# Patient Record
Sex: Female | Born: 1970
Health system: Southern US, Community
[De-identification: ages and names within clinical notes are randomized; demographics above are authoritative.]

## PROBLEM LIST (undated history)

## (undated) DIAGNOSIS — F32A Depression, unspecified: Secondary | ICD-10-CM

## (undated) DIAGNOSIS — I8390 Asymptomatic varicose veins of unspecified lower extremity: Secondary | ICD-10-CM

## (undated) DIAGNOSIS — Z8659 Personal history of other mental and behavioral disorders: Secondary | ICD-10-CM

## (undated) DIAGNOSIS — Z87820 Personal history of traumatic brain injury: Secondary | ICD-10-CM

## (undated) DIAGNOSIS — Z87898 Personal history of other specified conditions: Secondary | ICD-10-CM

## (undated) DIAGNOSIS — F329 Major depressive disorder, single episode, unspecified: Secondary | ICD-10-CM

## (undated) DIAGNOSIS — F419 Anxiety disorder, unspecified: Secondary | ICD-10-CM

## (undated) DIAGNOSIS — A048 Other specified bacterial intestinal infections: Secondary | ICD-10-CM

## (undated) DIAGNOSIS — N946 Dysmenorrhea, unspecified: Secondary | ICD-10-CM

## (undated) DIAGNOSIS — Z8719 Personal history of other diseases of the digestive system: Secondary | ICD-10-CM

## (undated) HISTORY — PX: TRANSTHORACIC ECHOCARDIOGRAM: SHX275

---

## 2000-06-04 HISTORY — PX: LAPAROSCOPIC OVARIAN CYSTECTOMY: SUR786

## 2002-04-16 ENCOUNTER — Inpatient Hospital Stay (HOSPITAL_COMMUNITY): Admission: AD | Admit: 2002-04-16 | Discharge: 2002-04-16 | Payer: Self-pay | Admitting: *Deleted

## 2002-12-14 ENCOUNTER — Encounter: Admission: RE | Admit: 2002-12-14 | Discharge: 2002-12-14 | Payer: Self-pay | Admitting: Family Medicine

## 2002-12-14 ENCOUNTER — Encounter: Payer: Self-pay | Admitting: Family Medicine

## 2004-12-26 ENCOUNTER — Other Ambulatory Visit: Admission: RE | Admit: 2004-12-26 | Discharge: 2004-12-26 | Payer: Self-pay | Admitting: Obstetrics and Gynecology

## 2007-01-02 ENCOUNTER — Encounter: Admission: RE | Admit: 2007-01-02 | Discharge: 2007-01-02 | Payer: Self-pay | Admitting: Gastroenterology

## 2007-06-05 HISTORY — PX: BUNIONECTOMY: SHX129

## 2008-07-22 ENCOUNTER — Encounter: Admission: RE | Admit: 2008-07-22 | Discharge: 2008-07-22 | Payer: Self-pay | Admitting: Podiatry

## 2009-02-15 ENCOUNTER — Encounter: Admission: RE | Admit: 2009-02-15 | Discharge: 2009-02-15 | Payer: Self-pay | Admitting: Podiatry

## 2010-03-20 ENCOUNTER — Encounter: Admission: RE | Admit: 2010-03-20 | Discharge: 2010-03-20 | Payer: Self-pay | Admitting: Family Medicine

## 2010-03-29 ENCOUNTER — Ambulatory Visit (HOSPITAL_COMMUNITY): Admission: RE | Admit: 2010-03-29 | Discharge: 2010-03-29 | Payer: Self-pay | Admitting: Family Medicine

## 2010-05-11 ENCOUNTER — Emergency Department (HOSPITAL_COMMUNITY): Admission: EM | Admit: 2010-05-11 | Discharge: 2010-03-24 | Payer: Self-pay | Admitting: Emergency Medicine

## 2010-08-16 LAB — DIFFERENTIAL
Basophils Absolute: 0 10*3/uL (ref 0.0–0.1)
Basophils Relative: 0 % (ref 0–1)
Eosinophils Absolute: 0.2 10*3/uL (ref 0.0–0.7)
Eosinophils Relative: 1 % (ref 0–5)
Lymphocytes Relative: 5 % — ABNORMAL LOW (ref 12–46)
Lymphs Abs: 1.2 10*3/uL (ref 0.7–4.0)
Monocytes Absolute: 1 10*3/uL (ref 0.1–1.0)
Monocytes Relative: 5 % (ref 3–12)
Neutro Abs: 19.5 10*3/uL — ABNORMAL HIGH (ref 1.7–7.7)
Neutrophils Relative %: 89 % — ABNORMAL HIGH (ref 43–77)

## 2010-08-16 LAB — COMPREHENSIVE METABOLIC PANEL
ALT: 23 U/L (ref 0–35)
AST: 19 U/L (ref 0–37)
Albumin: 4.5 g/dL (ref 3.5–5.2)
Alkaline Phosphatase: 48 U/L (ref 39–117)
BUN: 11 mg/dL (ref 6–23)
CO2: 26 mEq/L (ref 19–32)
Calcium: 9.5 mg/dL (ref 8.4–10.5)
Chloride: 106 mEq/L (ref 96–112)
Creatinine, Ser: 0.99 mg/dL (ref 0.4–1.2)
GFR calc Af Amer: >60 mL/min (ref >60)
GFR calc non Af Amer: >60 mL/min (ref >60)
Glucose, Bld: 138 mg/dL — ABNORMAL HIGH (ref 70–99)
Potassium: 4.1 mEq/L (ref 3.5–5.1)
Sodium: 139 mEq/L (ref 135–145)
Total Bilirubin: 0.6 mg/dL (ref 0.3–1.2)
Total Protein: 7.4 g/dL (ref 6.0–8.3)

## 2010-08-16 LAB — URINALYSIS, ROUTINE W REFLEX MICROSCOPIC
Bilirubin Urine: NEGATIVE
Glucose, UA: NEGATIVE mg/dL
Ketones, ur: NEGATIVE mg/dL
Leukocytes, UA: NEGATIVE
Nitrite: NEGATIVE
Protein, ur: NEGATIVE mg/dL
Specific Gravity, Urine: 1.009 (ref 1.005–1.030)
Urobilinogen, UA: 0.2 mg/dL (ref 0.0–1.0)
pH: 7 (ref 5.0–8.0)

## 2010-08-16 LAB — CBC
HCT: 43.2 % (ref 36.0–46.0)
Hemoglobin: 15.3 g/dL — ABNORMAL HIGH (ref 12.0–15.0)
MCH: 32 pg (ref 26.0–34.0)
MCHC: 35.5 g/dL (ref 30.0–36.0)
MCV: 90.1 fL (ref 78.0–100.0)
Platelets: 270 10*3/uL (ref 150–400)
RBC: 4.8 MIL/uL (ref 3.87–5.11)
RDW: 12.2 % (ref 11.5–15.5)
WBC: 21.8 10*3/uL — ABNORMAL HIGH (ref 4.0–10.5)

## 2010-08-16 LAB — URINE MICROSCOPIC-ADD ON

## 2010-08-16 LAB — POCT PREGNANCY, URINE: Preg Test, Ur: NEGATIVE

## 2010-08-16 LAB — LIPASE, BLOOD: Lipase: 31 U/L (ref 11–59)

## 2011-01-16 ENCOUNTER — Encounter: Payer: Self-pay | Admitting: Vascular Surgery

## 2011-01-24 ENCOUNTER — Ambulatory Visit (INDEPENDENT_AMBULATORY_CARE_PROVIDER_SITE_OTHER): Payer: 59 | Admitting: Vascular Surgery

## 2011-01-24 ENCOUNTER — Encounter: Payer: Self-pay | Admitting: Vascular Surgery

## 2011-01-24 ENCOUNTER — Ambulatory Visit (INDEPENDENT_AMBULATORY_CARE_PROVIDER_SITE_OTHER): Payer: 59

## 2011-01-24 VITALS — BP 128/71 | HR 71 | Resp 18 | Ht 66.0 in | Wt 169.0 lb

## 2011-01-24 DIAGNOSIS — I8310 Varicose veins of unspecified lower extremity with inflammation: Secondary | ICD-10-CM

## 2011-01-24 DIAGNOSIS — I83893 Varicose veins of bilateral lower extremities with other complications: Secondary | ICD-10-CM

## 2011-01-24 NOTE — Progress Notes (Signed)
Subjective:     Patient ID: Taylor Knight, female   DOB: 09-Feb-1971, 40 y.o.   MRN: [id number]  HPI The she reports that the pain is worse with prolonged standing and she has an akinesis sensation specifically over the veins patient presents with complaint of pain in her right leg related to varicose veins. These varicosities had been present for many years and over the past several months have become more painful. This is particularly painful in her right medial thigh and right medial calf. She has no history of DVT or bleeding. She has no history of superficial thrombophlebitis. She reports this is worse with prolonged standing and is specifically sensitive over the veins in her medial thigh. She has worn knee-high support hose with no relief. She does elevate her legs when possible.  Review of Systems Positive for headache, nosebleed otherwise review of systems is negative    Objective:   Physical Exam Well-developed well-nourished white female no acute distress HEENT is normal pulse status 2+ radial and dorsalis pedis pulses. Musculoskeletal no major deformities or cyanosis. Neurologic, no focal weakness paresthesias. Skin: No ulcers or rashes. Marked varicosities in the right medial thigh and medial calf.  Largely venous duplex exam: Reflux and enlarged a great saphenous vein from the saphenofemoral junction distally below the knee on the right and to above the knee on the left. No significant deep venous reflux.    Assessment:     Symptomatic right leg varicose veins related to saphenous vein reflux. Patient is fitted today with thigh high 20-30 mm mercury graduated compression garments.    Plan:     The patient understands treatment plan with elevation ibuprofen and compression. She will be seen again to determine if this is effective treatment in 3 months. She would be a candidate for laser ablation of her right great saphenous vein and stab phlebectomy of tributary varicosities should she  failed conservative treatment.

## 2011-01-30 NOTE — Procedures (Unsigned)
LOWER EXTREMITY VENOUS REFLUX EXAM  INDICATION:  Bilateral varicose veins with pain.  EXAM:  Using color-flow imaging and pulse Doppler spectral analysis, the right and left common femoral, superficial femoral, popliteal, posterior tibial, greater and lesser saphenous veins are evaluated.  There is evidence suggesting deep venous insufficiency in the right and left lower extremities at the common femoral vein level.  The right and left saphenofemoral junctions are not competent with Reflux of >521milliseconds. The right and left GSV's are not competent with Reflux of >557milliseconds with the caliber as described below.  The right and left proximal short saphenous veins demonstrate competency.  GSV Diameter (used if found to be incompetent only)                                           Right    Left Proximal Greater Saphenous Vein           0.79 cm  0.44 cm Proximal-to-mid-thigh                     0.50 cm  0.29 cm Mid thigh                                 0.30 cm  0.27 cm Mid-distal thigh                          cm       cm Distal thigh                              0.33 cm  0.16 cm Knee                                      0.16 cm  0.10 cm   IMPRESSION: 1. The right and left great saphenous vein is not competent with     reflux >593milliseconds. 2. The right and left great saphenous veins are not tortuous. 3. The deep venous system is not competent with Reflux of     >528milliseconds. 4. The right and left lesser saphenous vein is competent.        ___________________________________________ Larina Earthly, M.D.  EM/MEDQ  D:  01/24/2011  T:  01/24/2011  Job:  [id number]

## 2011-04-30 ENCOUNTER — Encounter: Payer: Self-pay | Admitting: Vascular Surgery

## 2011-05-01 ENCOUNTER — Encounter: Payer: Self-pay | Admitting: Vascular Surgery

## 2011-05-01 ENCOUNTER — Ambulatory Visit (INDEPENDENT_AMBULATORY_CARE_PROVIDER_SITE_OTHER): Payer: 59 | Admitting: Vascular Surgery

## 2011-05-01 VITALS — BP 137/88 | HR 85 | Resp 18 | Ht 66.0 in | Wt 165.4 lb

## 2011-05-01 DIAGNOSIS — I83893 Varicose veins of bilateral lower extremities with other complications: Secondary | ICD-10-CM

## 2011-05-01 NOTE — Progress Notes (Signed)
Problems with Activities of Daily Living Secondary to Leg Pain  1. 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  2. Mrs. Leiterman states cooking, cleaning, and shopping are difficult for her due to leg pain.    Rankin, Neena Rhymes   Failure of  Conservative Therapy:  1. Worn 20-30 mm Hg thigh high compression hose >3 months with no relief of symptoms.  2. Frequently elevates legs-no relief of symptoms  3. Taken Ibuprofen 600 Mg TID with no relief of symptoms.  The patient presents today for followup of her bilateral venous varicosities. She has had no improvement with conservative therapy consisting of compression and elevation and ibuprofen. She does have large tributary varicosities bilaterally which are causing her significant pain. I have recommended staged bilateral laser ablation of her great saphenous vein and stab phlebectomy of tributary varicosities were pain relief. She wishes to proceed as soon as possible.

## 2011-05-07 ENCOUNTER — Other Ambulatory Visit: Payer: Self-pay | Admitting: *Deleted

## 2011-05-07 DIAGNOSIS — I83893 Varicose veins of bilateral lower extremities with other complications: Secondary | ICD-10-CM

## 2011-06-06 ENCOUNTER — Encounter: Payer: Self-pay | Admitting: Vascular Surgery

## 2011-06-07 ENCOUNTER — Ambulatory Visit (INDEPENDENT_AMBULATORY_CARE_PROVIDER_SITE_OTHER): Payer: 59 | Admitting: Vascular Surgery

## 2011-06-07 ENCOUNTER — Encounter: Payer: Self-pay | Admitting: Vascular Surgery

## 2011-06-07 VITALS — BP 134/80 | HR 91 | Resp 18 | Ht 66.0 in | Wt 160.0 lb

## 2011-06-07 DIAGNOSIS — I83893 Varicose veins of bilateral lower extremities with other complications: Secondary | ICD-10-CM | POA: Insufficient documentation

## 2011-06-07 HISTORY — PX: VARICOSE VEIN SURGERY: SHX832

## 2011-06-07 NOTE — Progress Notes (Signed)
Laser Ablation Procedure      Date: 06/07/2011    Taylor Knight DOB:1970/08/14  Consent signed: Yes  Surgeon:T.F. Franziska Podgurski  Procedure: Laser Ablation: right Greater Saphenous Vein  BP 134/80  Pulse 91  Resp 18  Ht 5\' 6"  (1.676 m)  Wt 160 lb (72.576 kg)  BMI 25.82 kg/m2  Start time: 11:15 AM   End time: 12:30 PM  Tumescent Anesthesia: 480 cc 0.9% NaCl with 50 cc Lidocaine HCL with 1% Epi and 15 cc 8.4% NaHCO3  Local Anesthesia: 3 cc Lidocaine HCL and NaHCO3 (ratio 2:1)  Continuous Mode: 15 Watts Total Energy 1715 Joules Total Time1: 54     Stab Phlebectomy: 10-20 Sites: Thigh and Calf  Patient tolerated procedure well: Yes  Taylor Knight, Taylor Knight  Description of Procedure:  After marking the course of the saphenous vein and the secondary varicosities in the standing position, the patient was placed on the operating table in the supine position, and the right leg was prepped and draped in sterile fashion. Local anesthetic was administered, and under ultrasound guidance the saphenous vein was accessed with a micro needle and guide wire; then the micro puncture sheath was placed. A guide wire was inserted to the saphenofemoral junction, followed by a 5 french sheath.  The position of the sheath and then the laser fiber below the junction was confirmed using the ultrasound and visualization of the aiming beam.  Tumescent anesthesia was administered along the course of the saphenous vein using ultrasound guidance. Protective laser glasses were placed on the patient, and the laser was fired at at 15 watt continuous mode.  For a total of 1715 joules.  A steri strip was applied to the puncture site.  The patient was then put into Trendelenburg position.  Local anesthetic was utilized overlying the marked varicosities.  Greater than 10-20 stab wounds were made using the tip of an 11 blade; and using the vein hook,  The phlebectomies were performed using a hemostat to avulse these varicosities.   Adequate hemostasis was achieved, and steri strips were applied to the stab wound.      ABD pads and thigh high compression stockings were applied.  Ace wrap bandages were applied over the phlebectomy sites and at the top of the saphenofemoral junction.  Blood loss was less than 15 cc.  The patient ambulated out of the operating room having tolerated the procedure well.

## 2011-06-08 ENCOUNTER — Encounter: Payer: Self-pay | Admitting: Vascular Surgery

## 2011-06-12 ENCOUNTER — Telehealth: Payer: Self-pay | Admitting: *Deleted

## 2011-06-12 NOTE — Telephone Encounter (Deleted)
Laser Ablation Procedure      Date: 06/12/2011    Taylor Knight DOB:Mar 27, 1971  Consent signed: {yes/no:20286}  Surgeon:{VVS DOCTORS:21022260}  Procedure: Laser Ablation: {Right/Left/Bilat:20232} {VVS GREATER/SMALL:21022274} Saphenous Vein  @VS @  Start time: ***   End time: ***  Tumescent Anesthesia: *** cc 0.9% NaCl with 50 cc Lidocaine HCL with 1% Epi and 15 cc 8.4% NaHCO3  Local Anesthesia: *** cc Lidocaine HCL and NaHCO3 (ratio 2:1)  {VVS WATTS 4:69629528}   Sclerotherapy: *** %Sotradecol. Patient received a total of *** cc  Stab Phlebectomy: *** Sites: {VVS SITES:21022275}  Patient tolerated procedure well: {yes/no:20286}  Notes: ***  Description of Procedure:  After marking the course of the saphenous vein and the secondary varicosities in the standing position, the patient was placed on the operating table in the {DESC; PRONE / SUPINE / LATERAL:19389} position, and {Anatomy; leg/right/left/both:19201} was prepped and draped in sterile fashion. Local anesthetic was administered, and under ultrasound guidance the saphenous vein was accessed with a micro needle and guide wire; then the micro puncture sheath was placed. A guide wire was inserted to the {VEIN:21022325} junction, followed by a 5 french sheath.  The position of the sheath and then the laser fiber below the junction was confirmed using the ultrasound and visualization of the aiming beam.  Tumescent anesthesia was administered along the course of the saphenous vein using ultrasound guidance. Protective laser glasses were placed on the patient, and the laser was fired at {VVS WATTS:21022326}.  For a total of *** joules.  A steri strip was applied to the puncture site.  The patient was then put into Trendelenburg position.  Local anesthetic was utilized overlying the marked varicosities.  Greater than *** stab wounds were made using the tip of an 11 blade; and using the vein hook,  The phlebectomies were performed using a  hemostat to avulse these varicosities.  Adequate hemostasis was achieved, and steri strips were applied to the stab wound.    Sclerotherapy was performed to *** perforator vessels using ***  cc .3% Sotradecol foam via a 27g butterfly needle.  ABD pads and thigh high compression stockings were applied.  Ace wrap bandages were applied over the phlebectomy sites and at the top of the {VEIN:21022325} junction.  Blood loss was less than 15 cc.  The patient ambulated out of the operating room having tolerated the procedure well.

## 2011-06-12 NOTE — Telephone Encounter (Signed)
06/12/2011  Time: 11:11 AM   Patient Name: Taylor Knight  Patient of: T.F. Early  Procedure:Laser Ablation right greater saphenous vein and stab phlebectomy 10-20 right leg  Reached patient at home and checked  Her status  Yes    Comments/Actions Taken: [name] complained of pain in right calf (near phlebectomy site) yesterday relieved by Aleve. No complaints of swelling, bleeding/oozing, or pain today. [name] does not want to take oral medication prn pain so recommended cold compress to affected areas if needed for pain relief. Rankin, Neena Rhymes      @SIGNATURE @

## 2011-06-12 NOTE — Telephone Encounter (Signed)
This screen was opened in error.  Taylor Knight, Taylor Knight

## 2011-06-13 ENCOUNTER — Encounter: Payer: Self-pay | Admitting: Vascular Surgery

## 2011-06-14 ENCOUNTER — Encounter: Payer: Self-pay | Admitting: Vascular Surgery

## 2011-06-14 ENCOUNTER — Other Ambulatory Visit (INDEPENDENT_AMBULATORY_CARE_PROVIDER_SITE_OTHER): Payer: 59 | Admitting: *Deleted

## 2011-06-14 ENCOUNTER — Ambulatory Visit (INDEPENDENT_AMBULATORY_CARE_PROVIDER_SITE_OTHER): Payer: 59 | Admitting: Vascular Surgery

## 2011-06-14 VITALS — BP 116/72 | HR 81 | Resp 18 | Ht 66.0 in | Wt 160.0 lb

## 2011-06-14 DIAGNOSIS — I83893 Varicose veins of bilateral lower extremities with other complications: Secondary | ICD-10-CM

## 2011-06-14 DIAGNOSIS — Z48812 Encounter for surgical aftercare following surgery on the circulatory system: Secondary | ICD-10-CM

## 2011-06-14 NOTE — Progress Notes (Signed)
The patient presents today one week followup after laser ablation and stab phlebectomy of her right great saphenous vein and tributary varicosities he has the usual of soreness at the ablation site. Had minimal bruising. She has been compliant with her compression.  Venous duplex today reveals successful ablation of her great saphenous vein on the right was no evidence of deep venous injury. Quite please with her initial results. She will wear compression for one additional week. She will follow up with [place] on an as-needed basis. She does have known reflux on the left saphenous vein was minimal difficulty at this time. She will notify should she develop more significant problems in the future

## 2011-07-03 ENCOUNTER — Ambulatory Visit: Payer: 59 | Admitting: Physician Assistant

## 2011-07-03 ENCOUNTER — Ambulatory Visit (INDEPENDENT_AMBULATORY_CARE_PROVIDER_SITE_OTHER): Payer: 59 | Admitting: Internal Medicine

## 2011-07-03 DIAGNOSIS — J40 Bronchitis, not specified as acute or chronic: Secondary | ICD-10-CM

## 2011-07-03 DIAGNOSIS — R0989 Other specified symptoms and signs involving the circulatory and respiratory systems: Secondary | ICD-10-CM

## 2011-07-03 DIAGNOSIS — R0689 Other abnormalities of breathing: Secondary | ICD-10-CM

## 2011-07-03 LAB — POCT CBC
Granulocyte percent: 71.8
HCT, POC: 46.9
Hemoglobin: 15.5
Lymph, poc: 23.3 %L (ref 10–50)
MCH, POC: 30.8
MCHC: 33
MCV: 93.2
MID (cbc): 0.8 %M (ref 0–0.9)
MPV: 8.6
Platelet Count, POC: 353
RBC: 5.03
RDW, POC: 12.5 % (ref 11.6–14.8)
WBC: 16.3

## 2011-07-03 MED ORDER — AZITHROMYCIN 500 MG PO TABS
500.0000 mg | ORAL_TABLET | Freq: Every day | ORAL | Status: AC
Start: 2011-07-03 — End: 2011-07-08

## 2011-07-03 MED ORDER — HYDROCODONE-ACETAMINOPHEN 7.5-500 MG/15ML PO SOLN: 5.0000 mL | Freq: Four times a day (QID) | ORAL | Status: AC | PRN

## 2011-07-03 NOTE — Progress Notes (Signed)
Subjective:     Patient ID: Taylor Knight, female   DOB: [birth year], 41 y.o.   MRN: [id number]  Cough This is a new problem. The current episode started in the past 7 days. The problem has been gradually worsening. The problem occurs every few minutes. The cough is productive of sputum. Associated symptoms include chest pain, a fever, nasal congestion and rhinorrhea. Risk factors for lung disease include smoking/tobacco exposure. She has tried OTC cough suppressant, prescription cough suppressant and a beta-agonist inhaler for the symptoms. The treatment provided no relief.    This is a 41 year old female smoker with a persistent cough after one week who is complaining of progressive chest pain fever and fatigue.  Will order chest x-ray and CBC.  Review of Systems  Constitutional: Positive for fever.  HENT: Positive for rhinorrhea.   Respiratory: Positive for cough.   Cardiovascular: Positive for chest pain.       Objective:   Physical Exam  Constitutional: She appears well-developed and well-nourished.  HENT:  Right Ear: External ear normal.  Left Ear: External ear normal.  Nose: Nose normal.  Mouth/Throat: Oropharynx is clear and moist.       Nose is red swollen with exudate.  Sinuses on the right maxillary and frontal sinuses are tender to percussion  Neck: Neck supple.  Cardiovascular: Normal rate and regular rhythm.   Pulmonary/Chest: Effort normal. No respiratory distress. She has wheezes. She has no rales. She exhibits no tenderness.       Patient has rhonchorous cough and chest pain.  Chest pain comes with coughing.   [facility] reading (PRIMARY) by  Dr. Verlon Au clear.    Assessment:     Brochitis with a wbc of 16000+, no pneumonia on cxr    Plan:     zithromax 500 5day Lortab Elixi8r Return if not better

## 2011-07-05 NOTE — Procedures (Unsigned)
DUPLEX DEEP VENOUS EXAM - LOWER EXTREMITY  INDICATION:  One week followup, right great saphenous vein ablation.  HISTORY:  Edema:  Yes. Trauma/Surgery:  EVLT. Pain:  Yes. PE:  No. Previous DVT:  No. Anticoagulants:  No. Other:  DUPLEX EXAM:               CFV   SFV   PopV  PTV    GSV               R  L  R  L  R  L  R   L  R  L Thrombosis    o     o     o     o      + Spontaneous   +     +     +     +      o Phasic        +     +     +     +      o Augmentation  +     +     +     +      o Compressible  +     +     +     +      o Competent  Legend:  + - yes  o - no  p - partial  D - decreased  IMPRESSION:  Successful ablation of right great saphenous vein of approximately 1.36 cm from the junction through the distal insertion point without evidence of deep venous involvement.   _____________________________ Larina Earthly, M.D.  LT/MEDQ  D:  06/15/2011  T:  06/16/2011  Job:  [id number]

## 2011-07-09 ENCOUNTER — Telehealth: Payer: Self-pay

## 2011-07-09 NOTE — Telephone Encounter (Signed)
.  [facility]    Meds not helping  Patient,please advise    Best phone # [phone]  Pharmacy [facility]

## 2011-07-09 NOTE — Telephone Encounter (Signed)
Spoke with patient, has finished abx... Still having dry cough and alittle SOB.  Using cough syrup q hs, and inhaler/Mucinex during the day without much help.  Can we rx daytime cough medicine to help with cough/sore throat?

## 2011-07-11 ENCOUNTER — Ambulatory Visit (INDEPENDENT_AMBULATORY_CARE_PROVIDER_SITE_OTHER): Payer: 59 | Admitting: Family Medicine

## 2011-07-11 ENCOUNTER — Encounter: Payer: Self-pay | Admitting: Family Medicine

## 2011-07-11 VITALS — BP 124/76 | HR 68 | Temp 98.1°F | Resp 16 | Ht 66.25 in | Wt 158.4 lb

## 2011-07-11 DIAGNOSIS — J40 Bronchitis, not specified as acute or chronic: Secondary | ICD-10-CM

## 2011-07-11 DIAGNOSIS — R05 Cough: Secondary | ICD-10-CM

## 2011-07-11 DIAGNOSIS — R059 Cough, unspecified: Secondary | ICD-10-CM

## 2011-07-11 DIAGNOSIS — D72829 Elevated white blood cell count, unspecified: Secondary | ICD-10-CM

## 2011-07-11 LAB — POCT CBC
Granulocyte percent: 57.8 %G (ref 37–80)
HCT, POC: 45.6 % (ref 37.7–47.9)
Hemoglobin: 15 g/dL (ref 12.2–16.2)
Lymph, poc: 4 — AB (ref 0.6–3.4)
MCH, POC: 30.7 pg (ref 27–31.2)
MCHC: 32.9 g/dL (ref 31.8–35.4)
MCV: 93.3 fL (ref 80–97)
MID (cbc): 1.1 — AB (ref 0–0.9)
MPV: 9.4 fL (ref 0–99.8)
POC Granulocyte: 7.1 — AB (ref 2–6.9)
POC LYMPH PERCENT: 33.1 %L (ref 10–50)
POC MID %: 9.1 %M (ref 0–12)
Platelet Count, POC: 311 10*3/uL (ref 142–424)
RBC: 4.89 M/uL (ref 4.04–5.48)
RDW, POC: 12.6 %
WBC: 12.2 10*3/uL — AB (ref 4.6–10.2)

## 2011-07-11 MED ORDER — HYDROCODONE-HOMATROPINE 5-1.5 MG/5ML PO SYRP: 5.0000 mL | ORAL_SOLUTION | Freq: Four times a day (QID) | ORAL | Status: DC | PRN

## 2011-07-11 MED ORDER — PREDNISONE 20 MG PO TABS: ORAL_TABLET | ORAL | Status: AC

## 2011-07-11 NOTE — Progress Notes (Signed)
  Subjective:    Patient ID: Taylor Knight, female    DOB: 08-Jul-1970, 41 y.o.   MRN: [id number]  HPI 41 yo female here to f/u URI.  Seen 1/29 here and dx with bronchitis.  Given Azithro 500 x 5d and lortab elixir.  WBC of 16,000 then.  Since: improved for about a day and then worse again today.  Hoarse, coughing, hurts in chest to cough.  Cough is dry.  No fever.  A little pain behind left ear.  No sore throat.  Smoker, but not during illness.  No history of asthma.  Had xray at last visit - normal.     Review of Systems Negative except as per HPI     Objective:   Physical Exam  Constitutional: She appears well-developed. No distress.  HENT:  Right Ear: Tympanic membrane, external ear and ear canal normal. Tympanic membrane is not injected, not scarred, not perforated, not erythematous, not retracted and not bulging.  Left Ear: Tympanic membrane, external ear and ear canal normal. Tympanic membrane is not injected, not scarred, not perforated, not erythematous, not retracted and not bulging.  Nose: No mucosal edema or rhinorrhea. Right sinus exhibits no maxillary sinus tenderness and no frontal sinus tenderness. Left sinus exhibits no maxillary sinus tenderness and no frontal sinus tenderness.  Mouth/Throat: Uvula is midline, oropharynx is clear and moist and mucous membranes are normal. No oropharyngeal exudate or tonsillar abscesses.  Cardiovascular: Normal rate, regular rhythm, normal heart sounds and intact distal pulses.   No murmur heard. Pulmonary/Chest: Effort normal and breath sounds normal. No respiratory distress. She has no wheezes. She has no rales.  Lymphadenopathy:       Head (right side): No submandibular and no preauricular adenopathy present.       Head (left side): No submandibular and no preauricular adenopathy present.       Right cervical: No superficial cervical and no posterior cervical adenopathy present.      Left cervical: No superficial cervical and no posterior  cervical adenopathy present.       Right: No supraclavicular adenopathy present.       Left: No supraclavicular adenopathy present.  Skin: Skin is warm and dry.   Results for orders placed in visit on 07/11/11  POCT CBC      Component Value Range   WBC 12.2 (*) 4.6 - 10.2 (K/uL)   Lymph, poc 4.0 (*) 0.6 - 3.4    POC LYMPH PERCENT 33.1  10 - 50 (%L)   MID (cbc) 1.1 (*) 0 - 0.9    POC MID % 9.1  0 - 12 (%M)   POC Granulocyte 7.1 (*) 2 - 6.9    Granulocyte percent 57.8  37 - 80 (%G)   RBC 4.89  4.04 - 5.48 (M/uL)   Hemoglobin 15.0  12.2 - 16.2 (g/dL)   HCT, POC 40.9  81.1 - 47.9 (%)   MCV 93.3  80 - 97 (fL)   MCH, POC 30.7  27 - 31.2 (pg)   MCHC 32.9  31.8 - 35.4 (g/dL)   RDW, POC 91.4     Platelet Count, POC 311  142 - 424 (K/uL)   MPV 9.4  0 - 99.8 (fL)          Assessment & Plan:  Bronchitis, leukocytosis Wbc trending down Has completed zmax.  Has refill of lortab elixir.  Pred taper.  See rx

## 2011-07-11 NOTE — Telephone Encounter (Signed)
Spoke with pt and let her know Rx would be called in and she should RTC if still SOB. Pt agreed and will come by after work tonight. Sheketia informed me that she has already called in Rx.

## 2011-07-11 NOTE — Telephone Encounter (Signed)
Please call in refill of Lortab elixir.  However, if patient still has SOB, needs re-eval today. csj

## 2011-07-11 NOTE — Telephone Encounter (Signed)
Pt notified and rx called into pharmacy.

## 2011-07-21 ENCOUNTER — Ambulatory Visit (INDEPENDENT_AMBULATORY_CARE_PROVIDER_SITE_OTHER): Payer: 59 | Admitting: Internal Medicine

## 2011-07-21 ENCOUNTER — Encounter: Payer: Self-pay | Admitting: Internal Medicine

## 2011-07-21 VITALS — BP 110/68 | HR 72 | Temp 98.1°F | Resp 16 | Ht 66.25 in | Wt 157.0 lb

## 2011-07-21 DIAGNOSIS — R059 Cough, unspecified: Secondary | ICD-10-CM

## 2011-07-21 DIAGNOSIS — R05 Cough: Secondary | ICD-10-CM

## 2011-07-21 DIAGNOSIS — J019 Acute sinusitis, unspecified: Secondary | ICD-10-CM

## 2011-07-21 MED ORDER — BENZONATATE 100 MG PO CAPS: 100.0000 mg | ORAL_CAPSULE | Freq: Three times a day (TID) | ORAL | Status: AC | PRN

## 2011-07-21 MED ORDER — MOXIFLOXACIN HCL 400 MG PO TABS: 400.0000 mg | ORAL_TABLET | Freq: Every day | ORAL | Status: AC

## 2011-07-21 MED ORDER — HYDROCODONE-HOMATROPINE 5-1.5 MG/5ML PO SYRP: 5.0000 mL | ORAL_SOLUTION | Freq: Four times a day (QID) | ORAL | Status: DC | PRN

## 2011-07-21 MED ORDER — BENZONATATE 100 MG PO CAPS: 100.0000 mg | ORAL_CAPSULE | Freq: Two times a day (BID) | ORAL | Status: DC | PRN

## 2011-07-21 NOTE — Progress Notes (Signed)
  Subjective:    Patient ID: Taylor Knight, female    DOB: 1971/04/06, 41 y.o.   MRN: [id number]  HPIhas had 2 recent illness visits for a similar illness. Has completed a round of Zithromax and prednisone was added on 2/6 for continuing cough. For the last 3 or 4 days she has relapsed and now has cough both day and night. This is affecting her work and sleep. There is no sputum with the cough. She does have a substernal discomfort with the cough. Yesterday she had bad headache and pressure. There is no sore throat. Appetite is fair. She is fatigued. Steroids made her feel crazy. There is no history of asthma. She is not currently having reflux. She does have spring allergies.    Review of Systems     Objective:   Physical Exam HEENT-ears are clear turbinates are boggy with some purulent discharge. Throat is clear there are no nodes. Chest is clear to auscultation and there is no wheezing on forced expiration. Eyes are clear the maxillary sinuses are tender to percussion       Assessment & Plan:  Problem #1 cough Problem #2 sinusitis-prolonged Problem #3 allergic rhinitis  She will continue Hycodan at bedtime. Add Tessalon Perles for daytime. Avelox 400 once a day for 10 days. If not well in. He did 2 weeks needs recheck and potentially repeat x-ray. She will start Zyrtec to cover spring allergies.

## 2011-11-10 ENCOUNTER — Ambulatory Visit (INDEPENDENT_AMBULATORY_CARE_PROVIDER_SITE_OTHER): Payer: 59 | Admitting: Family Medicine

## 2011-11-10 VITALS — BP 106/72 | HR 71 | Temp 98.1°F | Resp 18 | Ht 66.0 in | Wt 144.8 lb

## 2011-11-10 DIAGNOSIS — R059 Cough, unspecified: Secondary | ICD-10-CM

## 2011-11-10 DIAGNOSIS — J309 Allergic rhinitis, unspecified: Secondary | ICD-10-CM

## 2011-11-10 DIAGNOSIS — R05 Cough: Secondary | ICD-10-CM

## 2011-11-10 MED ORDER — AZITHROMYCIN 250 MG PO TABS: ORAL_TABLET | ORAL | Status: AC

## 2011-11-10 MED ORDER — ALBUTEROL SULFATE HFA 108 (90 BASE) MCG/ACT IN AERS: 2.0000 | INHALATION_SPRAY | Freq: Four times a day (QID) | RESPIRATORY_TRACT | Status: DC | PRN

## 2011-11-10 MED ORDER — BENZONATATE 100 MG PO CAPS: 100.0000 mg | ORAL_CAPSULE | Freq: Three times a day (TID) | ORAL | Status: AC | PRN

## 2011-11-10 MED ORDER — MOMETASONE FUROATE 50 MCG/ACT NA SUSP: 2.0000 | Freq: Every day | NASAL | Status: DC

## 2011-11-10 NOTE — Progress Notes (Signed)
  Subjective:    Patient ID: Taylor Knight, female    DOB: March 20, 1971, 41 y.o.   MRN: [id number]  HPI Taylor Knight is a 41 y.o. female Bronchitis in February.  Improved, until 3 weeks ago - cough, runny nose - [facility]zrx tessalon, antibiotic - possibly doxy, and hycodan syrup at night.   Still with runny nose and dry cough.  pnd - causing cough. Few days ago - wheeze with cough?.    Used allegra in past - not recently or with these sx's.   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 Less tobacco - 2 cigs per day.   Review of Systems  Constitutional: Negative for fever and chills.  HENT: Positive for congestion, rhinorrhea and postnasal drip.   Respiratory: Positive for cough and wheezing. Negative for shortness of breath.        Only sob with cough spell.        Objective:   Physical Exam  Constitutional: She is oriented to person, place, and time. She appears well-developed and well-nourished. No distress.  HENT:  Head: Normocephalic and atraumatic.  Right Ear: Hearing, tympanic membrane, external ear and ear canal normal.  Left Ear: Hearing, tympanic membrane, external ear and ear canal normal.  Nose: Mucosal edema and rhinorrhea present. Right sinus exhibits no maxillary sinus tenderness and no frontal sinus tenderness. Left sinus exhibits no maxillary sinus tenderness and no frontal sinus tenderness.  Mouth/Throat: Oropharynx is clear and moist. No oropharyngeal exudate.  Eyes: Conjunctivae and EOM are normal. Pupils are equal, round, and reactive to light.  Cardiovascular: Normal rate, regular rhythm, normal heart sounds and intact distal pulses.   No murmur heard. Pulmonary/Chest: Effort normal and breath sounds normal. No respiratory distress. She has no wheezes. She has no rhonchi.  Neurological: She is alert and oriented to person, place, and time.  Skin: Skin is warm and dry. No rash noted.  Psychiatric: She has a normal mood and affect. Her behavior is normal.           Assessment & Plan:  Taylor Knight is a 41 y.o. female  Cough - suspected ar as primary cause vs early bronchitis.  Restart allegra every day.  Add nasonex ns 2 sprays each day. - correct technique reviewed.  Saline ns during day prn. If not improving in 4-5 days, start Zpak.  If cough not improved in 7 to 10 days - recheck for possible follow up CXR. Tessalon TID  prn cough.   If any wheeze - can use proair, but none heard today.

## 2011-11-10 NOTE — Patient Instructions (Signed)
Restart allegra every day.  Add nasonex  -  2 sprays each day. Saline nasal spray atleast 4 times per day, drink plenty of fluids.  If not improving in 4-5 days, start Zpak(antibiotic)  If cough not improved in 7 to 10 days - recheck for possible follow up chest x ray    If any wheeze - can use inhaler, but if requiring this more than 1 - 2 times per day - recheck in office. Return to the clinic or go to the nearest emergency room if any of your symptoms worsen or new symptoms occur.

## 2011-12-21 ENCOUNTER — Encounter: Payer: Self-pay | Admitting: Family Medicine

## 2011-12-21 ENCOUNTER — Ambulatory Visit (INDEPENDENT_AMBULATORY_CARE_PROVIDER_SITE_OTHER): Payer: 59 | Admitting: Family Medicine

## 2011-12-21 VITALS — BP 112/76 | HR 83 | Temp 97.5°F | Resp 16 | Ht 66.0 in | Wt 144.0 lb

## 2011-12-21 DIAGNOSIS — F341 Dysthymic disorder: Secondary | ICD-10-CM

## 2011-12-21 DIAGNOSIS — F418 Other specified anxiety disorders: Secondary | ICD-10-CM

## 2011-12-21 LAB — TSH: TSH: 1.112 u[IU]/mL (ref 0.350–4.500)

## 2011-12-21 MED ORDER — CLONAZEPAM 0.5 MG PO TABS: 0.5000 mg | ORAL_TABLET | Freq: Two times a day (BID) | ORAL | Status: DC | PRN

## 2011-12-21 MED ORDER — FLUOXETINE HCL 20 MG PO TABS: 20.0000 mg | ORAL_TABLET | Freq: Every day | ORAL | Status: DC

## 2011-12-21 NOTE — Progress Notes (Signed)
  Subjective:    Patient ID: Taylor Knight, female    DOB: 1970/10/01, 41 y.o.   MRN: [id number]  HPI Taylor Knight is a 41 y.o. female Seen by therapist through employer ([employer] [facility]). S/p 2 visits - weekly.   Last seen 2 days ago. Anxiety, depression and episodic panic attacks.  Going on awhile - about a year, but worse recently.  Family issues and marital issues.  Plans on continuing therapy each week. Feels overwhelmed at work at time - stressed - lead to panic attack yesterday at work.  No SI, No Etoh, No IDU.  Occasional smoker.    Admits to hx of unsafe feelings at home at times.   Has information from [facility] and shelter available.    Review of Systems  Psychiatric/Behavioral: Positive for disturbed wake/sleep cycle and decreased concentration. Negative for suicidal ideas. The patient is nervous/anxious.        Objective:   Physical Exam  Constitutional: She is oriented to person, place, and time. She appears well-developed and well-nourished.  HENT:  Head: Normocephalic and atraumatic.  Neck: No thyromegaly present.  Cardiovascular: Normal rate, regular rhythm, normal heart sounds and intact distal pulses.   No murmur heard. Pulmonary/Chest: Effort normal and breath sounds normal.  Neurological: She is alert and oriented to person, place, and time.  Skin: Skin is warm and dry.  Psychiatric: Her speech is normal and behavior is normal. Judgment and thought content normal. Cognition and memory are normal. She exhibits a depressed mood. She expresses no suicidal ideation.       Tearful during OV. Good eye contact.  No PMA/PMR          Assessment & Plan:  [name] is a 41 y.o. female 1. Depression with anxiety  FLUoxetine (PROZAC) 20 MG tablet, TSH     Depression/anxiety - episodic panic attacks.  Longstanding with recent worsening. Start prozac 20mg  QD, # 30, 3 rf, klonopin 0.5mg  BID prn.  #30 counseled on relaxation  techniques, and encouraged exercise most days of week - currently 3d/week.  Recheck in 2 weeks. - sooner if any worsening.  Discussed home safety concerns - reinforced safety plan and [facility] shelter availabiility. Understanding expressed.

## 2012-01-04 ENCOUNTER — Encounter: Payer: Self-pay | Admitting: Family Medicine

## 2012-01-04 ENCOUNTER — Ambulatory Visit (INDEPENDENT_AMBULATORY_CARE_PROVIDER_SITE_OTHER): Payer: 59 | Admitting: Family Medicine

## 2012-01-04 VITALS — BP 110/60 | HR 72 | Temp 98.6°F | Resp 16 | Ht 66.0 in | Wt 142.0 lb

## 2012-01-04 DIAGNOSIS — F418 Other specified anxiety disorders: Secondary | ICD-10-CM

## 2012-01-04 DIAGNOSIS — F411 Generalized anxiety disorder: Secondary | ICD-10-CM

## 2012-01-04 MED ORDER — CLONAZEPAM 0.5 MG PO TABS: 0.5000 mg | ORAL_TABLET | Freq: Two times a day (BID) | ORAL | Status: DC | PRN
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
## 2012-02-01 ENCOUNTER — Ambulatory Visit (INDEPENDENT_AMBULATORY_CARE_PROVIDER_SITE_OTHER): Payer: 59 | Admitting: Family Medicine

## 2012-02-01 ENCOUNTER — Encounter: Payer: Self-pay | Admitting: Family Medicine

## 2012-02-01 VITALS — BP 106/68 | HR 79 | Temp 98.2°F | Resp 16 | Ht 66.0 in | Wt 139.0 lb

## 2012-02-01 DIAGNOSIS — F341 Dysthymic disorder: Secondary | ICD-10-CM

## 2012-02-01 DIAGNOSIS — F418 Other specified anxiety disorders: Secondary | ICD-10-CM

## 2012-02-01 MED ORDER — CLONAZEPAM 0.5 MG PO TABS: 0.5000 mg | ORAL_TABLET | Freq: Two times a day (BID) | ORAL | Status: DC | PRN

## 2012-02-01 NOTE — Patient Instructions (Signed)
Call me with an update in the next 2 weeks.  You should have atleast one refill left of the prozac left.  Can take klonopin up to 2 times per day. Return to the clinic or go to the nearest emergency room if any of your symptoms worsen or new symptoms occur.

## 2012-02-01 NOTE — Progress Notes (Signed)
  Subjective:    Patient ID: Taylor Knight, female    DOB: [birth year], 41 y.o.   MRN: [id number]  HPI Taylor Knight is a 41 y.o. female  Follow up for Depression/anxiety - see prior visits.    Started prozac 20mg  QD, # 30, 3 rf, klonopin 0.5mg  BID prn.  #30 on 12/21/11.  Last ov 01/04/12 - improved.  See ov re: safety concerns and plan.   Once per week visits with therapist.   As of last visit - improving on prozac,  making relaxed, things not bothering as much. dizzy with full pill of klonopin, but no other side effects.   Today - states lost job 2 weeks ago.  Business was slow.  8 people were let go on 8/12.  Bad timing.  Setback with depression.  Had been doing okay prior, but feeling more depressed and anxious since losing job.  Feels overwhelmed and difficulty with control.  Still meeting with therapist - each week.  Last visit 4 days ago.  Received information for job resource center.  No suicide thoughts, no alcohol.  Taking klonopin - full pill at night, no day time use. Taking prozac every day. Has been doing better with avoiding arguments with husband  - no safety concerns.    Consulting civil engineer, but Press photographer for unemployment.  Exercising every morning.     Review of Systems  Constitutional: Negative for fever and chills.  Psychiatric/Behavioral: Negative for suicidal ideas and dysphoric mood. The patient is nervous/anxious.        Objective:   Physical Exam  Constitutional: She appears well-developed and well-nourished.  Cardiovascular: Normal rate, regular rhythm, normal heart sounds and intact distal pulses.   Pulmonary/Chest: Effort normal.  Neurological: She is alert.  Psychiatric: She has a normal mood and affect. Her speech is normal and behavior is normal. Judgment and thought content normal. Cognition and memory are normal. She expresses no suicidal ideation.       Tearful during ov, but good eye contact.     Spent >75% of visit with counseling.     Assessment & Plan:   Taylor Knight is a 41 y.o. female 1. Depression with anxiety  clonazePAM (KLONOPIN) 0.5 MG tablet    Depression with anxiety, with recent increase in stressors. No SI.  Discussed stress mgt techniques and relaxation techniques, continue same dose prozac for now, but plan on phone call in next 2 weeks, and may need to increase dose to 40mg  at that time.  Continue Klonopin - but can take daytime dose as well - 1/2 to 1 BID prn.  #30 rx. Continue counseling - next therapist appt next week.  Patient Instructions  Call me with an update in the next 2 weeks.  You should have atleast one refill left of the prozac left.  Can take klonopin up to 2 times per day. Return to the clinic or go to the nearest emergency room if any of your symptoms worsen or new symptoms occur.

## 2012-02-07 NOTE — Progress Notes (Signed)
This encounter was created in error - please disregard.  This encounter was created in error - please disregard.

## 2012-02-20 ENCOUNTER — Telehealth: Payer: Self-pay

## 2012-02-20 NOTE — Telephone Encounter (Signed)
DR Neva Seat  PT STATES DEPRESSION AND ANXIETY MEDICATIONS HAVE NOT MADE HER FEEL ANY DIFFERENT   PLEASE ADVISE PT PHONE [phone]

## 2012-02-21 NOTE — Telephone Encounter (Signed)
LMOM to CB at both Cell and home #s.

## 2012-02-21 NOTE — Telephone Encounter (Signed)
How often is she taking the anxiolytic?  Still in counseling? She can increase the prozac to 40mg  each day (2 of her 20mg ). Plan follow up in next 4 weeks, sooner if any worsening of sx's.

## 2012-02-28 NOTE — Telephone Encounter (Signed)
LMOM on both #s again for pt to CB. 

## 2012-03-03 NOTE — Telephone Encounter (Signed)
Tried to call pt again and left message that we have tried calling several times, but have not been able to speak with her, and that I will send her a letter w/instr's from Dr Neva Seat. Printed and sent unable to reach letter w/instr's from Dr Neva Seat.

## 2012-03-06 NOTE — Telephone Encounter (Signed)
Please try to call patient again and check status.

## 2012-03-06 NOTE — Telephone Encounter (Signed)
LMOM on cell # to CB. Memory full on H# and could not LM. We do not have permission on HIPPA to call at work.

## 2012-03-07 NOTE — Telephone Encounter (Signed)
LMOM on cell # asking pt to CB to let us know if she received our letter w/Dr Greene's instr's and to let us know how she is doing.

## 2012-03-07 NOTE — Telephone Encounter (Signed)
Discussed w/Dr Neva Seat continuing inability to get in touch w/pt and he instructed to try to call an emergency contact/spouse. Pt's husband is listed as her emergency contact w/same H# that will not accept VM, but was able to find a W# in paper chart. Called the [employer] he normally works at on [employer] and Production designer, theatre/television/film stated he is not working today but he may be at the [employer] location. The Manager at [employer] stated that husband is off today, but he believes he will be working on [employer].

## 2012-03-08 ENCOUNTER — Telehealth: Payer: Self-pay

## 2012-03-08 NOTE — Telephone Encounter (Signed)
Called pt. Note left on TL desk to call to check on pt to see if she received instructions on her medications from Dr Neva Seat. Home number rang but unable to leave message. LMOM on cell phone to CB. Called husbands job

## 2012-03-10 NOTE — Telephone Encounter (Signed)
No.  Cont same meds, follow up in next few weeks.

## 2012-03-10 NOTE — Telephone Encounter (Signed)
Pt CB and she thanked [place] for calling and trying to reach her. She stated she tried to call us back several times, but she just has so much going on. Pt reports that she did receive our letter w/instr's to inc her Prozac to 40 mg, which she had already done about 2 weeks ago because that was discussed at her OV w/Dr Neva Seat. She still has not seen any improvement. Pt has only been taking the Klonopin at night to see if she can sleep, because it makes her tired during the day. Pt reports that the last week 1/2 it has not even been helping her at night, she wakes about Q hr. Pt states she has noticed her hands shaking from anxiety and sometimes notices she is breathing heavier. Pt does say that she got another job and the ins starts this Wed. Transferred her to 104 to set up appt w/Dr Neva Seat.  Dr Neva Seat, do you want to make any changes in pt's meds before her appt?

## 2012-03-10 NOTE — Telephone Encounter (Signed)
Called left message for her to advise.  

## 2012-03-19 ENCOUNTER — Other Ambulatory Visit: Payer: Self-pay | Admitting: Family Medicine

## 2012-03-19 ENCOUNTER — Telehealth: Payer: Self-pay

## 2012-03-19 DIAGNOSIS — F418 Other specified anxiety disorders: Secondary | ICD-10-CM

## 2012-03-19 MED ORDER — FLUOXETINE HCL 20 MG PO TABS: 20.0000 mg | ORAL_TABLET | Freq: Every day | ORAL | Status: DC

## 2012-03-19 MED ORDER — CLONAZEPAM 0.5 MG PO TABS: 0.5000 mg | ORAL_TABLET | Freq: Two times a day (BID) | ORAL | Status: DC | PRN

## 2012-03-19 NOTE — Telephone Encounter (Signed)
Faxed in Rx for Klonopin. Advised pt RX's sent to pharmacy. Pt understood

## 2012-03-19 NOTE — Telephone Encounter (Signed)
Signed and sent, prescription at desk

## 2012-03-19 NOTE — Telephone Encounter (Signed)
Have pended these, have gotten fax also, the pharmacy must not know how to send surescripts.

## 2012-03-19 NOTE — Telephone Encounter (Signed)
Needs more meds  FLUoxetine (PROZAC) 20 MG tablet clonazePAM (KLONOPIN) 0.5 MG tablet    [phone]  Work number (this is correct)

## 2012-03-24 ENCOUNTER — Ambulatory Visit (INDEPENDENT_AMBULATORY_CARE_PROVIDER_SITE_OTHER): Payer: 59 | Admitting: Family Medicine

## 2012-03-24 VITALS — BP 100/74 | HR 64 | Temp 98.2°F | Resp 16 | Ht 66.0 in | Wt 144.4 lb

## 2012-03-24 DIAGNOSIS — F411 Generalized anxiety disorder: Secondary | ICD-10-CM

## 2012-03-24 DIAGNOSIS — F418 Other specified anxiety disorders: Secondary | ICD-10-CM | POA: Insufficient documentation

## 2012-03-24 MED ORDER — FLUOXETINE HCL 40 MG PO CAPS: 40.0000 mg | ORAL_CAPSULE | Freq: Every day | ORAL | Status: DC

## 2012-03-24 NOTE — Patient Instructions (Addendum)
Take prozac in the morning, klonopin up to 2 twice per day.  Recheck in 2 weeks.
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
## 2012-04-04 DIAGNOSIS — Z0271 Encounter for disability determination: Secondary | ICD-10-CM

## 2012-04-07 ENCOUNTER — Ambulatory Visit: Payer: 59 | Admitting: Family Medicine

## 2012-04-07 ENCOUNTER — Telehealth: Payer: Self-pay

## 2012-04-07 NOTE — Telephone Encounter (Signed)
Telephone call to patient per Dr. Neva Seat to check on patient's status due to missed appointment today.  No answer.  Left message on voice mail for patient to call office to reschedule appointment.

## 2012-04-14 ENCOUNTER — Ambulatory Visit (INDEPENDENT_AMBULATORY_CARE_PROVIDER_SITE_OTHER): Payer: 59 | Admitting: Family Medicine

## 2012-04-14 ENCOUNTER — Encounter: Payer: Self-pay | Admitting: Family Medicine

## 2012-04-14 VITALS — BP 132/90 | HR 78 | Temp 99.4°F | Resp 16 | Ht 66.0 in | Wt 149.0 lb

## 2012-04-14 DIAGNOSIS — R079 Chest pain, unspecified: Secondary | ICD-10-CM

## 2012-04-14 DIAGNOSIS — F32A Depression, unspecified: Secondary | ICD-10-CM

## 2012-04-14 DIAGNOSIS — F329 Major depressive disorder, single episode, unspecified: Secondary | ICD-10-CM

## 2012-04-14 DIAGNOSIS — F419 Anxiety disorder, unspecified: Secondary | ICD-10-CM

## 2012-04-14 DIAGNOSIS — F411 Generalized anxiety disorder: Secondary | ICD-10-CM

## 2012-04-14 MED ORDER — ALPRAZOLAM 0.25 MG PO TABS: 0.5000 mg | ORAL_TABLET | Freq: Three times a day (TID) | ORAL | Status: DC | PRN
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
## 2012-04-14 NOTE — Patient Instructions (Signed)
Bring any necessary paperwork for me to complete for your employer.  You can change to xanax 1-2 pills up to 3 times per day as needed.  Recheck next week.  Return to the clinic or go to the nearest emergency room if any of your symptoms worsen or new symptoms occur.

## 2012-04-15 ENCOUNTER — Encounter: Payer: Self-pay | Admitting: Family Medicine

## 2012-04-16 NOTE — Progress Notes (Signed)
Appt made with Dr. Neva Seat for 04/21/12. Taylor Knight

## 2012-04-21 ENCOUNTER — Ambulatory Visit (INDEPENDENT_AMBULATORY_CARE_PROVIDER_SITE_OTHER): Payer: 59 | Admitting: Family Medicine

## 2012-04-21 ENCOUNTER — Encounter: Payer: Self-pay | Admitting: Family Medicine

## 2012-04-21 VITALS — BP 120/78 | HR 70 | Temp 97.7°F | Resp 16 | Ht 66.0 in | Wt 149.0 lb

## 2012-04-21 DIAGNOSIS — F411 Generalized anxiety disorder: Secondary | ICD-10-CM

## 2012-04-21 DIAGNOSIS — F419 Anxiety disorder, unspecified: Secondary | ICD-10-CM

## 2012-04-21 DIAGNOSIS — F32A Depression, unspecified: Secondary | ICD-10-CM

## 2012-04-21 DIAGNOSIS — Z23 Encounter for immunization: Secondary | ICD-10-CM

## 2012-04-21 DIAGNOSIS — F329 Major depressive disorder, single episode, unspecified: Secondary | ICD-10-CM

## 2012-04-21 MED ORDER — ALPRAZOLAM 1 MG PO TABS: ORAL_TABLET | ORAL | Status: DC
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
## 2012-04-21 NOTE — Patient Instructions (Signed)
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  New dose of xanax - 1/2 to 1 up to 3 times per day.  Recheck next week. Return to the clinic or go to the nearest emergency room if any of your symptoms worsen or new symptoms occur.

## 2012-04-24 ENCOUNTER — Emergency Department (HOSPITAL_COMMUNITY): Payer: 59

## 2012-04-24 ENCOUNTER — Emergency Department (HOSPITAL_COMMUNITY)
Admission: EM | Admit: 2012-04-24 | Discharge: 2012-04-24 | Disposition: A | Discharge status: Home / Self Care | Payer: 59 | Attending: Emergency Medicine | Admitting: Emergency Medicine

## 2012-04-24 ENCOUNTER — Encounter (HOSPITAL_COMMUNITY): Payer: Self-pay | Admitting: *Deleted

## 2012-04-24 DIAGNOSIS — W19XXXA Unspecified fall, initial encounter: Secondary | ICD-10-CM

## 2012-04-24 DIAGNOSIS — S0003XA Contusion of scalp, initial encounter: Secondary | ICD-10-CM | POA: Insufficient documentation

## 2012-04-24 DIAGNOSIS — Z79899 Other long term (current) drug therapy: Secondary | ICD-10-CM | POA: Insufficient documentation

## 2012-04-24 DIAGNOSIS — S0101XA Laceration without foreign body of scalp, initial encounter: Secondary | ICD-10-CM

## 2012-04-24 DIAGNOSIS — S0100XA Unspecified open wound of scalp, initial encounter: Secondary | ICD-10-CM | POA: Insufficient documentation

## 2012-04-24 DIAGNOSIS — F411 Generalized anxiety disorder: Secondary | ICD-10-CM | POA: Insufficient documentation

## 2012-04-24 DIAGNOSIS — IMO0001 Reserved for inherently not codable concepts without codable children: Secondary | ICD-10-CM | POA: Insufficient documentation

## 2012-04-24 DIAGNOSIS — Y9389 Activity, other specified: Secondary | ICD-10-CM | POA: Insufficient documentation

## 2012-04-24 DIAGNOSIS — Z3202 Encounter for pregnancy test, result negative: Secondary | ICD-10-CM | POA: Insufficient documentation

## 2012-04-24 DIAGNOSIS — R296 Repeated falls: Secondary | ICD-10-CM | POA: Insufficient documentation

## 2012-04-24 DIAGNOSIS — Z23 Encounter for immunization: Secondary | ICD-10-CM | POA: Insufficient documentation

## 2012-04-24 DIAGNOSIS — K219 Gastro-esophageal reflux disease without esophagitis: Secondary | ICD-10-CM | POA: Insufficient documentation

## 2012-04-24 DIAGNOSIS — Y92009 Unspecified place in unspecified non-institutional (private) residence as the place of occurrence of the external cause: Secondary | ICD-10-CM | POA: Insufficient documentation

## 2012-04-24 DIAGNOSIS — F17200 Nicotine dependence, unspecified, uncomplicated: Secondary | ICD-10-CM | POA: Insufficient documentation

## 2012-04-24 DIAGNOSIS — S0083XA Contusion of other part of head, initial encounter: Secondary | ICD-10-CM | POA: Insufficient documentation

## 2012-04-24 DIAGNOSIS — R51 Headache: Secondary | ICD-10-CM | POA: Insufficient documentation

## 2012-04-24 DIAGNOSIS — K226 Gastro-esophageal laceration-hemorrhage syndrome: Secondary | ICD-10-CM | POA: Insufficient documentation

## 2012-04-24 HISTORY — DX: Anxiety disorder, unspecified: F41.9

## 2012-04-24 HISTORY — DX: Depression, unspecified: F32.A

## 2012-04-24 HISTORY — DX: Major depressive disorder, single episode, unspecified: F32.9

## 2012-04-24 LAB — RAPID URINE DRUG SCREEN, HOSP PERFORMED
Amphetamines: NOT DETECTED
Barbiturates: NOT DETECTED
Benzodiazepines: POSITIVE — AB
Cocaine: NOT DETECTED
Opiates: NOT DETECTED
Tetrahydrocannabinol: NOT DETECTED

## 2012-04-24 LAB — URINALYSIS, ROUTINE W REFLEX MICROSCOPIC
Bilirubin Urine: NEGATIVE
Glucose, UA: NEGATIVE mg/dL
Ketones, ur: NEGATIVE mg/dL
Nitrite: NEGATIVE
Protein, ur: NEGATIVE mg/dL
Specific Gravity, Urine: 1.019 (ref 1.005–1.030)
Urobilinogen, UA: 0.2 mg/dL (ref 0.0–1.0)
pH: 6 (ref 5.0–8.0)

## 2012-04-24 LAB — CBC WITH DIFFERENTIAL/PLATELET
Basophils Absolute: 0.1 10*3/uL (ref 0.0–0.1)
Basophils Relative: 1 % (ref 0–1)
Eosinophils Absolute: 0.3 10*3/uL (ref 0.0–0.7)
Eosinophils Relative: 3 % (ref 0–5)
HCT: 39.4 % (ref 36.0–46.0)
Hemoglobin: 14.4 g/dL (ref 12.0–15.0)
Lymphocytes Relative: 26 % (ref 12–46)
Lymphs Abs: 2.8 10*3/uL (ref 0.7–4.0)
MCH: 32.1 pg (ref 26.0–34.0)
MCHC: 36.5 g/dL — ABNORMAL HIGH (ref 30.0–36.0)
MCV: 87.9 fL (ref 78.0–100.0)
Monocytes Absolute: 0.8 10*3/uL (ref 0.1–1.0)
Monocytes Relative: 7 % (ref 3–12)
Neutro Abs: 6.8 10*3/uL (ref 1.7–7.7)
Neutrophils Relative %: 63 % (ref 43–77)
Platelets: 240 10*3/uL (ref 150–400)
RBC: 4.48 MIL/uL (ref 3.87–5.11)
RDW: 12.2 % (ref 11.5–15.5)
WBC: 10.8 10*3/uL — ABNORMAL HIGH (ref 4.0–10.5)

## 2012-04-24 LAB — BASIC METABOLIC PANEL
BUN: 10 mg/dL (ref 6–23)
CO2: 23 mEq/L (ref 19–32)
Calcium: 9 mg/dL (ref 8.4–10.5)
Chloride: 102 mEq/L (ref 96–112)
Creatinine, Ser: 0.67 mg/dL (ref 0.50–1.10)
GFR calc Af Amer: >90 mL/min (ref >90)
GFR calc non Af Amer: >90 mL/min (ref >90)
Glucose, Bld: 89 mg/dL (ref 70–99)
Potassium: 3.7 mEq/L (ref 3.5–5.1)
Sodium: 137 mEq/L (ref 135–145)

## 2012-04-24 LAB — POCT PREGNANCY, URINE: Preg Test, Ur: NEGATIVE

## 2012-04-24 LAB — URINE MICROSCOPIC-ADD ON

## 2012-04-24 LAB — ETHANOL: Alcohol, Ethyl (B): <11 mg/dL (ref 0–11)

## 2012-04-24 MED ORDER — TETANUS-DIPHTH-ACELL PERTUSSIS 5-2.5-18.5 LF-MCG/0.5 IM SUSP
0.5000 mL | Freq: Once | INTRAMUSCULAR | Status: AC
  Administered 2012-04-24: 0.5 mL via INTRAMUSCULAR
  Filled 2012-04-24: qty 0.5

## 2012-04-24 NOTE — ED Notes (Signed)
Wound cleaned and prep for closing

## 2012-04-24 NOTE — ED Notes (Signed)
Patient is alert and oriented x3.  She was given DC instructions and follow up visit instructions.  Patient gave verbal understanding. She was DC ambulatory under his own power to home.  V/S stable.  He was not showing any signs of distress on DC 

## 2012-04-24 NOTE — ED Notes (Signed)
Per pt and family - pt has taken prozac and xanax today as prescribed, fell in bathroom and striking the right side of her head on the towel holder - per pt's family pt was found unconscious. Pt w/ laceration to rt scalp. Pt appears drowsy - easily aroused by verbal stimuli. Pt denies taking extra medication or ETOH.

## 2012-04-24 NOTE — ED Provider Notes (Signed)
History     CSN: [id number]  Arrival date & time 04/24/12  1858   First MD Initiated Contact with Patient 04/24/12 2008      Chief Complaint  Patient presents with  . Head Injury   HPI  History provided by the patient and husband. Patient is a 41 year old female with history of anxiety and depression who presents with a head injury after fall. Patient states that she was at home and took her normal medications of Prozac and Xanax. She took a shower and was lying down to rest and bed. Patient got up to use the bathroom reports feeling weak and lightheaded causing her to fall. There is a questionable LOC. Patient was found by son and husband on the floor. Patient had a laceration to her right scalp with bleeding. Patient continued to be drowsy but was awake. Was no urinary or fecal incontinence. No reports of compulsions. Patient has no history of seizures. She denies taking any extra medications. Denies any other drugs or alcohol use. She denies having any chest pain, shortness of breath or heart palpitations.    Past Medical History  Diagnosis Date  . GERD (gastroesophageal reflux disease)   . Mallory-Weiss syndrome   . Depression   . Anxiety     Past Surgical History  Procedure Date  . Bunionectomy 2009    History reviewed. No pertinent family history.  History  Substance Use Topics  . Smoking status: Current Some Day Smoker -- 0.2 packs/day for 10 years    Types: Cigarettes  . Smokeless tobacco: Never Used     Comment: she stopped smoking 2 weeks ago with this illness and she agrees to continue with no further smoking at this point.  . Alcohol Use: No    OB History    Grav Para Term Preterm Abortions TAB SAB Ect Mult Living                  Review of Systems  Constitutional: Positive for fatigue. Negative for fever.  HENT: Negative for neck pain.   Respiratory: Negative for cough and shortness of breath.   Cardiovascular: Negative for chest pain.    Gastrointestinal: Negative for vomiting and abdominal pain.  Musculoskeletal: Negative for back pain.  Neurological: Positive for headaches. Negative for dizziness, weakness and numbness.  Psychiatric/Behavioral: Negative for suicidal ideas and dysphoric mood.  All other systems reviewed and are negative.    Allergies  Penicillins  Home Medications   Current Outpatient Rx  Name  Route  Sig  Dispense  Refill  . ALBUTEROL SULFATE HFA 108 (90 BASE) MCG/ACT IN AERS   Inhalation   Inhale 2 puffs into the lungs every 6 (six) hours as needed for wheezing.   1 Inhaler   0   . ALPRAZOLAM 1 MG PO TABS      1/2 to 1 tablet by mouth up to three times per day as needed for anxiety.   30 tablet   0   . FLUOXETINE HCL 40 MG PO CAPS   Oral   Take 1 capsule (40 mg total) by mouth daily.   90 capsule   0   . HYDROCODONE-HOMATROPINE 5-1.5 MG/5ML PO SYRP   Oral   Take 5 mLs by mouth every 6 (six) hours as needed.   120 mL   0   . MOMETASONE FUROATE 50 MCG/ACT NA SUSP   Nasal   Place 2 sprays into the nose daily.   17 g   2  BP 103/66  Pulse 68  Temp 97.6 F (36.4 C) (Oral)  Resp 18  Ht 5\' 6"  (1.676 m)  Wt 145 lb (65.772 kg)  BMI 23.40 kg/m2  SpO2 100%  LMP 04/20/2012  Physical Exam  Nursing note and vitals reviewed. Constitutional: She is oriented to person, place, and time. She appears well-developed and well-nourished. No distress.  HENT:  Head: Normocephalic.       Laceration to right posterior scalp. No active bleeding. Small underlying hematoma. No step-offs. No battle signs or raccoon eyes.  Eyes: Conjunctivae normal and EOM are normal. Pupils are equal, round, and reactive to light.  Neck:       Immobilized in c-collar  Cardiovascular: Normal rate and regular rhythm.   No murmur heard. Pulmonary/Chest: Effort normal and breath sounds normal.  Abdominal: Soft. There is no tenderness. There is no rebound and no guarding.  Musculoskeletal: Normal range of  motion. She exhibits no edema and no tenderness.  Neurological: She is alert and oriented to person, place, and time. She has normal strength. No cranial nerve deficit or sensory deficit.  Skin: Skin is warm and dry. No rash noted.  Psychiatric: She has a normal mood and affect. Her behavior is normal.    ED Course  Procedures  LACERATION REPAIR Performed by: Angus Seller Authorized by: Angus Seller Consent: Verbal consent obtained. Risks and benefits: risks, benefits and alternatives were discussed Consent given by: patient Patient identity confirmed: provided demographic data Prepped and Draped in normal sterile fashion Wound explored  Laceration Location: Right posterior scalp  Laceration Length: 4 cm  No Foreign Bodies seen or palpated  Anesthesia: None   Irrigation method: syringe Amount of cleaning: standard  Skin closure: Staple   Number of staples: 3   Patient tolerance: Patient tolerated the procedure well with no immediate complications.     Ct Head Wo Contrast  04/24/2012  *RADIOLOGY REPORT*  Clinical Data:  Injury with head and neck pain  CT HEAD WITHOUT CONTRAST CT CERVICAL SPINE WITHOUT CONTRAST  Technique:  Multidetector CT imaging of the head and cervical spine was performed following the standard protocol without intravenous contrast.  Multiplanar CT image reconstructions of the cervical spine were also generated.  Comparison:   None  CT HEAD  Findings: Injury over the right parietal bone.  No underlying fracture.  The ventricles are normal in size and configuration.  There are no parenchymal masses mass effect, no evidence of a recent infarct, no extra-axial masses or abnormal fluid collections and no intracranial hemorrhage.  The visualized sinuses and mastoid air cells are essentially clear.  IMPRESSION: Right parietal scalp injury. No fracture or intracranial abnormality.  CT CERVICAL SPINE  Findings: There is no fracture or spondylolisthesis.  No  degenerative changes are evident.  The soft tissues are unremarkable.  The lung apices are clear.  IMPRESSION: Normal cervical spine CT.   Original Report Authenticated By: Amie Portland, M.D.    Ct Cervical Spine Wo Contrast  04/24/2012  *RADIOLOGY REPORT*  Clinical Data:  Injury with head and neck pain  CT HEAD WITHOUT CONTRAST CT CERVICAL SPINE WITHOUT CONTRAST  Technique:  Multidetector CT imaging of the head and cervical spine was performed following the standard protocol without intravenous contrast.  Multiplanar CT image reconstructions of the cervical spine were also generated.  Comparison:   None  CT HEAD  Findings: Injury over the right parietal bone.  No underlying fracture.  The ventricles are normal in size and configuration.  There  are no parenchymal masses mass effect, no evidence of a recent infarct, no extra-axial masses or abnormal fluid collections and no intracranial hemorrhage.  The visualized sinuses and mastoid air cells are essentially clear.  IMPRESSION: Right parietal scalp injury. No fracture or intracranial abnormality.  CT CERVICAL SPINE  Findings: There is no fracture or spondylolisthesis.  No degenerative changes are evident.  The soft tissues are unremarkable.  The lung apices are clear.  IMPRESSION: Normal cervical spine CT.   Original Report Authenticated By: Amie Portland, M.D.      1. Fall   2. Scalp laceration   3. Scalp hematoma       MDM  8:10 PM patient seen and evaluated. Patient lying in bed with c-collar in place. She is drowsy but speaks in full sentences and is awake and alert x3.  Patient denies any SI or HI. Patient feels safe at home. She denies any dictation abuse or intentional overdoses. Husband brought prescription medication bottles which have adequate pills in the bottles.   Lab tests and CT scans unremarkable. Scalp laceration irrigated and closed with staples. She'll be discharged at this time.     Angus Seller, [place] 04/24/12 2224

## 2012-04-25 NOTE — Telephone Encounter (Signed)
Dr Neva Seat, patient returned your call.

## 2012-04-25 NOTE — ED Provider Notes (Signed)
Medical screening examination/treatment/procedure(s) were performed by non-physician practitioner and as supervising physician I was immediately available for consultation/collaboration.  Flint Melter, MD 04/25/12 [phone]

## 2012-04-25 NOTE — Telephone Encounter (Signed)
Pt called and wanted Dr. Neva Seat to know that she fell yesterday and was seen at the ER by Dr. Jerral Ralph and Dr.Lance, where they placed stitches on her head.  She was dizzy and fell again today, but is feeling about the same.  Instructed that if she is feeling worse, she should return to ER or be can be seen here if needed.  Best number [phone].

## 2012-04-26 ENCOUNTER — Ambulatory Visit (INDEPENDENT_AMBULATORY_CARE_PROVIDER_SITE_OTHER): Payer: 59 | Admitting: Family Medicine

## 2012-04-26 VITALS — BP 100/75 | HR 83 | Temp 97.5°F | Resp 18

## 2012-04-26 DIAGNOSIS — R51 Headache: Secondary | ICD-10-CM

## 2012-04-26 DIAGNOSIS — S0100XA Unspecified open wound of scalp, initial encounter: Secondary | ICD-10-CM

## 2012-04-26 DIAGNOSIS — IMO0002 Reserved for concepts with insufficient information to code with codable children: Secondary | ICD-10-CM

## 2012-04-26 DIAGNOSIS — S0990XA Unspecified injury of head, initial encounter: Secondary | ICD-10-CM

## 2012-04-26 DIAGNOSIS — S46919A Strain of unspecified muscle, fascia and tendon at shoulder and upper arm level, unspecified arm, initial encounter: Secondary | ICD-10-CM

## 2012-04-26 DIAGNOSIS — S0101XA Laceration without foreign body of scalp, initial encounter: Secondary | ICD-10-CM

## 2012-04-26 NOTE — Patient Instructions (Signed)
Followup with Dr. Chilton Si on Monday as already scheduled.  Be very cautious when you get up to make sure that you are steady and not lightheaded. Hold on  as necessary for walking.  It is okay to shower

## 2012-04-26 NOTE — Telephone Encounter (Signed)
Called patient, to check status left message for call back. She states she has been dizzy and legs feel weak. She states she has headache. She is advised to return to clinic.

## 2012-04-26 NOTE — Telephone Encounter (Signed)
Call patient - check status today.  Make sure she is drinking plenty of water to minimize that cause of dizziness.  The higher dose of xanax can also cause some sedation and dizziness. Can be seen by [place] this weekend if needed if dizziness is persisting.

## 2012-04-26 NOTE — Progress Notes (Signed)
Subjective: 2 days ago the patient was at home and walking toward her Bactrim. Somehow she lost her balance and fell into the thyroid, hitting her head on the tile floor in the vestibule. She apparently struck pretty hard. She was moaning when her husband got there. I believe her son saw her go down. It is unclear whether she had any loss of consciousness or not. She went to [facility] long emergency room where a CT scans were good. She had a laceration on the top of her scalp which received 3 staples. She also had a bruise on her left shoulder posteriorly, with pain in the shoulder. She has felt unsteady and somewhat drowsy since the injury.  Objective: Alert and oriented though obviously having some discomfort still. Wound looks good. She has bruising posteriorly on her left shoulder and tenderness of the upper aspect of the pectoralis. She is tender around the entire shoulder girdle. She can raise her hands over her head. However this does cause pain.  Assessment: Status post closed head injury, uncertain if she had a concussion Laceration scalp, satisfactorily repaired Left shoulder strain and contusion  Plan: No new treatments today. See Dr. Chilton Si on Monday is worried planned.

## 2012-04-28 ENCOUNTER — Ambulatory Visit (INDEPENDENT_AMBULATORY_CARE_PROVIDER_SITE_OTHER): Payer: 59 | Admitting: Family Medicine

## 2012-04-28 ENCOUNTER — Ambulatory Visit: Payer: 59

## 2012-04-28 ENCOUNTER — Encounter (HOSPITAL_COMMUNITY): Payer: Self-pay | Admitting: *Deleted

## 2012-04-28 ENCOUNTER — Emergency Department (HOSPITAL_COMMUNITY)
Admission: EM | Admit: 2012-04-28 | Discharge: 2012-04-28 | Disposition: A | Discharge status: Home / Self Care | Payer: 59 | Attending: Emergency Medicine | Admitting: Emergency Medicine

## 2012-04-28 ENCOUNTER — Encounter: Payer: Self-pay | Admitting: Family Medicine

## 2012-04-28 VITALS — BP 124/80 | HR 72 | Temp 98.0°F | Resp 16 | Ht 67.0 in | Wt 150.0 lb

## 2012-04-28 DIAGNOSIS — M25512 Pain in left shoulder: Secondary | ICD-10-CM

## 2012-04-28 DIAGNOSIS — K226 Gastro-esophageal laceration-hemorrhage syndrome: Secondary | ICD-10-CM | POA: Insufficient documentation

## 2012-04-28 DIAGNOSIS — S40012A Contusion of left shoulder, initial encounter: Secondary | ICD-10-CM

## 2012-04-28 DIAGNOSIS — R42 Dizziness and giddiness: Secondary | ICD-10-CM | POA: Insufficient documentation

## 2012-04-28 DIAGNOSIS — R55 Syncope and collapse: Secondary | ICD-10-CM

## 2012-04-28 DIAGNOSIS — F3289 Other specified depressive episodes: Secondary | ICD-10-CM | POA: Insufficient documentation

## 2012-04-28 DIAGNOSIS — F411 Generalized anxiety disorder: Secondary | ICD-10-CM | POA: Insufficient documentation

## 2012-04-28 DIAGNOSIS — R110 Nausea: Secondary | ICD-10-CM

## 2012-04-28 DIAGNOSIS — W19XXXA Unspecified fall, initial encounter: Secondary | ICD-10-CM | POA: Insufficient documentation

## 2012-04-28 DIAGNOSIS — Y939 Activity, unspecified: Secondary | ICD-10-CM | POA: Insufficient documentation

## 2012-04-28 DIAGNOSIS — M25519 Pain in unspecified shoulder: Secondary | ICD-10-CM

## 2012-04-28 DIAGNOSIS — Y929 Unspecified place or not applicable: Secondary | ICD-10-CM | POA: Insufficient documentation

## 2012-04-28 DIAGNOSIS — Z79899 Other long term (current) drug therapy: Secondary | ICD-10-CM | POA: Insufficient documentation

## 2012-04-28 DIAGNOSIS — F17200 Nicotine dependence, unspecified, uncomplicated: Secondary | ICD-10-CM | POA: Insufficient documentation

## 2012-04-28 DIAGNOSIS — R51 Headache: Secondary | ICD-10-CM

## 2012-04-28 DIAGNOSIS — S060X9A Concussion with loss of consciousness of unspecified duration, initial encounter: Secondary | ICD-10-CM | POA: Insufficient documentation

## 2012-04-28 DIAGNOSIS — Z8719 Personal history of other diseases of the digestive system: Secondary | ICD-10-CM | POA: Insufficient documentation

## 2012-04-28 DIAGNOSIS — F329 Major depressive disorder, single episode, unspecified: Secondary | ICD-10-CM | POA: Insufficient documentation

## 2012-04-28 DIAGNOSIS — S0990XA Unspecified injury of head, initial encounter: Secondary | ICD-10-CM

## 2012-04-28 LAB — POCT CBC
Granulocyte percent: 70.8 %G (ref 37–80)
HCT, POC: 46.5 % (ref 37.7–47.9)
Hemoglobin: 14.4 g/dL (ref 12.2–16.2)
Lymph, poc: 3.1 (ref 0.6–3.4)
MCH, POC: 30 pg (ref 27–31.2)
MCHC: 31 g/dL — AB (ref 31.8–35.4)
MCV: 96.8 fL (ref 80–97)
MID (cbc): 0.5 (ref 0–0.9)
MPV: 8.1 fL (ref 0–99.8)
POC Granulocyte: 8.8 — AB (ref 2–6.9)
POC LYMPH PERCENT: 25.2 %L (ref 10–50)
POC MID %: 4 %M (ref 0–12)
Platelet Count, POC: 336 10*3/uL (ref 142–424)
RBC: 4.8 M/uL (ref 4.04–5.48)
RDW, POC: 13 %
WBC: 12.5 10*3/uL — AB (ref 4.6–10.2)

## 2012-04-28 LAB — GLUCOSE, POCT (MANUAL RESULT ENTRY): POC Glucose: 84 mg/dl (ref 70–99)

## 2012-04-28 MED ORDER — METOCLOPRAMIDE HCL 10 MG PO TABS: 10.0000 mg | ORAL_TABLET | Freq: Four times a day (QID) | ORAL | Status: DC | PRN

## 2012-04-28 MED ORDER — METOCLOPRAMIDE HCL 5 MG/ML IJ SOLN
10.0000 mg | Freq: Once | INTRAMUSCULAR | Status: AC
  Administered 2012-04-28: 10 mg via INTRAMUSCULAR
  Filled 2012-04-28: qty 2

## 2012-04-28 NOTE — ED Provider Notes (Signed)
History     CSN: [id number]  Arrival date & time 04/28/12  1919   First MD Initiated Contact with Patient 04/28/12 2014      Chief Complaint  Patient presents with  . Headache  . Head Injury    (Consider location/radiation/quality/duration/timing/severity/associated sxs/prior treatment) The history is provided by the patient.  Taylor Knight is a 41 y.o. female hx of anxiety, depression here with headaches. She took xanax on 11/21 and had a fall. She had nl CT head/cervical spine at the time. Afterwards, she had some dizziness when she stands up. Today, she developed some mild headaches with the dizziness. She was seen at PMD office and sent for eval. Denies vomiting. But she continues to feel anxious. No vision changes. Not on coumadin, aspirin, or plavix.    Past Medical History  Diagnosis Date  . GERD (gastroesophageal reflux disease)   . Mallory-Weiss syndrome   . Depression   . Anxiety     Past Surgical History  Procedure Date  . Bunionectomy 2009    History reviewed. No pertinent family history.  History  Substance Use Topics  . Smoking status: Current Some Day Smoker -- 0.2 packs/day for 10 years    Types: Cigarettes  . Smokeless tobacco: Never Used     Comment: she stopped smoking 2 weeks ago with this illness and she agrees to continue with no further smoking at this point.  . Alcohol Use: No    OB History    Grav Para Term Preterm Abortions TAB SAB Ect Mult Living                  Review of Systems  Neurological: Positive for dizziness and headaches.  All other systems reviewed and are negative.    Allergies  Penicillins  Home Medications   Current Outpatient Rx  Name  Route  Sig  Dispense  Refill  . ALPRAZOLAM 1 MG PO TABS      0.5-1 mg 3 (three) times daily as needed. anxiety         . ASPIRIN-ACETAMINOPHEN-CAFFEINE 250-250-65 MG PO TABS   Oral   Take 1 tablet by mouth every 6 (six) hours as needed. headache         . FLUOXETINE HCL  40 MG PO CAPS   Oral   Take 1 capsule (40 mg total) by mouth daily.   90 capsule   0   . MOMETASONE FUROATE 50 MCG/ACT NA SUSP   Nasal   Place 2 sprays into the nose daily as needed. allergies           BP 112/75  Pulse 72  Temp 98 F (36.7 C) (Oral)  Resp 18  SpO2 98%  LMP 04/20/2012  Physical Exam  Nursing note and vitals reviewed. Constitutional: She is oriented to person, place, and time. She appears well-developed and well-nourished.       Anxious   HENT:  Head: Normocephalic.  Mouth/Throat: Oropharynx is clear and moist.       No scalp hematoma   Eyes: Conjunctivae normal are normal. Pupils are equal, round, and reactive to light.  Neck: Normal range of motion. Neck supple.  Cardiovascular: Normal rate, regular rhythm and normal heart sounds.   Pulmonary/Chest: Effort normal and breath sounds normal. No respiratory distress. She has no wheezes.  Abdominal: Soft. Bowel sounds are normal. She exhibits no distension. There is no tenderness. There is no rebound.  Musculoskeletal: Normal range of motion. She exhibits no edema and  no tenderness.  Neurological: She is alert and oriented to person, place, and time. No cranial nerve deficit. Coordination normal.       Nl gait, no pronator drift. Nl tandem gait   Skin: Skin is warm and dry.       Staples on scalp healing well.   Psychiatric: She has a normal mood and affect. Her behavior is normal. Judgment and thought content normal.    ED Course  Procedures (including critical care time)  Labs Reviewed - No data to display Dg Shoulder Left  04/28/2012  *RADIOLOGY REPORT*  Clinical Data: Left shoulder pain and contusion from a recent fall.  LEFT SHOULDER - 2+ VIEW  Comparison: None.  Findings: Two frontal views of the left shoulder demonstrate normal appearing bones and soft tissues.  No fractures or visible dislocation.  IMPRESSION: Limited examination due to the lack of a lateral scapular view or axillary view to  exclude posterior dislocation.  Otherwise, normal examination.   Original Report Authenticated By: Beckie Salts, M.D.      No diagnosis found.    MDM  Taylor Knight is a 41 y.o. female here with concussion. She has nl neuro exam. She doesn't require repeat CT head at this point. Recommend outpatient f/u. Will give reglan prn headache.           Richardean Canal, MD 04/28/12 2038

## 2012-04-28 NOTE — Patient Instructions (Signed)
Go to [facility] ER after leaving the office.  I will advise the triage nurse you are on the way.  Follow up with me in 1 week, or sooner if needed as determined by your evaluation in the emergency room.   You can wear the sling as needed for your left shoulder - make sure you take it off at least twice per day to move elbow and wrist.  Return to the clinic or go to the nearest emergency room if any of your symptoms worsen or new symptoms occur.
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
## 2012-04-28 NOTE — ED Notes (Addendum)
Pt in c/o continued headache since head injury on 11/21. Pt was seen at that time and again for same on 11/23 but is c/o continued headaches and dizziness, dizziness is worse upon standing. Pt is alert and oriented.

## 2012-04-28 NOTE — ED Notes (Signed)
Pt fell in bathroom on 11/21 then several days later has developed head pain, dizziness, left shoulder pain.  Denies vision problems or vomiting.  Has had some mild nausea today.

## 2012-04-29 NOTE — Progress Notes (Signed)
Note to Dr. Ouray Sink appts available with him until 06/02/12.

## 2012-04-30 ENCOUNTER — Emergency Department (HOSPITAL_COMMUNITY): Payer: 59

## 2012-04-30 ENCOUNTER — Encounter (HOSPITAL_COMMUNITY): Payer: Self-pay | Admitting: *Deleted

## 2012-04-30 ENCOUNTER — Inpatient Hospital Stay (HOSPITAL_COMMUNITY)
Admission: EM | Admit: 2012-04-30 | Discharge: 2012-05-01 | DRG: 101 | Disposition: A | Discharge status: Home / Self Care | Payer: 59 | Attending: Internal Medicine | Admitting: Internal Medicine

## 2012-04-30 DIAGNOSIS — Z7982 Long term (current) use of aspirin: Secondary | ICD-10-CM

## 2012-04-30 DIAGNOSIS — R5381 Other malaise: Secondary | ICD-10-CM | POA: Diagnosis present

## 2012-04-30 DIAGNOSIS — R112 Nausea with vomiting, unspecified: Secondary | ICD-10-CM | POA: Diagnosis present

## 2012-04-30 DIAGNOSIS — F341 Dysthymic disorder: Secondary | ICD-10-CM | POA: Diagnosis present

## 2012-04-30 DIAGNOSIS — I83893 Varicose veins of bilateral lower extremities with other complications: Secondary | ICD-10-CM

## 2012-04-30 DIAGNOSIS — F418 Other specified anxiety disorders: Secondary | ICD-10-CM | POA: Diagnosis present

## 2012-04-30 DIAGNOSIS — Z880 Allergy status to penicillin: Secondary | ICD-10-CM

## 2012-04-30 DIAGNOSIS — Z9181 History of falling: Secondary | ICD-10-CM

## 2012-04-30 DIAGNOSIS — F17200 Nicotine dependence, unspecified, uncomplicated: Secondary | ICD-10-CM | POA: Diagnosis present

## 2012-04-30 DIAGNOSIS — K219 Gastro-esophageal reflux disease without esophagitis: Secondary | ICD-10-CM | POA: Diagnosis present

## 2012-04-30 DIAGNOSIS — R569 Unspecified convulsions: Principal | ICD-10-CM | POA: Diagnosis present

## 2012-04-30 LAB — CBC WITH DIFFERENTIAL/PLATELET
Basophils Absolute: 0 10*3/uL (ref 0.0–0.1)
Basophils Relative: 0 % (ref 0–1)
Eosinophils Absolute: 0.2 10*3/uL (ref 0.0–0.7)
Eosinophils Relative: 1 % (ref 0–5)
HCT: 37.6 % (ref 36.0–46.0)
Hemoglobin: 13.3 g/dL (ref 12.0–15.0)
Lymphocytes Relative: 15 % (ref 12–46)
Lymphs Abs: 2.8 10*3/uL (ref 0.7–4.0)
MCH: 31.8 pg (ref 26.0–34.0)
MCHC: 35.4 g/dL (ref 30.0–36.0)
MCV: 90 fL (ref 78.0–100.0)
Monocytes Absolute: 0.9 10*3/uL (ref 0.1–1.0)
Monocytes Relative: 5 % (ref 3–12)
Neutro Abs: 14.3 10*3/uL — ABNORMAL HIGH (ref 1.7–7.7)
Neutrophils Relative %: 79 % — ABNORMAL HIGH (ref 43–77)
Platelets: 315 10*3/uL (ref 150–400)
RBC: 4.18 MIL/uL (ref 3.87–5.11)
RDW: 12.4 % (ref 11.5–15.5)
WBC: 18.2 10*3/uL — ABNORMAL HIGH (ref 4.0–10.5)

## 2012-04-30 LAB — COMPREHENSIVE METABOLIC PANEL
ALT: 25 U/L (ref 0–35)
AST: 20 U/L (ref 0–37)
Albumin: 3.5 g/dL (ref 3.5–5.2)
Alkaline Phosphatase: 37 U/L — ABNORMAL LOW (ref 39–117)
BUN: 11 mg/dL (ref 6–23)
CO2: 19 mEq/L (ref 19–32)
Calcium: 8.7 mg/dL (ref 8.4–10.5)
Chloride: 106 mEq/L (ref 96–112)
Creatinine, Ser: 0.72 mg/dL (ref 0.50–1.10)
GFR calc Af Amer: >90 mL/min (ref >90)
GFR calc non Af Amer: >90 mL/min (ref >90)
Glucose, Bld: 130 mg/dL — ABNORMAL HIGH (ref 70–99)
Potassium: 3.7 mEq/L (ref 3.5–5.1)
Sodium: 143 mEq/L (ref 135–145)
Total Bilirubin: 0.1 mg/dL — ABNORMAL LOW (ref 0.3–1.2)
Total Protein: 6.8 g/dL (ref 6.0–8.3)

## 2012-04-30 LAB — RAPID URINE DRUG SCREEN, HOSP PERFORMED
Amphetamines: NOT DETECTED
Barbiturates: NOT DETECTED
Benzodiazepines: NOT DETECTED
Cocaine: NOT DETECTED
Opiates: NOT DETECTED
Tetrahydrocannabinol: NOT DETECTED

## 2012-04-30 LAB — PREGNANCY, URINE: Preg Test, Ur: NEGATIVE

## 2012-04-30 LAB — ETHANOL: Alcohol, Ethyl (B): <11 mg/dL (ref 0–11)

## 2012-04-30 MED ORDER — ONDANSETRON HCL 4 MG/2ML IJ SOLN
4.0000 mg | Freq: Once | INTRAMUSCULAR | Status: AC
  Administered 2012-04-30: 4 mg via INTRAVENOUS
  Filled 2012-04-30: qty 2

## 2012-04-30 MED ORDER — LORAZEPAM 2 MG/ML IJ SOLN
1.0000 mg | Freq: Once | INTRAMUSCULAR | Status: AC
  Administered 2012-04-30: 1 mg via INTRAVENOUS
  Filled 2012-04-30: qty 1

## 2012-04-30 MED ORDER — SODIUM CHLORIDE 0.9 % IV SOLN
Freq: Once | INTRAVENOUS | Status: AC
  Administered 2012-04-30: 21:00:00 via INTRAVENOUS

## 2012-04-30 NOTE — ED Notes (Signed)
Pt to ED via EMS after N/V and abd pain that began this pm; pt was seen on 11/21 after a fall and closed head injury and laceration to head w/ 3 sutures; pt reports vomiting x 4-5 times; denies diarrhea; after EMS put pt on stretcher and left room pt began to have a seizure; shaking began to lower extremities then pt began to have a grand mal seizure; seizure lasted approx 45-60 secs; pt with no reported hx of seizures.

## 2012-04-30 NOTE — H&P (Signed)
[facility] History and Physical  Taylor Knight QMV:[id number] DOB: 1970/08/01    PCP:   Mickie Hillier, MD   Chief Complaint: nausea and vomiting.  She was witnessed to have 2 seizures today.  HPI: Taylor Knight is an 41 y.o. female with hx of anxiety and depression, had been on low dose Klonopin in the past tid, recently found to have more anxiety, so she was switched to Xanax at 1mg  tid, but she stopped taking her meds about 4-5 days ago.  She also has nausea and vomiting without any abdominal pain, and has been taking compazine as well.  Her family reported that she had a seizure at home.  When she was in the ER, staff witnessed GTC seizure lasting about 30 seconds with post ictal confusion.  Work up in the ER included a leukocytosis with WBC of 18K, normal renal fx tests, normal LFTs, and normal blood glucose.  She was given IV Ativan.  When I saw her, she didn't recall any of these events, but is alert and able to carry on meaningful conversation.   She denied any drug use, specifically no cocaine use.  She has a negative head CT.  Hospitalist was asked to admit her for new onset of seizure.   Rewiew of Systems:  Constitutional: Negative for malaise, fever and chills. No significant weight loss or weight gain Eyes: Negative for eye pain, redness and discharge, diplopia, visual changes, or flashes of light. ENMT: Negative for ear pain, hoarseness, nasal congestion, sinus pressure and sore throat. No headaches; tinnitus, drooling, or problem swallowing. Cardiovascular: Negative for chest pain, palpitations, diaphoresis, dyspnea and peripheral edema. ; No orthopnea, PND Respiratory: Negative for cough, hemoptysis, wheezing and stridor. No pleuritic chestpain. Gastrointestinal: Negative for diarrhea, constipation, abdominal pain, melena, blood in stool, hematemesis, jaundice and rectal bleeding.    Genitourinary: Negative for frequency, dysuria, incontinence,flank pain and  hematuria; Musculoskeletal: Negative for back pain and neck pain. Negative for swelling and trauma.;  Skin: . Negative for pruritus, rash, abrasions, bruising and skin lesion.; ulcerations Neuro: Negative for headache, lightheadedness and neck stiffness. Negative for weakness, altered level of consciousness , extremity weakness, burning feet, involuntary movement, seizure and syncope.  Psych: negative for tearfulness, panic attacks, hallucinations, paranoia, suicidal or homicidal ideation   Past Medical History  Diagnosis Date  . GERD (gastroesophageal reflux disease)   . Mallory-Weiss syndrome   . Depression   . Anxiety   . Concussion     Past Surgical History  Procedure Date  . Bunionectomy 2009    Medications:  HOME MEDS: Prior to Admission medications   Medication Sig Start Date End Date Taking? Authorizing Provider  ALPRAZolam (XANAX) 1 MG tablet 0.5-1 mg 3 (three) times daily as needed. anxiety 04/21/12  Yes Shade Flood, MD  aspirin 325 MG tablet Take 325 mg by mouth every 6 (six) hours as needed. Headache   Yes Historical Provider, MD  aspirin-acetaminophen-caffeine (EXCEDRIN MIGRAINE) [phone] MG per tablet Take 1 tablet by mouth every 6 (six) hours as needed. headache   Yes Historical Provider, MD  FLUoxetine (PROZAC) 40 MG capsule Take 1 capsule (40 mg total) by mouth daily. 03/24/12  Yes Shade Flood, MD  mometasone (NASONEX) 50 MCG/ACT nasal spray Place 2 sprays into the nose daily as needed. allergies 11/10/11 11/09/12 Yes Shade Flood, MD  metoCLOPramide (REGLAN) 10 MG tablet Take 1 tablet (10 mg total) by mouth every 6 (six) hours as needed (nausea/headache). 04/28/12   Richardean Canal,  MD     Allergies:  Allergies  Allergen Reactions  . Penicillins Hives    ITCHING    Social History:   reports that she has been smoking Cigarettes.  She has a 2.5 pack-year smoking history. She has never used smokeless tobacco. She reports that she does not drink  alcohol or use illicit drugs.  Family History: No family history on file.   Physical Exam: Filed Vitals:   04/30/12 2011 04/30/12 2100 04/30/12 2200 04/30/12 2318  BP: 133/60 104/60 111/65 108/57  Pulse: 112 92 100 89  Temp: 97.4 F (36.3 C)     TempSrc: Oral     Resp: 28 26 25 24   SpO2: 96% 96% 96% 98%   Blood pressure 108/57, pulse 89, temperature 97.4 F (36.3 C), temperature source Oral, resp. rate 24, last menstrual period 04/20/2012, SpO2 98.00%.  GEN:  Pleasant  patient lying in the stretcher in no acute distress; cooperative with exam. PSYCH:  alert and oriented x4; does not appear anxious or depressed; affect is appropriate. HEENT: Mucous membranes pink and anicteric; PERRLA; EOM intact; no cervical lymphadenopathy nor thyromegaly or carotid bruit; no JVD; There were no stridor. Neck is very supple. Breasts:: Not examined CHEST WALL: No tenderness CHEST: Normal respiration, clear to auscultation bilaterally.  HEART: Regular rate and rhythm.  There are no murmur, rub, or gallops.   BACK: No kyphosis or scoliosis; no CVA tenderness ABDOMEN: soft and non-tender; no masses, no organomegaly, normal abdominal bowel sounds; no pannus; no intertriginous candida. There is no rebound and no distention. Rectal Exam: Not done EXTREMITIES: No bone or joint deformity; age-appropriate arthropathy of the hands and knees; no edema; no ulcerations.  There is no calf tenderness. Genitalia: not examined PULSES: 2+ and symmetric SKIN: Normal hydration no rash or ulceration CNS: Cranial nerves 2-12 grossly intact no focal lateralizing neurologic deficit.  Speech is fluent; uvula elevated with phonation, facial symmetry and tongue midline. DTR are normal bilaterally, cerebella exam is intact, barbinski is negative and strengths are equaled bilaterally.  No sensory loss.   Labs on Admission:  Basic Metabolic Panel:  Lab 04/30/12 1478 04/24/12 2048  NA 143 137  K 3.7 3.7  CL 106 102  CO2  19 23  GLUCOSE 130* 89  BUN 11 10  CREATININE 0.72 0.67  CALCIUM 8.7 9.0  MG -- --  PHOS -- --   Liver Function Tests:  Lab 04/30/12 2028  AST 20  ALT 25  ALKPHOS 37*  BILITOT 0.1*  PROT 6.8  ALBUMIN 3.5   No results found for this basename: LIPASE:5,AMYLASE:5 in the last 168 hours No results found for this basename: AMMONIA:5 in the last 168 hours CBC:  Lab 04/30/12 2028 04/28/12 1829 04/24/12 2048  WBC 18.2* 12.5* 10.8*  NEUTROABS 14.3* -- 6.8  HGB 13.3 14.4 14.4  HCT 37.6 46.5 39.4  MCV 90.0 96.8 87.9  PLT 315 -- 240   Cardiac Enzymes: No results found for this basename: CKTOTAL:5,CKMB:5,CKMBINDEX:5,TROPONINI:5 in the last 168 hours  CBG: No results found for this basename: GLUCAP:5 in the last 168 hours   Radiological Exams on Admission: Ct Head Wo Contrast  04/30/2012  *RADIOLOGY REPORT*  Clinical Data: Seizure.  Recent history of head injury.  CT HEAD WITHOUT CONTRAST  Technique:  Contiguous axial images were obtained from the base of the skull through the vertex without contrast.  Comparison: Head CT 04/24/2012.  Findings: Skin staples are noted in the right parietal scalp. No acute displaced skull  fractures are identified.  No acute intracranial abnormality.  Specifically, no evidence of acute post- traumatic intracranial hemorrhage, no definite regions of acute/subacute cerebral ischemia, no focal mass, mass effect, hydrocephalus or abnormal intra or extra-axial fluid collections. The visualized paranasal sinuses and mastoids are well pneumatized.  IMPRESSION: 1.  No acute displaced skull fractures or acute intracranial abnormalities. 2.  The appearance of the brain is normal.   Original Report Authenticated By: Trudie Reed, M.D.      Assessment/Plan Present on Admission:  . Seizure . Depression with anxiety  PLAN:  I do suspect she has withdrawal seizures, aggravated with using compazine, which lowers the seizure's threshold.  Will admit her to SDU for  closer monitoring.  I will restart her back on her benzodiazepine.  Will need to obtain an MRI with and without contrast along with an EEG.  Please note her UDS is totally negative.  Please consult neurology tomorrow for further recommendation, especially on her ability to drive.  She is stable, full code, and will be admitted to [facility] service.  Other plans as per orders.  Code Status: FULL Unk Lightning, MD. [facility] Pager [phone] 7pm to 7am.  04/30/2012, 11:51 PM

## 2012-04-30 NOTE — ED Notes (Signed)
ZOX:[id number]<AV> Expected date:04/30/12<BR> Expected time: 7:53 PM<BR> Means of arrival:Ambulance<BR> Comments:<BR> N/V

## 2012-04-30 NOTE — ED Notes (Signed)
Pt brought in by EMS with c/o nausea and vomiting that started about an hour ago  Pt denies any other complaints  EMS started an IV of NS and gave Zofran 4mg  prior to arrival   Pt states she feels better upon arrival  No acute distress noted

## 2012-04-30 NOTE — ED Provider Notes (Addendum)
History    Level V caveat CSN: [id number]  Arrival date & time 04/30/12  9147   First MD Initiated Contact with Patient 04/30/12 2011      Chief Complaint  Patient presents with  . Emesis  . Abdominal Pain  . Seizures    (Consider location/radiation/quality/duration/timing/severity/associated sxs/prior treatment) Patient is a 41 y.o. female presenting with vomiting, abdominal pain, and seizures.  Emesis  Associated symptoms include abdominal pain.  Abdominal Pain The primary symptoms of the illness include abdominal pain and vomiting.  Seizures  Associated symptoms include vomiting.   patient presented via EMS with reports that she had nausea and vomiting. Prior to my evaluation the patient was noted to have a seizure by staff that was grand mal in nature and lasted approximately 60 seconds. She was post ictal afterwards. She is unable to tell me why she is here exactly or what happened prior to this. It is reported and noted in the chart review that she was seen here several days ago for a fall and struck her head and required suture placement at that time. The patient's husband and son subsequently arrived and gave additional history. The son did hear her fall several days ago and did not witness any seizure activity. The patient had a seizure at home and that's why EMS was called today. The patient is now more awake and states that she has been taking medicine for anxiety and depression. She states that she has been taking Xanax for several days then states that she has been taking Xanax for several weeks. The patient's son and husband think that she has likely been taking these medications longer and are concerned she has been taking the medications more than she should be. She has had increased sleeping and has not had episodes with a heparin item unable to locate her. The patient currently has continued to have some nausea and vomiting but does not complaining of any abdominal  pain.  Past Medical History  Diagnosis Date  . GERD (gastroesophageal reflux disease)   . Mallory-Weiss syndrome   . Depression   . Anxiety   . Concussion     Past Surgical History  Procedure Date  . Bunionectomy 2009    No family history on file.  History  Substance Use Topics  . Smoking status: Current Some Day Smoker -- 0.2 packs/day for 10 years    Types: Cigarettes  . Smokeless tobacco: Never Used     Comment: she stopped smoking 2 weeks ago with this illness and she agrees to continue with no further smoking at this point.  . Alcohol Use: No    OB History    Grav Para Term Preterm Abortions TAB SAB Ect Mult Living                  Review of Systems  Unable to perform ROS Gastrointestinal: Positive for vomiting and abdominal pain.  Neurological: Positive for seizures.    Allergies  Penicillins  Home Medications   Current Outpatient Rx  Name  Route  Sig  Dispense  Refill  . ALPRAZOLAM 1 MG PO TABS      0.5-1 mg 3 (three) times daily as needed. anxiety         . ASPIRIN 325 MG PO TABS   Oral   Take 325 mg by mouth every 6 (six) hours as needed. Headache         . ASPIRIN-ACETAMINOPHEN-CAFFEINE 250-250-65 MG PO TABS   Oral  Take 1 tablet by mouth every 6 (six) hours as needed. headache         . FLUOXETINE HCL 40 MG PO CAPS   Oral   Take 1 capsule (40 mg total) by mouth daily.   90 capsule   0   . MOMETASONE FUROATE 50 MCG/ACT NA SUSP   Nasal   Place 2 sprays into the nose daily as needed. allergies         . METOCLOPRAMIDE HCL 10 MG PO TABS   Oral   Take 1 tablet (10 mg total) by mouth every 6 (six) hours as needed (nausea/headache).   15 tablet   0     BP 133/60  Pulse 112  Temp 97.4 F (36.3 C) (Oral)  Resp 28  SpO2 96%  LMP 04/20/2012  Physical Exam  Nursing note and vitals reviewed. Constitutional: She appears well-developed and well-nourished.  HENT:  Head: Normocephalic and atraumatic.       Healing laceration  to scalp  Eyes: Conjunctivae normal and EOM are normal. Pupils are equal, round, and reactive to light.  Neck: Normal range of motion. Neck supple.  Cardiovascular: Normal rate, regular rhythm, normal heart sounds and intact distal pulses.   Pulmonary/Chest: Effort normal and breath sounds normal.  Abdominal: Soft. Bowel sounds are normal.  Musculoskeletal: Normal range of motion.  Neurological: She is alert.  Skin: Skin is warm and dry.  Psychiatric: She has a normal mood and affect. Thought content normal.    ED Course  Procedures (including critical care time)  Labs Reviewed  CBC WITH DIFFERENTIAL - Abnormal; Notable for the following:    WBC 18.2 (*)     Neutrophils Relative 79 (*)     Neutro Abs 14.3 (*)     All other components within normal limits  COMPREHENSIVE METABOLIC PANEL - Abnormal; Notable for the following:    Glucose, Bld 130 (*)     Alkaline Phosphatase 37 (*)     Total Bilirubin 0.1 (*)     All other components within normal limits  ETHANOL  URINE RAPID DRUG SCREEN (HOSP PERFORMED)   Ct Head Wo Contrast  04/30/2012  *RADIOLOGY REPORT*  Clinical Data: Seizure.  Recent history of head injury.  CT HEAD WITHOUT CONTRAST  Technique:  Contiguous axial images were obtained from the base of the skull through the vertex without contrast.  Comparison: Head CT 04/24/2012.  Findings: Skin staples are noted in the right parietal scalp. No acute displaced skull fractures are identified.  No acute intracranial abnormality.  Specifically, no evidence of acute post- traumatic intracranial hemorrhage, no definite regions of acute/subacute cerebral ischemia, no focal mass, mass effect, hydrocephalus or abnormal intra or extra-axial fluid collections. The visualized paranasal sinuses and mastoids are well pneumatized.  IMPRESSION: 1.  No acute displaced skull fractures or acute intracranial abnormalities. 2.  The appearance of the brain is normal.   Original Report Authenticated By:  Trudie Reed, M.D.      No diagnosis found.     MDM  Review of the [place] drug database reveals multiple prescriptions for Xanax and Clonopin with the most recent Xanax 1 mg tablets #30 dispensed on 1118 and prior to that alprazolam 0.25 mg tablets numbering 30 dispensed on 04/14/2012. The patient was able to tell me later that she had not taken Xanax since the day of the "fall". She denies any memory of this episode. I am concerned that this was a first seizure. I'm concerned she was  having withdrawal from Xanax. She has had 2 new onset seizures today. She is given a milligram of Ativan here. She'll be admitted.  Patient discussed with Dr. Conley Rolls and he will see and admit.    CRITICAL CARE Performed by: Hilario Quarry   Total critical care time: 45  Critical care time was exclusive of separately billable procedures and treating other patients.  Critical care was necessary to treat or prevent imminent or life-threatening deterioration.  Critical care was time spent personally by me on the following activities: development of treatment plan with patient and/or surrogate as well as nursing, discussions with consultants, evaluation of patient's response to treatment, examination of patient, obtaining history from patient or surrogate, ordering and performing treatments and interventions, ordering and review of laboratory studies, ordering and review of radiographic studies, pulse oximetry and re-evaluation of patient's condition.    Hilario Quarry, MD 04/30/12 4540  Hilario Quarry, MD 04/30/12 2306

## 2012-04-30 NOTE — ED Notes (Signed)
Pt attempted to void on bedpan however was unable at this time.

## 2012-05-01 ENCOUNTER — Inpatient Hospital Stay (HOSPITAL_COMMUNITY): Payer: 59

## 2012-05-01 ENCOUNTER — Inpatient Hospital Stay (HOSPITAL_COMMUNITY)
Admit: 2012-05-01 | Discharge: 2012-05-01 | Disposition: A | Discharge status: Home / Self Care | Payer: 59 | Attending: Internal Medicine | Admitting: Internal Medicine

## 2012-05-01 ENCOUNTER — Encounter (HOSPITAL_COMMUNITY): Payer: Self-pay | Admitting: *Deleted

## 2012-05-01 DIAGNOSIS — R112 Nausea with vomiting, unspecified: Secondary | ICD-10-CM

## 2012-05-01 DIAGNOSIS — R569 Unspecified convulsions: Secondary | ICD-10-CM

## 2012-05-01 DIAGNOSIS — F341 Dysthymic disorder: Secondary | ICD-10-CM

## 2012-05-01 LAB — MRSA PCR SCREENING: MRSA by PCR: NEGATIVE

## 2012-05-01 MED ORDER — ENOXAPARIN SODIUM 40 MG/0.4ML ~~LOC~~ SOLN
40.0000 mg | Freq: Every day | SUBCUTANEOUS | Status: DC
  Administered 2012-05-01: 40 mg via SUBCUTANEOUS
  Filled 2012-05-01: qty 0.4

## 2012-05-01 MED ORDER — PNEUMOCOCCAL VAC POLYVALENT 25 MCG/0.5ML IJ INJ
0.5000 mL | INJECTION | Freq: Once | INTRAMUSCULAR | Status: AC
  Administered 2012-05-01: 0.5 mL via INTRAMUSCULAR
  Filled 2012-05-01: qty 0.5

## 2012-05-01 MED ORDER — ALPRAZOLAM 1 MG PO TABS: 0.5000 mg | ORAL_TABLET | Freq: Three times a day (TID) | ORAL | Status: DC | PRN

## 2012-05-01 MED ORDER — ASPIRIN 325 MG PO TABS
325.0000 mg | ORAL_TABLET | Freq: Four times a day (QID) | ORAL | Status: DC | PRN
  Filled 2012-05-01: qty 1

## 2012-05-01 MED ORDER — BUTALBITAL-APAP-CAFFEINE 50-325-40 MG PO TABS
1.0000 | ORAL_TABLET | ORAL | Status: DC | PRN
  Administered 2012-05-01: 1 via ORAL
  Filled 2012-05-01: qty 1

## 2012-05-01 MED ORDER — SODIUM CHLORIDE 0.9 % IV SOLN
INTRAVENOUS | Status: DC
  Administered 2012-05-01: 02:00:00 via INTRAVENOUS

## 2012-05-01 MED ORDER — FLUTICASONE PROPIONATE 50 MCG/ACT NA SUSP
1.0000 | Freq: Every day | NASAL | Status: DC
  Administered 2012-05-01: 1 via NASAL
  Filled 2012-05-01: qty 16

## 2012-05-01 MED ORDER — MORPHINE SULFATE 2 MG/ML IJ SOLN: 2.0000 mg | INTRAMUSCULAR | Status: DC | PRN

## 2012-05-01 MED ORDER — LORAZEPAM 1 MG PO TABS: 1.0000 mg | ORAL_TABLET | Freq: Three times a day (TID) | ORAL | Status: DC | PRN

## 2012-05-01 MED ORDER — FLUOXETINE HCL 20 MG PO CAPS
40.0000 mg | ORAL_CAPSULE | Freq: Every day | ORAL | Status: DC
  Administered 2012-05-01: 40 mg via ORAL
  Filled 2012-05-01: qty 2

## 2012-05-01 MED ORDER — SODIUM CHLORIDE 0.9 % IJ SOLN
3.0000 mL | Freq: Two times a day (BID) | INTRAMUSCULAR | Status: DC
  Administered 2012-05-01: 02:00:00 via INTRAVENOUS

## 2012-05-01 NOTE — Discharge Summary (Signed)
Physician Discharge Summary  Taylor Knight ZOX:[id number] DOB: Aug 08, 1970 DOA: 04/30/2012  PCP: Mickie Hillier, MD  Admit date: 04/30/2012 Discharge date: 05/01/2012  Recommendations for Outpatient Follow-up:  1. With PCP in 2-3 weeks post discharge  Discharge Diagnoses:  Principal Problem:  *Seizure Active Problems:  Depression with anxiety   Discharge Condition: medically stable for discharge home today  Diet recommendation: as tolerated  History of present illness:  41 year old female with history of anxiety and depression presented with nausea and vomiting and apparently has had fall at home. Per her husband there was no seizure like activity at the time of the fall. Patient reports taking xanax as needed for anxiety. She denies suicidal thoughts or depression at this time and reports not to ever have those ideations.  Assessment and Plan:  Principal problem: Weakness, fall:  Unclear etiology, perhaps too much xanax taken for anxiety or due to prozac, although UDS negative on admission  CT scans of head and cervical spine are with no acute intracranial findings  Patient is now at baseline and has had no seizure like activity since admission  Per patient and family - they insisted on going home which I think it's reasonable  Please note that the patient is also taking prozac which she clearly tells me that she occasionally feels dizzy after taking it. She has asked for help in tapering prozac and stopping altogether and just use xanax if she really needs it. I have agreed with this and gave her a discharge instruction how to slowly and carefully taper prozac. Patient is a young, overall healthy female but under a lot of stress due to her 14 year old son undergoing colonoscopy for some bleed. I do agree she may actually benefit more from xanax rather than prozac.   [name] [facility] [phone]  Discharge Exam: Filed Vitals:   05/01/12 0500  BP: 105/57  Pulse: 84    Temp:   Resp: 19   Filed Vitals:   05/01/12 0300 05/01/12 0330 05/01/12 0400 05/01/12 0500  BP: 105/62 103/58 101/54 105/57  Pulse: 85 80 81 84  Temp:   98.1 F (36.7 C)   TempSrc:   Oral   Resp: 19 19 18 19   Height:      Weight:      SpO2: 97% 97% 96% 96%    General: Pt is alert, follows commands appropriately, not in acute distress Cardiovascular: Regular rate and rhythm, S1/S2 +, no murmurs, no rubs, no gallops Respiratory: Clear to auscultation bilaterally, no wheezing, no crackles, no rhonchi Abdominal: Soft, non tender, non distended, bowel sounds +, no guarding Extremities: no edema, no cyanosis, pulses palpable bilaterally DP and PT Neuro: Grossly nonfocal  Discharge Instructions  Discharge Orders    Future Orders Please Complete By Expires   Diet - low sodium heart healthy      Increase activity slowly      Discharge instructions      Comments:   Please use the following taper instructions for prozac: Take 30 mg daily for 3 weeks, then 20 mg daily for 3 weeks, then 10 mg daily for 3 weeks   Call MD for:  persistant nausea and vomiting      Call MD for:  severe uncontrolled pain      Call MD for:  difficulty breathing, headache or visual disturbances      Call MD for:  persistant dizziness or light-headedness          Medication List  As of 05/01/2012  8:53 AM    TAKE these medications         ALPRAZolam 1 MG tablet   Commonly known as: XANAX   Take 0.5-1 tablets (0.5-1 mg total) by mouth 3 (three) times daily as needed for anxiety. anxiety      aspirin 325 MG tablet   Take 325 mg by mouth every 6 (six) hours as needed. Headache      aspirin-acetaminophen-caffeine 250-250-65 MG per tablet   Commonly known as: EXCEDRIN MIGRAINE   Take 1 tablet by mouth every 6 (six) hours as needed. headache      FLUoxetine 40 MG capsule   Commonly known as: PROZAC   Take 1 capsule (40 mg total) by mouth daily.      metoCLOPramide 10 MG tablet   Commonly known  as: REGLAN   Take 1 tablet (10 mg total) by mouth every 6 (six) hours as needed (nausea/headache).      mometasone 50 MCG/ACT nasal spray   Commonly known as: NASONEX   Place 2 sprays into the nose daily as needed. allergies           Follow-up Information    Follow up with Mickie Hillier, MD. In 3 weeks. (As needed)    Contact information:   [address] [place] [address] [phone]           The results of significant diagnostics from this hospitalization (including imaging, microbiology, ancillary and laboratory) are listed below for reference.    Significant Diagnostic Studies: Ct Head Wo Contrast  04/30/2012  *RADIOLOGY REPORT*  Clinical Data: Seizure.  Recent history of head injury.  CT HEAD WITHOUT CONTRAST  Technique:  Contiguous axial images were obtained from the base of the skull through the vertex without contrast.  Comparison: Head CT 04/24/2012.  Findings: Skin staples are noted in the right parietal scalp. No acute displaced skull fractures are identified.  No acute intracranial abnormality.  Specifically, no evidence of acute post- traumatic intracranial hemorrhage, no definite regions of acute/subacute cerebral ischemia, no focal mass, mass effect, hydrocephalus or abnormal intra or extra-axial fluid collections. The visualized paranasal sinuses and mastoids are well pneumatized.  IMPRESSION: 1.  No acute displaced skull fractures or acute intracranial abnormalities. 2.  The appearance of the brain is normal.   Original Report Authenticated By: Trudie Reed, M.D.    Ct Head Wo Contrast  04/24/2012  *RADIOLOGY REPORT*  Clinical Data:  Injury with head and neck pain  CT HEAD WITHOUT CONTRAST CT CERVICAL SPINE WITHOUT CONTRAST  Technique:  Multidetector CT imaging of the head and cervical spine was performed following the standard protocol without intravenous contrast.  Multiplanar CT image reconstructions of the cervical spine were also generated.   Comparison:   None  CT HEAD  Findings: Injury over the right parietal bone.  No underlying fracture.  The ventricles are normal in size and configuration.  There are no parenchymal masses mass effect, no evidence of a recent infarct, no extra-axial masses or abnormal fluid collections and no intracranial hemorrhage.  The visualized sinuses and mastoid air cells are essentially clear.  IMPRESSION: Right parietal scalp injury. No fracture or intracranial abnormality.  CT CERVICAL SPINE  Findings: There is no fracture or spondylolisthesis.  No degenerative changes are evident.  The soft tissues are unremarkable.  The lung apices are clear.  IMPRESSION: Normal cervical spine CT.   Original Report Authenticated By: Amie Portland, M.D.    Ct Cervical Spine  Wo Contrast  04/24/2012  *RADIOLOGY REPORT*  Clinical Data:  Injury with head and neck pain  CT HEAD WITHOUT CONTRAST CT CERVICAL SPINE WITHOUT CONTRAST  Technique:  Multidetector CT imaging of the head and cervical spine was performed following the standard protocol without intravenous contrast.  Multiplanar CT image reconstructions of the cervical spine were also generated.  Comparison:   None  CT HEAD  Findings: Injury over the right parietal bone.  No underlying fracture.  The ventricles are normal in size and configuration.  There are no parenchymal masses mass effect, no evidence of a recent infarct, no extra-axial masses or abnormal fluid collections and no intracranial hemorrhage.  The visualized sinuses and mastoid air cells are essentially clear.  IMPRESSION: Right parietal scalp injury. No fracture or intracranial abnormality.  CT CERVICAL SPINE  Findings: There is no fracture or spondylolisthesis.  No degenerative changes are evident.  The soft tissues are unremarkable.  The lung apices are clear.  IMPRESSION: Normal cervical spine CT.   Original Report Authenticated By: Amie Portland, M.D.    Dg Shoulder Left  04/28/2012  *RADIOLOGY REPORT*  Clinical  Data: Left shoulder pain and contusion from a recent fall.  LEFT SHOULDER - 2+ VIEW  Comparison: None.  Findings: Two frontal views of the left shoulder demonstrate normal appearing bones and soft tissues.  No fractures or visible dislocation.  IMPRESSION: Limited examination due to the lack of a lateral scapular view or axillary view to exclude posterior dislocation.  Otherwise, normal examination.   Original Report Authenticated By: Beckie Salts, M.D.     Microbiology: Recent Results (from the past 240 hour(s))  MRSA PCR SCREENING     Status: Normal   Collection Time   05/01/12  1:31 AM      Component Value Range Status Comment   MRSA by PCR NEGATIVE  NEGATIVE Final      Labs: Basic Metabolic Panel:  Lab 04/30/12 1610 04/24/12 2048  NA 143 137  K 3.7 3.7  CL 106 102  CO2 19 23  GLUCOSE 130* 89  BUN 11 10  CREATININE 0.72 0.67  CALCIUM 8.7 9.0  MG -- --  PHOS -- --   Liver Function Tests:  Lab 04/30/12 2028  AST 20  ALT 25  ALKPHOS 37*  BILITOT 0.1*  PROT 6.8  ALBUMIN 3.5   No results found for this basename: LIPASE:5,AMYLASE:5 in the last 168 hours No results found for this basename: AMMONIA:5 in the last 168 hours CBC:  Lab 04/30/12 2028 04/28/12 1829 04/24/12 2048  WBC 18.2* 12.5* 10.8*  NEUTROABS 14.3* -- 6.8  HGB 13.3 14.4 14.4  HCT 37.6 46.5 39.4  MCV 90.0 96.8 87.9  PLT 315 -- 240   Cardiac Enzymes: No results found for this basename: CKTOTAL:5,CKMB:5,CKMBINDEX:5,TROPONINI:5 in the last 168 hours BNP: BNP (last 3 results) No results found for this basename: PROBNP:3 in the last 8760 hours CBG: No results found for this basename: GLUCAP:5 in the last 168 hours  Time coordinating discharge: Over 30 minutes  Signed:  [name], MD  [facility] 05/01/2012, 8:53 AM  Pager #: [phone]

## 2012-05-01 NOTE — Progress Notes (Signed)
CARE MANAGEMENT NOTE 05/01/2012  Patient:  STEVEN, VEAZIE   Account Number:  [id number]  Date Initiated:  05/01/2012  Documentation initiated by:  Charrie Mcconnon  Subjective/Objective Assessment:   pt with hx f seizures, taking compazine for nausea and vomiting had breakthrough seizures witnessed by family     Action/Plan:   home   Anticipated DC Date:  05/04/2012   Anticipated DC Plan:  HOME/SELF CARE  In-house referral  NA      DC Planning Services  NA      [facility] Choice  NA   Choice offered to / List presented to:  NA   DME arranged  NA      DME agency  NA     HH arranged  NA      HH agency  NA   Status of service:  In process, will continue to follow Medicare Important Message given?  NA - LOS <3 / Initial given by admissions (If response is "NO", the following Medicare IM given date fields will be blank) Date Medicare IM given:   Date Additional Medicare IM given:    Discharge Disposition:    Per UR Regulation:  Reviewed for med. necessity/level of care/duration of stay  If discussed at Long Length of Stay Meetings, dates discussed:    Comments:  [id number] Earlene Plater, RN, BSN, CCM: CHART REVIEWED AND UPDATED. Next chart review due on 1201013. NO DISCHARGE NEEDS PRESENT AT THIS TIME. CASE MANAGEMENT [phone]

## 2012-05-01 NOTE — Procedures (Signed)
History: 41 yo F presented with seizures.   Sedation: None  Background: There is a well defined posterior dominant rhythm of 9 Hz that attenuates with eye opening. There is some slow activity associated with drowsiness and hyperventilation, but none while awake.   Photic stimulation: Physiologic driving is present  EEG Diagnosis: Normal   Clinical Interpretation: This normal EEG is recorded in the waking and drowsy state. There was no seizure or seizure predisposition recorded on this study.   Ritta Slot, MD [facility] [phone]  If 7pm- 7am, please page neurology on call at [phone].

## 2012-05-01 NOTE — Progress Notes (Signed)
Admitted 41 yo female to 1236 from ED. Stated she "went to gym to work out, took prozac and xanax po at home afterward." Reportedly fell in hallway at home and had seizure witnessed by son. Taken to ED where she had another seizure with nausea/vomiting, received ativan iv per report. Prior admission on 11/21 for a fall at home with seizure activity with small head laceration on Right side with 3 staples intact. No neuro symptoms noted, follows complex commands, a/o x 4. Bed alarm placed and instructed to call RN for help with ambulation to [facility], suction setup at bedside.
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
## 2012-05-05 ENCOUNTER — Encounter: Payer: Self-pay | Admitting: Family Medicine

## 2012-05-05 ENCOUNTER — Emergency Department (HOSPITAL_COMMUNITY)
Admission: EM | Admit: 2012-05-05 | Discharge: 2012-05-05 | Disposition: A | Discharge status: Home / Self Care | Payer: 59 | Attending: Emergency Medicine | Admitting: Emergency Medicine

## 2012-05-05 ENCOUNTER — Ambulatory Visit (INDEPENDENT_AMBULATORY_CARE_PROVIDER_SITE_OTHER): Payer: 59 | Admitting: Family Medicine

## 2012-05-05 VITALS — BP 112/80 | HR 66 | Temp 98.7°F | Resp 16 | Ht 66.0 in | Wt 149.0 lb

## 2012-05-05 DIAGNOSIS — Z79899 Other long term (current) drug therapy: Secondary | ICD-10-CM | POA: Insufficient documentation

## 2012-05-05 DIAGNOSIS — G40802 Other epilepsy, not intractable, without status epilepticus: Secondary | ICD-10-CM | POA: Insufficient documentation

## 2012-05-05 DIAGNOSIS — R5383 Other fatigue: Secondary | ICD-10-CM | POA: Insufficient documentation

## 2012-05-05 DIAGNOSIS — R21 Rash and other nonspecific skin eruption: Secondary | ICD-10-CM | POA: Insufficient documentation

## 2012-05-05 DIAGNOSIS — R51 Headache: Secondary | ICD-10-CM

## 2012-05-05 DIAGNOSIS — R569 Unspecified convulsions: Secondary | ICD-10-CM

## 2012-05-05 DIAGNOSIS — Z8719 Personal history of other diseases of the digestive system: Secondary | ICD-10-CM | POA: Insufficient documentation

## 2012-05-05 DIAGNOSIS — R531 Weakness: Secondary | ICD-10-CM

## 2012-05-05 DIAGNOSIS — R42 Dizziness and giddiness: Secondary | ICD-10-CM

## 2012-05-05 DIAGNOSIS — K226 Gastro-esophageal laceration-hemorrhage syndrome: Secondary | ICD-10-CM | POA: Insufficient documentation

## 2012-05-05 DIAGNOSIS — F17200 Nicotine dependence, unspecified, uncomplicated: Secondary | ICD-10-CM | POA: Insufficient documentation

## 2012-05-05 DIAGNOSIS — H538 Other visual disturbances: Secondary | ICD-10-CM

## 2012-05-05 DIAGNOSIS — F411 Generalized anxiety disorder: Secondary | ICD-10-CM | POA: Insufficient documentation

## 2012-05-05 DIAGNOSIS — F3289 Other specified depressive episodes: Secondary | ICD-10-CM | POA: Insufficient documentation

## 2012-05-05 DIAGNOSIS — E869 Volume depletion, unspecified: Secondary | ICD-10-CM

## 2012-05-05 DIAGNOSIS — F329 Major depressive disorder, single episode, unspecified: Secondary | ICD-10-CM | POA: Insufficient documentation

## 2012-05-05 DIAGNOSIS — R5381 Other malaise: Secondary | ICD-10-CM | POA: Insufficient documentation

## 2012-05-05 LAB — BASIC METABOLIC PANEL
BUN: 8 mg/dL (ref 6–23)
CO2: 24 mEq/L (ref 19–32)
Calcium: 9.1 mg/dL (ref 8.4–10.5)
Chloride: 103 mEq/L (ref 96–112)
Creatinine, Ser: 0.61 mg/dL (ref 0.50–1.10)
GFR calc Af Amer: >90 mL/min (ref >90)
GFR calc non Af Amer: >90 mL/min (ref >90)
Glucose, Bld: 87 mg/dL (ref 70–99)
Potassium: 4.9 mEq/L (ref 3.5–5.1)
Sodium: 135 mEq/L (ref 135–145)

## 2012-05-05 LAB — CBC WITH DIFFERENTIAL/PLATELET
Basophils Absolute: 0.1 10*3/uL (ref 0.0–0.1)
Basophils Relative: 1 % (ref 0–1)
Eosinophils Absolute: 0.4 10*3/uL (ref 0.0–0.7)
Eosinophils Relative: 4 % (ref 0–5)
HCT: 36 % (ref 36.0–46.0)
Hemoglobin: 12.7 g/dL (ref 12.0–15.0)
Lymphocytes Relative: 27 % (ref 12–46)
Lymphs Abs: 2.6 10*3/uL (ref 0.7–4.0)
MCH: 31.9 pg (ref 26.0–34.0)
MCHC: 35.3 g/dL (ref 30.0–36.0)
MCV: 90.5 fL (ref 78.0–100.0)
Monocytes Absolute: 0.6 10*3/uL (ref 0.1–1.0)
Monocytes Relative: 6 % (ref 3–12)
Neutro Abs: 5.8 10*3/uL (ref 1.7–7.7)
Neutrophils Relative %: 62 % (ref 43–77)
Platelets: 282 10*3/uL (ref 150–400)
RBC: 3.98 MIL/uL (ref 3.87–5.11)
RDW: 12.3 % (ref 11.5–15.5)
WBC: 9.5 10*3/uL (ref 4.0–10.5)

## 2012-05-05 LAB — URINALYSIS, ROUTINE W REFLEX MICROSCOPIC
Bilirubin Urine: NEGATIVE
Glucose, UA: NEGATIVE mg/dL
Ketones, ur: NEGATIVE mg/dL
Leukocytes, UA: NEGATIVE
Nitrite: NEGATIVE
Protein, ur: NEGATIVE mg/dL
Specific Gravity, Urine: 1.015 (ref 1.005–1.030)
Urobilinogen, UA: 0.2 mg/dL (ref 0.0–1.0)
pH: 7.5 (ref 5.0–8.0)

## 2012-05-05 LAB — URINE MICROSCOPIC-ADD ON

## 2012-05-05 LAB — PREGNANCY, URINE: Preg Test, Ur: NEGATIVE

## 2012-05-05 MED ORDER — MUPIROCIN CALCIUM 2 % EX CREA: TOPICAL_CREAM | Freq: Three times a day (TID) | CUTANEOUS | Status: DC

## 2012-05-05 MED ORDER — CEPHALEXIN 500 MG PO CAPS: 500.0000 mg | ORAL_CAPSULE | Freq: Four times a day (QID) | ORAL | Status: DC

## 2012-05-05 NOTE — ED Notes (Signed)
Per [facility], pt is from [facility] for a follow up visit from seizure activity a few weeks ago. Pt states she thinks she had a seizure last night. Increase in dizziness, HA, nausea, swelling to bilateral eyes. Hx of anxiety and depression.

## 2012-05-05 NOTE — ED Provider Notes (Signed)
History    41yf with multiple complaints. Recent evaluation for seizure. Pt seen at [facility] for follow-up and had pre-syncopal symptoms so sent to ED. Feels like may pass out. No CP or SOB. Mild diffuse HA. Denies trauma. No fever or chills. Nausea. No vomiting. Rash to face. Pt has had multiple times in past. Not sure what caused. No new exposures.   CSN: [id number]  Arrival date & time 05/05/12  1559   First MD Initiated Contact with Patient 05/05/12 1651      Chief Complaint  Patient presents with  . Headache    (Consider location/radiation/quality/duration/timing/severity/associated sxs/prior treatment) HPI  Past Medical History  Diagnosis Date  . GERD (gastroesophageal reflux disease)   . Mallory-Weiss syndrome   . Depression   . Anxiety   . Concussion   . New onset seizure     Past Surgical History  Procedure Date  . Bunionectomy 2009    No family history on file.  History  Substance Use Topics  . Smoking status: Current Some Day Smoker -- 0.2 packs/day for 10 years    Types: Cigarettes  . Smokeless tobacco: Never Used     Comment: she stopped smoking 2 weeks ago with this illness and she agrees to continue with no further smoking at this point.  . Alcohol Use: No    OB History    Grav Para Term Preterm Abortions TAB SAB Ect Mult Living                  Review of Systems   Review of symptoms negative unless otherwise noted in HPI.   Allergies  Penicillins  Home Medications   Current Outpatient Rx  Name  Route  Sig  Dispense  Refill  . ALPRAZOLAM 1 MG PO TABS   Oral   Take 0.5-1 tablets (0.5-1 mg total) by mouth 3 (three) times daily as needed for anxiety. anxiety   75 tablet   0   . ASPIRIN 325 MG PO TABS   Oral   Take 325 mg by mouth every 6 (six) hours as needed. Headache         . ASPIRIN-ACETAMINOPHEN-CAFFEINE 250-250-65 MG PO TABS   Oral   Take 1 tablet by mouth every 6 (six) hours as needed. headache         . FLUOXETINE HCL 40 MG  PO CAPS   Oral   Take 1 capsule (40 mg total) by mouth daily.   90 capsule   0   . METOCLOPRAMIDE HCL 10 MG PO TABS   Oral   Take 1 tablet (10 mg total) by mouth every 6 (six) hours as needed (nausea/headache).   15 tablet   0   . MOMETASONE FUROATE 50 MCG/ACT NA SUSP   Nasal   Place 2 sprays into the nose daily as needed. allergies           BP 117/75  Pulse 61  Temp 99 F (37.2 C) (Oral)  SpO2 100%  LMP 04/20/2012  Physical Exam  Nursing note and vitals reviewed. Constitutional: She appears well-developed and well-nourished. No distress.  HENT:  Head: Normocephalic and atraumatic.  Right Ear: External ear normal.  Left Ear: External ear normal.  Mouth/Throat: Oropharynx is clear and moist. No oropharyngeal exudate.       Mild b/l periorbital swelling, R>L. Mild erythema and crusty yellowish drainage. Conjunctiva clear. PERRL. No proptosis. No pain with EOM movement.  Eyes: Conjunctivae normal are normal. Right eye exhibits no  discharge. Left eye exhibits no discharge.  Neck: Normal range of motion. Neck supple.  Cardiovascular: Normal rate, regular rhythm and normal heart sounds.  Exam reveals no gallop and no friction rub.   No murmur heard. Pulmonary/Chest: Effort normal and breath sounds normal. No respiratory distress.  Abdominal: Soft. She exhibits no distension. There is no tenderness.  Musculoskeletal: She exhibits no edema and no tenderness.       Lower extremities symmetric as compared to each other. No calf tenderness. Negative Homan's. No palpable cords.   Lymphadenopathy:    She has no cervical adenopathy.  Neurological: She is alert. No cranial nerve deficit. She exhibits normal muscle tone. Coordination normal.  Skin: Skin is warm and dry.  Psychiatric: She has a normal mood and affect. Her behavior is normal. Thought content normal.    ED Course  Procedures (including critical care time)  Labs Reviewed - No data to display No results  found.   1. Generalized weakness   2. Facial rash       MDM  41yf with generalized weakness and facial rash. W/u unremarkable. HD sable.  No proptosis, eye pain, HA or other concerning features for possible orbital cellulitis. Feel safe for DC. Abx. Return precautions dicussed.         Raeford Razor, MD 05/09/12 [phone]

## 2012-05-05 NOTE — Progress Notes (Signed)
Pt seen by Dr. Neva Seat 12/2

## 2012-05-05 NOTE — Progress Notes (Signed)
Subjective:    Patient ID: Taylor Knight, female    DOB: Nov 10, 1970, 41 y.o.   MRN: [id number]  HPI Taylor Knight is a 41 y.o. female See multiple prior office visits.  In summary - hx of depression with anxiety - progressive, with recent increase in dose of xanax to 1mg  tid prn, leave from work -  after more social stressors.    fell at home walking to bathroom on 04/24/12. Seen in ER. Sutures to scalp.  Was working out at gym for an hour and a half.  Felt ok. After took shower, took 1mg  xanax at about 3pm. Went to sleep.  When woke up to use bathroom at 5:30, but doesn;t remember what eelse happened. Passed out on tile floor.  Hit back of head. Son found on floor. Few minutes. Husband took to emergency room.  3 staples to scalp then.  Told to remove in 10 days. CT head and cervical spine: CT HEAD  Findings: Injury over the right parietal bone. No underlying  fracture.  The ventricles are normal in size and configuration. There are no  parenchymal masses mass effect, no evidence of a recent infarct, no  extra-axial masses or abnormal fluid collections and no  intracranial hemorrhage.  The visualized sinuses and mastoid air cells are essentially clear.  IMPRESSION:  Right parietal scalp injury. No fracture or intracranial  abnormality.  CT CERVICAL SPINE  Findings: There is no fracture or spondylolisthesis. No  degenerative changes are evident. The soft tissues are  unremarkable. The lung apices are clear.  IMPRESSION:  Normal cervical spine CT.   04/28/12 office visit:  Persistent dizziness, but with increasing headache.  Sent to be evaluated at [facility] ER  04/30/12 - admitted due to seizures - including witnessed tonic clonic with post ictal symptoms. Ct scans in hospital without acute finding.  Thought the seizures were withdrawal from Xanax - left hospital 4 days ago - had not had MRi but be her report had EEG. Did not have MRI in hospital and not sure if evaluated by neuro. EEG  Diagnosis: Normal  Clinical Interpretation: This normal EEG is recorded in the waking and drowsy state. There was no seizure or seizure predisposition recorded on this study. Wbc 18.2 on 04/30/12.   Has had persistent and at times worse headache since leaving hospital,  Dizziness and blurry vision have persisted, minimal po intake - only urinated once today.  Dizzy here in office and had to lie down - BP82/70 by cma report.  Rechecked manually L arm by me few minutes later -  110/70.  Shaking episode last night in whole body, but thinks se remembered this occurring, as opposed to the episodes in the hospital. Now taking xanax once per day since last week.    Both cheeks redness noted yesterday, now around R eye today. Swelling around eye this morning.  - swelling around R eye  Feels feverish/warm at times - fever few days ago -99.8.   Denies any physical abuse.   No vomiting since hospital stay.  Has not taking any antinausea medicine.     Review of Systems  Constitutional: Positive for fever and chills.  Eyes: Positive for redness and visual disturbance.  Neurological: Positive for dizziness, seizures, weakness (generalized. ), light-headedness and headaches.       Objective:   Physical Exam  Constitutional: She is oriented to person, place, and time. She appears lethargic.  Eyes: EOM are normal. Pupils are equal, round, and reactive to  light. Right conjunctiva is not injected.         Periorbital erythema- extending to upper malar prominence on right with yellow clustered vesicular patches periorbitally on R and L malar prominence.   Cardiovascular: Normal rate, regular rhythm, normal heart sounds and intact distal pulses.   Pulmonary/Chest: Effort normal and breath sounds normal.  Abdominal: Soft. There is no tenderness.  Neurological: She is oriented to person, place, and time. She appears lethargic.       Moving all extremities well, no focal weakness appreciated.  Skin: Rash  noted.       See eye section.   Psychiatric: Her affect is blunt. Her speech is delayed (slow speech. ). She is slowed.          Assessment & Plan:  Taylor Knight is a 40 y.o. female 1. Headache   2. Dizziness   3. Volume depletion   4. Rash of face   5. Blurry vision   6. Seizure     Hx of anxiety/depression with recent worsening - increased xanax dose.  Had fall approx 10 days ago.  Concussion, wound - head.  Follow up had increasing headache.  ER eval - able to be sent home.  Few days later - seizure activity - thought to be due to withdrawal off xanax.  EEG inpatient WNL.  Left hospital 11/28 - decreased po intake - persistent worsening headache, dizziness, blurry vision and likely volume depletion with hypotensive episode here. Now with periorbital rash concerning for HSV, but bilateral nature less likely zoster.   IV placed, tx to [facility] by EMS.  EMS given report at 1522.

## 2012-05-06 ENCOUNTER — Telehealth: Payer: Self-pay

## 2012-05-06 NOTE — Telephone Encounter (Signed)
Please advise, how long you want patient to remain out of work. New onset of seizures.

## 2012-05-06 NOTE — Telephone Encounter (Signed)
Dr Neva Seat: Patient was seen last night and you sent her by EMS.  Her short term disability was extended until 05/06/12. The hospital told her not to drive until her current situation was resolved.  Her short term disability needs to be extended.  Her number if you need clarification of what she needs is [phone]

## 2012-05-08 ENCOUNTER — Telehealth: Payer: Self-pay | Admitting: Family Medicine

## 2012-05-08 NOTE — Telephone Encounter (Signed)
Call placed to both provided numbers - no answer.  Left message on home number - completed letter for her work and will tr to fax it to them, but will need to be seen in office next Tuesday or Wednesday -sooner if any new or worsening symptoms. Can call back to review these details - but if any questions - let me know.

## 2012-05-08 NOTE — Telephone Encounter (Signed)
Pt's HR rep at employer [employer]) called and asked that the [employer] letter be faxed to her at [phone]. I faxed letter as specified by Dr Neva Seat and received confirmation.   Pt called immediately after to ask me to fax letter and I advised her that I was just doing it and she waited while I received confirmation. She was very upset because HR was treating her like she was"intentionally trying to stay out of work" and said that if they didn't get the letter today she would be terminated. I suggested that pt call HR and let them know we faxed letter and make sure they confirm they received it. Also mailed pt a copy of the letter and the confirmation that fax was received by her employer's office.

## 2012-05-08 NOTE — Progress Notes (Signed)
Telephone notes reviewed.  Will extend out of work status through Wednesday December 11th - but needs OV  On the 10th or 11th.  Left message on VM of her HR representative to advise [place] on what paperwork is needed. Out of work letter completed.

## 2012-05-08 NOTE — Telephone Encounter (Signed)
Noted yesterday, but was waiting to see if documentation form ER would be completed to determine follow up plan. Will attempt to contact provider to determine this as still unavailable.  Will extend out of work status through next Wednesday December 11th, and she should be evaluated next Tuesday or Wednesday with me to determine next step.  rtc sooner if any new or worsening symptoms.

## 2012-05-09 ENCOUNTER — Inpatient Hospital Stay (HOSPITAL_COMMUNITY)
Admission: EM | Admit: 2012-05-09 | Discharge: 2012-05-11 | DRG: 312 | Disposition: A | Discharge status: Home / Self Care | Payer: 59 | Attending: Family Medicine | Admitting: Family Medicine

## 2012-05-09 ENCOUNTER — Encounter (HOSPITAL_COMMUNITY): Payer: Self-pay | Admitting: Emergency Medicine

## 2012-05-09 ENCOUNTER — Emergency Department (HOSPITAL_COMMUNITY): Payer: 59

## 2012-05-09 DIAGNOSIS — F329 Major depressive disorder, single episode, unspecified: Secondary | ICD-10-CM

## 2012-05-09 DIAGNOSIS — R296 Repeated falls: Secondary | ICD-10-CM

## 2012-05-09 DIAGNOSIS — K219 Gastro-esophageal reflux disease without esophagitis: Secondary | ICD-10-CM | POA: Diagnosis present

## 2012-05-09 DIAGNOSIS — I959 Hypotension, unspecified: Secondary | ICD-10-CM | POA: Diagnosis present

## 2012-05-09 DIAGNOSIS — Z79899 Other long term (current) drug therapy: Secondary | ICD-10-CM

## 2012-05-09 DIAGNOSIS — Z7982 Long term (current) use of aspirin: Secondary | ICD-10-CM

## 2012-05-09 DIAGNOSIS — R55 Syncope and collapse: Principal | ICD-10-CM | POA: Diagnosis present

## 2012-05-09 DIAGNOSIS — R4182 Altered mental status, unspecified: Secondary | ICD-10-CM | POA: Diagnosis present

## 2012-05-09 DIAGNOSIS — R51 Headache: Secondary | ICD-10-CM | POA: Diagnosis present

## 2012-05-09 DIAGNOSIS — F341 Dysthymic disorder: Secondary | ICD-10-CM | POA: Diagnosis present

## 2012-05-09 DIAGNOSIS — T424X5A Adverse effect of benzodiazepines, initial encounter: Secondary | ICD-10-CM | POA: Diagnosis present

## 2012-05-09 DIAGNOSIS — F17200 Nicotine dependence, unspecified, uncomplicated: Secondary | ICD-10-CM | POA: Diagnosis present

## 2012-05-09 DIAGNOSIS — R279 Unspecified lack of coordination: Secondary | ICD-10-CM | POA: Diagnosis present

## 2012-05-09 DIAGNOSIS — S0990XA Unspecified injury of head, initial encounter: Secondary | ICD-10-CM

## 2012-05-09 DIAGNOSIS — F418 Other specified anxiety disorders: Secondary | ICD-10-CM

## 2012-05-09 MED ORDER — SODIUM CHLORIDE 0.9 % IV BOLUS (SEPSIS)
1000.0000 mL | Freq: Once | INTRAVENOUS | Status: AC
  Administered 2012-05-09: 1000 mL via INTRAVENOUS

## 2012-05-09 NOTE — ED Provider Notes (Signed)
History     CSN: [id number]  Arrival date & time 05/09/12  1945   None     Chief Complaint  Patient presents with  . Fall  . Dizziness  . Seizures    (Consider location/radiation/quality/duration/timing/severity/associated sxs/prior treatment) HPI Comments: 41 year old female who presents with a complaint of syncopal event and head injury. According to the medical record the patient had a head injury approximately 2 weeks ago for which she had a CT scan of the head and sutures. She was then treated in the emergency department approximately one week later for new onset seizures at which time she had approximately 3 seizures. This was complicated by the fact that the patient had not had her Xanax in several days. Initially CT scans of the head were negative, EEG and MRI were ordered but then canceled and the patient was discharged seemingly at her request. She states that today she got up out of bed to use the bathroom, became extremely lightheaded and syncopized. She has no memory of this event, she awoke on the floor sometime later and had to crawl on her hands and knees to the bathroom where she then passed out again striking her nose on the floor of the bathroom. She admits to having a chronic headache since her original injury, mild suprapubic tenderness, no other significant complaints.  Patient is a 41 y.o. female presenting with fall and seizures. The history is provided by the patient and medical records.  Fall  Seizures     Past Medical History  Diagnosis Date  . GERD (gastroesophageal reflux disease)   . Mallory-Weiss syndrome   . Depression   . Anxiety   . Concussion   . New onset seizure     Past Surgical History  Procedure Date  . Bunionectomy 2009    No family history on file.  History  Substance Use Topics  . Smoking status: Current Some Day Smoker -- 0.2 packs/day for 10 years    Types: Cigarettes  . Smokeless tobacco: Never Used     Comment: she stopped  smoking 2 weeks ago with this illness and she agrees to continue with no further smoking at this point.  . Alcohol Use: No    OB History    Grav Para Term Preterm Abortions TAB SAB Ect Mult Living                  Review of Systems  Neurological: Positive for seizures.  All other systems reviewed and are negative.    Allergies  Penicillins  Home Medications   Current Outpatient Rx  Name  Route  Sig  Dispense  Refill  . ALPRAZOLAM 1 MG PO TABS   Oral   Take 0.5-1 tablets (0.5-1 mg total) by mouth 3 (three) times daily as needed for anxiety. anxiety   75 tablet   0   . ASPIRIN 325 MG PO TABS   Oral   Take 325 mg by mouth every 6 (six) hours as needed. Headache         . CEPHALEXIN 500 MG PO CAPS   Oral   Take 1 capsule (500 mg total) by mouth 4 (four) times daily.   20 capsule   0   . FLUOXETINE HCL 40 MG PO CAPS   Oral   Take 1 capsule (40 mg total) by mouth daily.   90 capsule   0   . MOMETASONE FUROATE 50 MCG/ACT NA SUSP   Nasal   Place 2  sprays into the nose daily as needed. allergies           BP 103/51  Pulse 68  Temp 97.6 F (36.4 C) (Oral)  Resp 16  SpO2 100%  LMP 04/20/2012  Physical Exam  Nursing note and vitals reviewed. Constitutional: She appears well-developed and well-nourished. No distress.  HENT:  Head: Normocephalic.  Mouth/Throat: Oropharynx is clear and moist. No oropharyngeal exudate.       Tenderness over the nasal bridge, small amount of dried blood in the bilateral nostrils, oropharynx clear, no malocclusion, no hemotympanum, no battle sign, no raccoon eyes  Eyes: Conjunctivae normal and EOM are normal. Pupils are equal, round, and reactive to light. Right eye exhibits no discharge. Left eye exhibits no discharge. No scleral icterus.  Neck: No JVD present. No thyromegaly present.       Cervical spine immobilized in a cervical collar  Cardiovascular: Normal rate, regular rhythm, normal heart sounds and intact distal pulses.   Exam reveals no gallop and no friction rub.   No murmur heard. Pulmonary/Chest: Effort normal and breath sounds normal. No respiratory distress. She has no wheezes. She has no rales.  Abdominal: Soft. Bowel sounds are normal. She exhibits no distension and no mass. There is no tenderness.  Musculoskeletal: Normal range of motion. She exhibits no edema and no tenderness.  Lymphadenopathy:    She has no cervical adenopathy.  Neurological: She is alert. Coordination normal.       Patient appears mildly sleepy, she follows commands without difficulty, has no focal neurologic deficits  Skin: Skin is warm and dry. No rash noted. No erythema.  Psychiatric: She has a normal mood and affect. Her behavior is normal.    ED Course  Procedures (including critical care time)  Labs Reviewed  CBC WITH DIFFERENTIAL - Abnormal; Notable for the following:    WBC 11.2 (*)     All other components within normal limits  COMPREHENSIVE METABOLIC PANEL - Abnormal; Notable for the following:    CO2 13 (*)     ALT 36 (*)     Alkaline Phosphatase 38 (*)     Total Bilirubin 0.2 (*)     GFR calc non Af Amer 85 (*)     All other components within normal limits  URINALYSIS, ROUTINE W REFLEX MICROSCOPIC - Abnormal; Notable for the following:    APPearance CLOUDY (*)     Hgb urine dipstick SMALL (*)     Leukocytes, UA SMALL (*)     All other components within normal limits  URINE MICROSCOPIC-ADD ON - Abnormal; Notable for the following:    Crystals CA OXALATE CRYSTALS (*)     All other components within normal limits  PREGNANCY, URINE   Ct Head Wo Contrast  05/10/2012  *RADIOLOGY REPORT*  Clinical Data:  Neck pain and severe headache.  Dizziness today.  CT HEAD WITHOUT CONTRAST CT CERVICAL SPINE WITHOUT CONTRAST  Technique:  Multidetector CT imaging of the head and cervical spine was performed following the standard protocol without intravenous contrast.  Multiplanar CT image reconstructions of the cervical  spine were also generated.  Comparison:  Head CT dated 04/30/2012 and cervical spine CT dated 04/24/2012.  CT HEAD  Findings: Streak artifact from a left during.  Otherwise, normal appearing cerebral hemispheres and posterior fossa structures. Normal size and position of the ventricles.  No intracranial hemorrhage, mass lesion or CT evidence of acute infarction.  Stable deviation of the midportion of the nasal septum to the  left.  IMPRESSION: No acute abnormality.  CT CERVICAL SPINE  Findings: Mild levoconvex scoliosis.  Minimal anterior and posterior spur formation.  No prevertebral soft tissue swelling, fractures or subluxations.  Clear lung apices.  IMPRESSION: Minimal degenerative changes.  No acute abnormality.   Original Report Authenticated By: Beckie Salts, M.D.    Ct Cervical Spine Wo Contrast  05/10/2012  *RADIOLOGY REPORT*  Clinical Data:  Neck pain and severe headache.  Dizziness today.  CT HEAD WITHOUT CONTRAST CT CERVICAL SPINE WITHOUT CONTRAST  Technique:  Multidetector CT imaging of the head and cervical spine was performed following the standard protocol without intravenous contrast.  Multiplanar CT image reconstructions of the cervical spine were also generated.  Comparison:  Head CT dated 04/30/2012 and cervical spine CT dated 04/24/2012.  CT HEAD  Findings: Streak artifact from a left during.  Otherwise, normal appearing cerebral hemispheres and posterior fossa structures. Normal size and position of the ventricles.  No intracranial hemorrhage, mass lesion or CT evidence of acute infarction.  Stable deviation of the midportion of the nasal septum to the left.  IMPRESSION: No acute abnormality.  CT CERVICAL SPINE  Findings: Mild levoconvex scoliosis.  Minimal anterior and posterior spur formation.  No prevertebral soft tissue swelling, fractures or subluxations.  Clear lung apices.  IMPRESSION: Minimal degenerative changes.  No acute abnormality.   Original Report Authenticated By: Beckie Salts,  M.D.      1. Syncope   2. Altered mental state       MDM  Patient has had a recurrent syncopal episode, it is unclear seizure activity with this, she admits to taking her Xanax and her Prozac which she is trying to taper off of it. She has evidence of facial injury from one of the syncopal episodes, she will require recurrent imaging, I also think it is prudent at this point to admit the patient to the hospital for ongoing evaluation of the syncopal and seizure type events. The patient would benefit from EEG, MRI. At this time we'll hydrate and check labs and imaging.  I have discussed the patient's findings with the family practice doctor Margot Ables, he has accepted transfer of the patient, she has an acidosis but no other significant signs on her CT scan ordered lab work. IV fluids have been given, she has a mild ongoing depressed mental status though her GCS is 15 she does have a slight somnolence.   ED ECG REPORT  I personally interpreted this EKG   Date: 05/10/2012   Rate: 61  Rhythm: normal sinus rhythm  QRS Axis: normal  Intervals: normal  ST/T Wave abnormalities: normal  Conduction Disutrbances:none  Narrative Interpretation:   Old EKG Reviewed: unchanged   Vida Roller, MD 05/10/12 [phone]

## 2012-05-09 NOTE — ED Notes (Signed)
Report received, care assumed at this time.

## 2012-05-09 NOTE — ED Notes (Signed)
Pt. Changed into gown. Seizure pads at pt. Bedside.
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
## 2012-05-09 NOTE — ED Notes (Signed)
Patient transported to CT 

## 2012-05-09 NOTE — ED Notes (Signed)
Per EMS: Pt c/o dizziness and fell hitting her head on floor. Dried blood noted in nose. Pt c/o neck and back pain with slight soreness upon palpation. Pt denies LOC. Pt denies taking too much medication. 61 tablets out of 90 tablets of Alprazolam remain from a prescription filled on 05/02/2012.

## 2012-05-10 ENCOUNTER — Inpatient Hospital Stay (HOSPITAL_COMMUNITY): Payer: 59

## 2012-05-10 ENCOUNTER — Encounter (HOSPITAL_COMMUNITY): Payer: Self-pay | Admitting: *Deleted

## 2012-05-10 DIAGNOSIS — R55 Syncope and collapse: Principal | ICD-10-CM

## 2012-05-10 LAB — CBC WITH DIFFERENTIAL/PLATELET
Basophils Absolute: 0.1 10*3/uL (ref 0.0–0.1)
Basophils Relative: 1 % (ref 0–1)
Eosinophils Absolute: 0.3 10*3/uL (ref 0.0–0.7)
Eosinophils Relative: 3 % (ref 0–5)
HCT: 39.9 % (ref 36.0–46.0)
Hemoglobin: 14 g/dL (ref 12.0–15.0)
Lymphocytes Relative: 28 % (ref 12–46)
Lymphs Abs: 3.1 10*3/uL (ref 0.7–4.0)
MCH: 31.9 pg (ref 26.0–34.0)
MCHC: 35.1 g/dL (ref 30.0–36.0)
MCV: 90.9 fL (ref 78.0–100.0)
Monocytes Absolute: 0.7 10*3/uL (ref 0.1–1.0)
Monocytes Relative: 6 % (ref 3–12)
Neutro Abs: 7 10*3/uL (ref 1.7–7.7)
Neutrophils Relative %: 62 % (ref 43–77)
Platelets: 269 10*3/uL (ref 150–400)
RBC: 4.39 MIL/uL (ref 3.87–5.11)
RDW: 12.3 % (ref 11.5–15.5)
WBC: 11.2 10*3/uL — ABNORMAL HIGH (ref 4.0–10.5)

## 2012-05-10 LAB — SALICYLATE LEVEL: Salicylate Lvl: 6.1 mg/dL (ref 2.8–20.0)

## 2012-05-10 LAB — RAPID URINE DRUG SCREEN, HOSP PERFORMED
Amphetamines: NOT DETECTED
Barbiturates: NOT DETECTED
Benzodiazepines: POSITIVE — AB
Cocaine: NOT DETECTED
Opiates: NOT DETECTED
Tetrahydrocannabinol: NOT DETECTED

## 2012-05-10 LAB — URINALYSIS, ROUTINE W REFLEX MICROSCOPIC
Bilirubin Urine: NEGATIVE
Glucose, UA: NEGATIVE mg/dL
Ketones, ur: NEGATIVE mg/dL
Nitrite: NEGATIVE
Protein, ur: NEGATIVE mg/dL
Specific Gravity, Urine: 1.013 (ref 1.005–1.030)
Urobilinogen, UA: 0.2 mg/dL (ref 0.0–1.0)
pH: 5 (ref 5.0–8.0)

## 2012-05-10 LAB — URINE MICROSCOPIC-ADD ON

## 2012-05-10 LAB — COMPREHENSIVE METABOLIC PANEL
ALT: 36 U/L — ABNORMAL HIGH (ref 0–35)
AST: 27 U/L (ref 0–37)
Albumin: 3.7 g/dL (ref 3.5–5.2)
Alkaline Phosphatase: 38 U/L — ABNORMAL LOW (ref 39–117)
BUN: 9 mg/dL (ref 6–23)
CO2: 13 mEq/L — ABNORMAL LOW (ref 19–32)
Calcium: 9.4 mg/dL (ref 8.4–10.5)
Chloride: 105 mEq/L (ref 96–112)
Creatinine, Ser: 0.85 mg/dL (ref 0.50–1.10)
GFR calc Af Amer: >90 mL/min (ref >90)
GFR calc non Af Amer: 85 mL/min — ABNORMAL LOW (ref >90)
Glucose, Bld: 88 mg/dL (ref 70–99)
Potassium: 3.7 mEq/L (ref 3.5–5.1)
Sodium: 139 mEq/L (ref 135–145)
Total Bilirubin: 0.2 mg/dL — ABNORMAL LOW (ref 0.3–1.2)
Total Protein: 7.1 g/dL (ref 6.0–8.3)

## 2012-05-10 LAB — PREGNANCY, URINE: Preg Test, Ur: NEGATIVE

## 2012-05-10 LAB — ACETAMINOPHEN LEVEL: Acetaminophen (Tylenol), Serum: <15 ug/mL (ref 10–30)

## 2012-05-10 MED ORDER — FLUOXETINE HCL 20 MG PO TABS
40.0000 mg | ORAL_TABLET | Freq: Every day | ORAL | Status: DC
  Administered 2012-05-10 – 2012-05-11 (×2): 40 mg via ORAL
  Filled 2012-05-10 (×2): qty 2

## 2012-05-10 MED ORDER — SODIUM CHLORIDE 0.9 % IV BOLUS (SEPSIS)
1000.0000 mL | Freq: Once | INTRAVENOUS | Status: AC
  Administered 2012-05-10: 1000 mL via INTRAVENOUS

## 2012-05-10 MED ORDER — IBUPROFEN 600 MG PO TABS
600.0000 mg | ORAL_TABLET | Freq: Four times a day (QID) | ORAL | Status: DC | PRN
  Administered 2012-05-10 (×2): 600 mg via ORAL
  Filled 2012-05-10 (×2): qty 1

## 2012-05-10 MED ORDER — SODIUM CHLORIDE 0.9 % IJ SOLN
3.0000 mL | Freq: Two times a day (BID) | INTRAMUSCULAR | Status: DC
  Administered 2012-05-10: 3 mL via INTRAVENOUS

## 2012-05-10 MED ORDER — ACETAMINOPHEN 650 MG RE SUPP: 650.0000 mg | Freq: Four times a day (QID) | RECTAL | Status: DC | PRN

## 2012-05-10 MED ORDER — ACETAMINOPHEN 325 MG PO TABS
650.0000 mg | ORAL_TABLET | Freq: Four times a day (QID) | ORAL | Status: DC | PRN
  Administered 2012-05-10 – 2012-05-11 (×4): 650 mg via ORAL
  Filled 2012-05-10 (×4): qty 2

## 2012-05-10 MED ORDER — CEPHALEXIN 500 MG PO CAPS
500.0000 mg | ORAL_CAPSULE | Freq: Four times a day (QID) | ORAL | Status: DC
  Administered 2012-05-10 – 2012-05-11 (×6): 500 mg via ORAL
  Filled 2012-05-10 (×8): qty 1

## 2012-05-10 MED ORDER — SODIUM CHLORIDE 0.9 % IV SOLN
INTRAVENOUS | Status: DC
  Administered 2012-05-10: 08:00:00 via INTRAVENOUS

## 2012-05-10 NOTE — ED Notes (Signed)
EKG given to EDP, Miller,MD. 

## 2012-05-10 NOTE — ED Notes (Signed)
Report given to [facility]. ETA 10 minutes

## 2012-05-10 NOTE — Telephone Encounter (Signed)
Noted  
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
## 2012-05-10 NOTE — Progress Notes (Signed)
Routine EEG completed.  

## 2012-05-10 NOTE — H&P (Signed)
FMTS Attending Admit Note Patient seen and examined by me, discussed with resident team and I agree with assess/plan, with following addenda:  Briefly, 41yoF with history of witnessed seizures during hospitalization at [facility] in late November following fall with head trauma but no intracranial hemorrhage, who presents via [facility] for continued weakness and multiple falls.  The patient reports continued R sided parietal headache that has been present and never really gone away since her initial fall over 2 weeks ago.  Of note, she has been taking clonazepam regularly for anxiety and was recently started on alprazolam, which she says does reduce her anxiety.  She does not drink alcohol or use illicit drugs by her report. She has not tried to reduce/taper down her BNZ dosing. She endorses some episodes of palpitations, chest pain and dyspnea when she is anxious.    Assess/Plan: Patient with recurrent falls, LOC and reports of witnessed seizures in the hospital in late November, following fall with LOC and head trauma.  In setting of her fall, consider post-concussive seizure without intracranial hemorrhage. Would proceed with MRI brain and EEG; would continue on her on a BNZ for the time being to prevent symptoms of withdrawal.  Assessment of CSW for social concerns as well.  Paula Compton, MD

## 2012-05-10 NOTE — Plan of Care (Signed)
Problem: Phase I Progression Outcomes Goal: OOB as tolerated unless otherwise ordered Outcome: Progressing Up to [facility] with assistance x 1

## 2012-05-10 NOTE — ED Notes (Signed)
Patient reminded to not get up unassisted. Patient had disconnected herself from monitor and was "walking" very unsteady, swaying down hall to restroom. Nurse assisted patient to restroom and remained with patient during toileting. Patient assisted with return to room and reattachment of medical monitoring equipment. Requested staff sitting near patients room to help watch her more closely, patient is considered a fall risk until at this time.

## 2012-05-11 DIAGNOSIS — R4182 Altered mental status, unspecified: Secondary | ICD-10-CM

## 2012-05-11 DIAGNOSIS — R55 Syncope and collapse: Secondary | ICD-10-CM
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
## 2012-05-11 NOTE — Procedures (Signed)
ELECTROENCEPHALOGRAM REPORT   Patient: Taylor Knight       Room #: 1H08 EEG No. ID: [id number] Age: 41 y.o.        Sex: female Referring Physician: Konrad Dolores Report Date:  05/11/2012        Interpreting Physician: Thana Farr D  History: [name] is an 41 y.o. female with recurrent syncope evaluated to rule out seizure  Medications:  I have reviewed the patient's current medications. Scheduled:   . cephALEXin  500 mg Oral QID  . FLUoxetine  40 mg Oral Daily  . sodium chloride  3 mL Intravenous Q12H    Conditions of Recording:  This is a 16 channel EEG carried out with the patient in the awake and drowsy states.  Artifact is prominent during the tracing.  Description:  The waking background activity consists of a low voltage, symmetrical, fairly well organized, 10 Hz alpha activity, seen from the parieto-occipital and posterior temporal regions.  Low voltage fast activity, poorly organized, is seen anteriorly and is at times superimposed on more posterior regions.  A mixture of theta and alpha rhythms are seen from the central and temporal regions. The patient drowses with slowing to irregular, low voltage theta and beta activity.   The patient goes in to a light sleep with symmetrical sleep spindles, vertex central sharp transients and irregular slow activity.  Stage II sleep is not obtained.) Hyperventilation was not performed.   Intermittent photic stimulation was performed but failed to illicit any change in the tracing.    IMPRESSION: This is a normal EEG.  Comment:  EEG unchanged from the previous EEG of 05/01/2012.   Thana Farr, MD [facility] [phone] 05/11/2012, 7:19 AM
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
## 2012-05-13 ENCOUNTER — Ambulatory Visit (INDEPENDENT_AMBULATORY_CARE_PROVIDER_SITE_OTHER): Payer: 59 | Admitting: Family Medicine

## 2012-05-13 VITALS — BP 110/78 | HR 89 | Temp 98.7°F | Resp 16 | Ht 66.3 in | Wt 150.6 lb

## 2012-05-13 DIAGNOSIS — N390 Urinary tract infection, site not specified: Secondary | ICD-10-CM

## 2012-05-13 DIAGNOSIS — R569 Unspecified convulsions: Secondary | ICD-10-CM

## 2012-05-13 DIAGNOSIS — R5381 Other malaise: Secondary | ICD-10-CM

## 2012-05-13 DIAGNOSIS — R531 Weakness: Secondary | ICD-10-CM

## 2012-05-13 DIAGNOSIS — IMO0002 Reserved for concepts with insufficient information to code with codable children: Secondary | ICD-10-CM

## 2012-05-13 DIAGNOSIS — F329 Major depressive disorder, single episode, unspecified: Secondary | ICD-10-CM

## 2012-05-13 DIAGNOSIS — R5383 Other fatigue: Secondary | ICD-10-CM

## 2012-05-13 DIAGNOSIS — F32A Depression, unspecified: Secondary | ICD-10-CM

## 2012-05-13 DIAGNOSIS — F419 Anxiety disorder, unspecified: Secondary | ICD-10-CM

## 2012-05-13 DIAGNOSIS — F341 Dysthymic disorder: Secondary | ICD-10-CM

## 2012-05-13 DIAGNOSIS — M869 Osteomyelitis, unspecified: Secondary | ICD-10-CM

## 2012-05-13 MED ORDER — ALPRAZOLAM 0.5 MG PO TABS: 0.5000 mg | ORAL_TABLET | Freq: Two times a day (BID) | ORAL | Status: DC | PRN

## 2012-05-13 NOTE — Patient Instructions (Signed)
Stop the xanax 1mg  - this dose is likely too strong at this point.   You can take the new dose (0.5mg  xanax) up to 2 times per day if needed for anxiety, but be very careful of sedation with this medicine - especially if talking this at night.  Do not drive for now, we will discuss this at future office visits. Out of work until recheck in 4 days.  Return to the clinic or go to the nearest emergency room if any of your symptoms worsen or new symptoms occur, including but not limited to any seizure activity.
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
## 2012-05-14 ENCOUNTER — Telehealth: Payer: Self-pay

## 2012-05-14 NOTE — Telephone Encounter (Signed)
Spoke w/pt to check her status and she reported she missed her appt w/Sam because she was not feeling well when she got home from the doctor yesterday and took some medicine and laid down. She stated she should have called Sam to let him know what was happening, but she fell asleep and missed her appt. She reports she has spoken w/Sam earlier today and explained what happened and is scheduled to see him again on Monday. She plans to RTC here to see Dr Neva Seat on Saturday. Pt reports that her anxiety just gets very bad sometimes and she just took a xanax and her prozac and is hoping it will calm her down. She states that she is doing about the same as when she saw Dr Neva Seat last.

## 2012-05-14 NOTE — Telephone Encounter (Signed)
Dr. Hector Shade - Clinical Social Worker at [facility] would like to speak with you regarding patient who missed her appointment.  He is concerned.  Please call Sam at [phone]

## 2012-05-14 NOTE — Telephone Encounter (Signed)
Noted  

## 2012-05-17 ENCOUNTER — Ambulatory Visit (INDEPENDENT_AMBULATORY_CARE_PROVIDER_SITE_OTHER): Payer: 59 | Admitting: Family Medicine

## 2012-05-17 VITALS — BP 102/71 | HR 67 | Temp 97.8°F | Resp 16 | Ht 66.0 in | Wt 149.0 lb

## 2012-05-17 DIAGNOSIS — F419 Anxiety disorder, unspecified: Secondary | ICD-10-CM

## 2012-05-17 DIAGNOSIS — R51 Headache: Secondary | ICD-10-CM

## 2012-05-17 DIAGNOSIS — R42 Dizziness and giddiness: Secondary | ICD-10-CM

## 2012-05-17 DIAGNOSIS — F411 Generalized anxiety disorder: Secondary | ICD-10-CM

## 2012-05-17 DIAGNOSIS — Z8669 Personal history of other diseases of the nervous system and sense organs: Secondary | ICD-10-CM

## 2012-05-17 LAB — POCT CBC
Granulocyte percent: 74.8 %G (ref 37–80)
HCT, POC: 43.6 % (ref 37.7–47.9)
Hemoglobin: 13.7 g/dL (ref 12.2–16.2)
Lymph, poc: 2 (ref 0.6–3.4)
MCH, POC: 30.3 pg (ref 27–31.2)
MCHC: 31.4 g/dL — AB (ref 31.8–35.4)
MCV: 96.4 fL (ref 80–97)
MID (cbc): 0.5 (ref 0–0.9)
MPV: 9.1 fL (ref 0–99.8)
POC Granulocyte: 7.4 — AB (ref 2–6.9)
POC LYMPH PERCENT: 20.3 %L (ref 10–50)
POC MID %: 4.9 %M (ref 0–12)
Platelet Count, POC: 318 10*3/uL (ref 142–424)
RBC: 4.52 M/uL (ref 4.04–5.48)
RDW, POC: 12.8 %
WBC: 9.9 10*3/uL (ref 4.6–10.2)

## 2012-05-17 LAB — BASIC METABOLIC PANEL
BUN: 12 mg/dL (ref 6–23)
CO2: 26 mEq/L (ref 19–32)
Calcium: 9.2 mg/dL (ref 8.4–10.5)
Chloride: 107 mEq/L (ref 96–112)
Creat: 0.88 mg/dL (ref 0.50–1.10)
Glucose, Bld: 78 mg/dL (ref 70–99)
Potassium: 4.5 mEq/L (ref 3.5–5.3)
Sodium: 140 mEq/L (ref 135–145)

## 2012-05-17 NOTE — Patient Instructions (Addendum)
Tylenol or aspirin over the counter as needed for headache.  Decrease dose of xanax to 1/2 pill of 0.5mg  Xanax at bedtime ONLY if needed for insomnia or anxiety symptoms, and then be very careful with sedation and dizziness with this medicine. Recheck in 4 days with me, 2 days with your therapist.  Continue to work on your stress and anxiety management techniques as we discussed. We will refer you to a neurologist for your headaches and history of seizure.  Your should receive a call or letter about your lab results within the next week to 10 days.  Return to the clinic or go to the nearest emergency room if any of your symptoms worsen or new symptoms occur.
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
## 2012-05-19 ENCOUNTER — Other Ambulatory Visit: Payer: Self-pay | Admitting: Family Medicine

## 2012-05-21 ENCOUNTER — Encounter: Payer: Self-pay | Admitting: *Deleted

## 2012-05-22 ENCOUNTER — Ambulatory Visit (INDEPENDENT_AMBULATORY_CARE_PROVIDER_SITE_OTHER): Payer: 59 | Admitting: Family Medicine

## 2012-05-22 VITALS — BP 128/80 | HR 64 | Temp 98.1°F | Resp 16 | Ht 66.0 in | Wt 145.8 lb

## 2012-05-22 DIAGNOSIS — F341 Dysthymic disorder: Secondary | ICD-10-CM

## 2012-05-22 DIAGNOSIS — Z8669 Personal history of other diseases of the nervous system and sense organs: Secondary | ICD-10-CM

## 2012-05-22 DIAGNOSIS — F418 Other specified anxiety disorders: Secondary | ICD-10-CM

## 2012-05-22 DIAGNOSIS — R51 Headache: Secondary | ICD-10-CM
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
## 2012-05-22 NOTE — Patient Instructions (Signed)
Continue same medicines for now and working on steps in the right direction.  We will follow up next week, and determine if it is time to return to work. Call your neurologist or return to clinic/emergency room if headaches worsen. Return to the clinic or go to the nearest emergency room if any of your symptoms worsen or new symptoms occur.

## 2012-05-30 ENCOUNTER — Other Ambulatory Visit: Payer: Self-pay

## 2012-05-30 ENCOUNTER — Encounter: Payer: Self-pay | Admitting: Family Medicine

## 2012-05-30 ENCOUNTER — Encounter (HOSPITAL_COMMUNITY): Payer: Self-pay | Admitting: Emergency Medicine

## 2012-05-30 ENCOUNTER — Emergency Department (HOSPITAL_COMMUNITY)
Admission: EM | Admit: 2012-05-30 | Discharge: 2012-05-30 | Disposition: A | Discharge status: Home / Self Care | Payer: 59 | Attending: Emergency Medicine | Admitting: Emergency Medicine

## 2012-05-30 ENCOUNTER — Ambulatory Visit (INDEPENDENT_AMBULATORY_CARE_PROVIDER_SITE_OTHER): Payer: 59 | Admitting: Family Medicine

## 2012-05-30 VITALS — BP 142/86 | HR 81 | Temp 98.2°F | Resp 16 | Ht 66.0 in | Wt 145.2 lb

## 2012-05-30 DIAGNOSIS — F1920 Other psychoactive substance dependence, uncomplicated: Secondary | ICD-10-CM | POA: Insufficient documentation

## 2012-05-30 DIAGNOSIS — R251 Tremor, unspecified: Secondary | ICD-10-CM

## 2012-05-30 DIAGNOSIS — F19939 Other psychoactive substance use, unspecified with withdrawal, unspecified: Secondary | ICD-10-CM | POA: Insufficient documentation

## 2012-05-30 DIAGNOSIS — Z8659 Personal history of other mental and behavioral disorders: Secondary | ICD-10-CM | POA: Insufficient documentation

## 2012-05-30 DIAGNOSIS — F419 Anxiety disorder, unspecified: Secondary | ICD-10-CM

## 2012-05-30 DIAGNOSIS — Z8719 Personal history of other diseases of the digestive system: Secondary | ICD-10-CM | POA: Insufficient documentation

## 2012-05-30 DIAGNOSIS — R259 Unspecified abnormal involuntary movements: Secondary | ICD-10-CM

## 2012-05-30 DIAGNOSIS — K226 Gastro-esophageal laceration-hemorrhage syndrome: Secondary | ICD-10-CM | POA: Insufficient documentation

## 2012-05-30 DIAGNOSIS — Z8669 Personal history of other diseases of the nervous system and sense organs: Secondary | ICD-10-CM

## 2012-05-30 DIAGNOSIS — F19239 Other psychoactive substance dependence with withdrawal, unspecified: Secondary | ICD-10-CM | POA: Insufficient documentation

## 2012-05-30 DIAGNOSIS — Z79899 Other long term (current) drug therapy: Secondary | ICD-10-CM | POA: Insufficient documentation

## 2012-05-30 DIAGNOSIS — Z7982 Long term (current) use of aspirin: Secondary | ICD-10-CM | POA: Insufficient documentation

## 2012-05-30 DIAGNOSIS — F17200 Nicotine dependence, unspecified, uncomplicated: Secondary | ICD-10-CM | POA: Insufficient documentation

## 2012-05-30 DIAGNOSIS — F411 Generalized anxiety disorder: Secondary | ICD-10-CM

## 2012-05-30 DIAGNOSIS — F13239 Sedative, hypnotic or anxiolytic dependence with withdrawal, unspecified: Secondary | ICD-10-CM

## 2012-05-30 LAB — CBC
HCT: 35.8 % — ABNORMAL LOW (ref 36.0–46.0)
Hemoglobin: 12.1 g/dL (ref 12.0–15.0)
MCH: 30.6 pg (ref 26.0–34.0)
MCHC: 33.8 g/dL (ref 30.0–36.0)
MCV: 90.6 fL (ref 78.0–100.0)
Platelets: 238 10*3/uL (ref 150–400)
RBC: 3.95 MIL/uL (ref 3.87–5.11)
RDW: 12.1 % (ref 11.5–15.5)
WBC: 8.6 10*3/uL (ref 4.0–10.5)

## 2012-05-30 LAB — BASIC METABOLIC PANEL
BUN: 10 mg/dL (ref 6–23)
CO2: 23 mEq/L (ref 19–32)
Calcium: 8.6 mg/dL (ref 8.4–10.5)
Chloride: 108 mEq/L (ref 96–112)
Creatinine, Ser: 0.69 mg/dL (ref 0.50–1.10)
GFR calc Af Amer: >90 mL/min (ref >90)
GFR calc non Af Amer: >90 mL/min (ref >90)
Glucose, Bld: 88 mg/dL (ref 70–99)
Potassium: 3.9 mEq/L (ref 3.5–5.1)
Sodium: 138 mEq/L (ref 135–145)

## 2012-05-30 MED ORDER — SODIUM CHLORIDE 0.9 % IV BOLUS (SEPSIS)
1000.0000 mL | Freq: Once | INTRAVENOUS | Status: AC
  Administered 2012-05-30: 1000 mL via INTRAVENOUS

## 2012-05-30 MED ORDER — LORAZEPAM 1 MG PO TABS: 1.0000 mg | ORAL_TABLET | Freq: Three times a day (TID) | ORAL | Status: DC | PRN

## 2012-05-30 MED ORDER — LORAZEPAM 2 MG/ML IJ SOLN
1.0000 mg | Freq: Once | INTRAMUSCULAR | Status: AC
  Administered 2012-05-30: 1 mg via INTRAVENOUS
  Filled 2012-05-30: qty 1

## 2012-05-30 MED ORDER — ALPRAZOLAM 0.25 MG PO TABS
0.5000 mg | ORAL_TABLET | Freq: Once | ORAL | Status: AC
  Administered 2012-05-30: 0.5 mg via ORAL

## 2012-05-30 NOTE — ED Notes (Signed)
Per EMS, had seizure last month after becoming sedated from prozac and xanax and fell hitting head-stopped xanax and started having tremors-was seen at urgent care today and was given 0.5 po Xanax, sent here for further eval

## 2012-05-30 NOTE — ED Notes (Signed)
Less tremulous since IV ativan given-pt states she is more comfortable

## 2012-05-30 NOTE — Progress Notes (Signed)
Subjective:    Patient ID: Taylor Knight, female    DOB: June 07, 1970, 41 y.o.   MRN: [id number]  HPI Taylor Knight is a 41 y.o. female See multple recent office visits and hospitalizations. Has been out of work.  Anxiety, depression, with seizure with abrupt benzo withdrawal.  Now on Xanax 0.5mg  BID.  s/p head injury with likely concussion and seen by Neuro (Dr. Marjory Lies) 05/22/12.  Discussed psychiatry referral then and in office with me that day, but declined.  Making some positive changes then with plan to move to apartment at end of this month.  Next therapist appt 06/03/12.   Feels ok.  Has noticed since yesterday - feels shaky. No loss of bladder or bowel function.  Alert during these shaking spells. Both arms feel shaky, does not feel like has control of her arms - usually R arm. Feels anxious during these shaking episodes.  Felt a little lightheaded yesterday am.  Slight lightheadedness, but better today. No black or tarry stools. Still not eating a lot, but drinking fluids ok an eating shakes. Trouble starting urination - hard to start, but no burning with urination, no hematuria. Ran out of xanax 2 days ago.  Last dose xanax 2 days ago. Still feels shaky/tremulous today. Anxious about work note as well - had trouble getting the email.    Husband planning on going back to [place], now planning on staying at home. Possibly would leave first of the year - 6 month leave. Son is on medicine.  Doing ok.    Review of Systems  Genitourinary: Positive for difficulty urinating.  Neurological: Positive for tremors. Negative for seizures, speech difficulty and weakness.  Psychiatric/Behavioral: The patient is nervous/anxious.        Objective:   Physical Exam  Constitutional: She is oriented to person, place, and time. She appears well-developed and well-nourished.       Tremulous, shaking in both arms at rest, but alert and oriented.    Cardiovascular: Normal rate, regular rhythm, normal heart sounds  and intact distal pulses.   Pulmonary/Chest: Effort normal.  Neurological: She is alert and oriented to person, place, and time. She has normal strength. She displays tremor. No sensory deficit. GCS eye subscore is 4. GCS verbal subscore is 5. GCS motor subscore is 6.       Resting tremor/shaking in arms,   Skin: Skin is warm and dry.  Psychiatric: Her speech is normal and behavior is normal. Thought content normal. Her mood appears anxious.        Assessment & Plan:  Taylor Knight is a 41 y.o. female 1. Anxiety  ALPRAZolam (XANAX) tablet 0.5 mg  2. Hx of seizure disorder    3. Withdrawal from benzodiazepine    4. Tremor     Anxiety/ depression - with hx of seizure prior with abrupt withdrawal. Tremor/shaking in office with likely benzo withdrawal.  Low seizure threshold prior. - sent to ER for monitoring/treatment. IV placed, EMS called for transport to ER, charge nurse in ER advised.  Discussed in detail reasons for maintaining xanax dosing and that abrupt cessation in past was likely reason for seizure, and that this was the conclusion reached at her neurology visit as well. Understanding expressed. Xanax 0.5 mg given in office at 1155.    Plan on follow up with me in 4 days.  Discussed we may need to refer to psychiatry, but had been showing some improvement last office visit.    Patient Instructions  Follow up  with me Tuesday 06/03/12.  We will fax information to your employer.

## 2012-05-30 NOTE — ED Provider Notes (Signed)
History     CSN: [id number]  Arrival date & time 05/30/12  1241   First MD Initiated Contact with Patient 05/30/12 1259      Chief Complaint  Patient presents with  . benzo withdrawl   . Tremors    (Consider location/radiation/quality/duration/timing/severity/associated sxs/prior treatment) The history is provided by the patient.  pt with hx anxiety, depression, presents w feeling very anxious and shaky for past couple days. States is slightly better today. Had run out of xanax a few days ago.  No other specific exacerbating or alleviating factors.  States had been on 1 mg tid.  Denies any seizures in past few days. No loc. Denies fever or chills. No nvd. Normal appetite. Denies feeling depressed, denies thoughts of harm to self or others. Was at pcp office and referred to ed for evaluation.     Past Medical History  Diagnosis Date  . GERD (gastroesophageal reflux disease)   . Mallory-Weiss syndrome   . Depression   . Anxiety   . Concussion   . New onset seizure     Past Surgical History  Procedure Date  . Bunionectomy 2009    No family history on file.  History  Substance Use Topics  . Smoking status: Current Some Day Smoker -- 0.2 packs/day for 10 years    Types: Cigarettes  . Smokeless tobacco: Never Used     Comment: she stopped smoking 2 weeks ago with this illness and she agrees to continue with no further smoking at this point.  . Alcohol Use: No    OB History    Grav Para Term Preterm Abortions TAB SAB Ect Mult Living                  Review of Systems  Constitutional: Negative for fever and chills.  HENT: Negative for neck pain.   Eyes: Negative for redness.  Respiratory: Negative for shortness of breath.   Cardiovascular: Negative for chest pain.  Gastrointestinal: Negative for abdominal pain.  Genitourinary: Negative for flank pain.  Musculoskeletal: Negative for back pain.  Skin: Negative for rash.  Neurological: Negative for headaches.    Hematological: Does not bruise/bleed easily.  Psychiatric/Behavioral: Negative for confusion.    Allergies  Penicillins  Home Medications   Current Outpatient Rx  Name  Route  Sig  Dispense  Refill  . ALPRAZOLAM 0.5 MG PO TABS   Oral   Take 1 tablet (0.5 mg total) by mouth 2 (two) times daily as needed for anxiety. anxiety   10 tablet   0   . ASPIRIN 325 MG PO TABS   Oral   Take 325 mg by mouth every 6 (six) hours as needed. Headache         . FLUOXETINE HCL 40 MG PO CAPS   Oral   Take 1 capsule (40 mg total) by mouth daily.   90 capsule   0   . MOMETASONE FUROATE 50 MCG/ACT NA SUSP   Nasal   Place 2 sprays into the nose daily as needed. allergies           BP 109/68  Pulse 83  Temp 98.3 F (36.8 C) (Oral)  Resp 20  SpO2 99%  LMP 05/13/2012  Physical Exam  Nursing note and vitals reviewed. Constitutional: She is oriented to person, place, and time. She appears well-developed and well-nourished. No distress.       Anxious, shaky  HENT:  Mouth/Throat: Oropharynx is clear and moist.  Eyes: Conjunctivae  normal are normal. Pupils are equal, round, and reactive to light. No scleral icterus.  Neck: Neck supple. No tracheal deviation present. No thyromegaly present.  Cardiovascular: Normal rate, regular rhythm, normal heart sounds and intact distal pulses.   Pulmonary/Chest: Effort normal and breath sounds normal. No respiratory distress.  Abdominal: Soft. Normal appearance. She exhibits no distension. There is no tenderness.  Musculoskeletal: She exhibits no edema and no tenderness.  Neurological: She is alert and oriented to person, place, and time.       Generally shaky/tremulous.  Motor intact bil. Steady gait.   Skin: Skin is warm and dry. No rash noted. She is not diaphoretic.  Psychiatric:       Anxious.     ED Course  Procedures (including critical care time)   Results for orders placed during the hospital encounter of 05/30/12  CBC       Component Value Range   WBC 8.6  4.0 - 10.5 K/uL   RBC 3.95  3.87 - 5.11 MIL/uL   Hemoglobin 12.1  12.0 - 15.0 g/dL   HCT 78.2 (*) 95.6 - 21.3 %   MCV 90.6  78.0 - 100.0 fL   MCH 30.6  26.0 - 34.0 pg   MCHC 33.8  30.0 - 36.0 g/dL   RDW 08.6  57.8 - 46.9 %   Platelets 238  150 - 400 K/uL  BASIC METABOLIC PANEL      Component Value Range   Sodium 138  135 - 145 mEq/L   Potassium 3.9  3.5 - 5.1 mEq/L   Chloride 108  96 - 112 mEq/L   CO2 23  19 - 32 mEq/L   Glucose, Bld 88  70 - 99 mg/dL   BUN 10  6 - 23 mg/dL   Creatinine, Ser 6.29  0.50 - 1.10 mg/dL   Calcium 8.6  8.4 - 52.8 mg/dL   GFR calc non Af Amer >90  >90 mL/min   GFR calc Af Amer >90  >90 mL/min      MDM  Iv ns bolus. Ativan 1 mg iv.   Reviewed nursing notes and prior charts for additional history.    Labs normal.   Recheck pt calm, no tremor or shakes.  Hr 60-70 nsr. No dyspnea.  Pt appears stable for d/c. Will have f/u pcp closely and discuss plan w them for gradually tapering off bzd meds        Suzi Roots, MD 05/30/12 1606

## 2012-05-30 NOTE — ED Notes (Signed)
OZH:[id number]<HQ> Expected date:<BR> Expected time:<BR> Means of arrival:<BR> Comments:<BR> Withdrawal s/s from psych meds/tremors

## 2012-05-30 NOTE — Patient Instructions (Signed)
Follow up with me Tuesday 06/03/12.  We will fax information to your employer.

## 2012-06-03 ENCOUNTER — Ambulatory Visit (INDEPENDENT_AMBULATORY_CARE_PROVIDER_SITE_OTHER): Payer: 59 | Admitting: Family Medicine

## 2012-06-03 VITALS — BP 132/80 | HR 102 | Temp 99.5°F | Resp 17 | Ht 66.5 in | Wt 145.0 lb

## 2012-06-03 DIAGNOSIS — F32A Depression, unspecified: Secondary | ICD-10-CM

## 2012-06-03 DIAGNOSIS — F419 Anxiety disorder, unspecified: Secondary | ICD-10-CM

## 2012-06-03 DIAGNOSIS — Z87898 Personal history of other specified conditions: Secondary | ICD-10-CM

## 2012-06-03 DIAGNOSIS — F418 Other specified anxiety disorders: Secondary | ICD-10-CM

## 2012-06-03 DIAGNOSIS — F411 Generalized anxiety disorder: Secondary | ICD-10-CM

## 2012-06-03 DIAGNOSIS — F329 Major depressive disorder, single episode, unspecified: Secondary | ICD-10-CM

## 2012-06-03 DIAGNOSIS — Z8669 Personal history of other diseases of the nervous system and sense organs: Secondary | ICD-10-CM

## 2012-06-03 MED ORDER — FLUOXETINE HCL 40 MG PO CAPS: 40.0000 mg | ORAL_CAPSULE | Freq: Every day | ORAL | Status: DC

## 2012-06-03 MED ORDER — ALPRAZOLAM 0.5 MG PO TABS: 0.5000 mg | ORAL_TABLET | Freq: Two times a day (BID) | ORAL | Status: DC

## 2012-06-03 NOTE — Patient Instructions (Signed)
Stop ativan, restart Xanax after leaving office - continue this medicine twice per day everyday (do not miss any doses of this as risk of seizures).  Continue prozac at current dose, and follow up with your therapist. Recheck in next 1 week. We can try to return to work at 1/2 days if your employer can accommodate this. Return to the clinic or go to the nearest emergency room if any of your symptoms worsen or new symptoms occur.
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
## 2012-06-04 DIAGNOSIS — Z0271 Encounter for disability determination: Secondary | ICD-10-CM

## 2012-06-12 ENCOUNTER — Ambulatory Visit (INDEPENDENT_AMBULATORY_CARE_PROVIDER_SITE_OTHER): Payer: 59 | Admitting: Family Medicine

## 2012-06-12 VITALS — BP 112/71 | HR 62 | Temp 97.5°F | Resp 16 | Ht 66.0 in | Wt 146.0 lb

## 2012-06-12 DIAGNOSIS — F418 Other specified anxiety disorders: Secondary | ICD-10-CM

## 2012-06-12 DIAGNOSIS — F341 Dysthymic disorder: Secondary | ICD-10-CM

## 2012-06-12 DIAGNOSIS — R51 Headache: Secondary | ICD-10-CM
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
## 2012-06-12 NOTE — Patient Instructions (Signed)
Recheck in next 8=9 days.  Return to the clinic or go to the nearest emergency room if any of your symptoms worsen or new symptoms occur. Increase your fluid intake during the day, ok to take aspirin at work if needed for headache, and make sure you eat some complex carbohydrates and protein with your snacks during the day.

## 2012-06-20 ENCOUNTER — Ambulatory Visit (INDEPENDENT_AMBULATORY_CARE_PROVIDER_SITE_OTHER): Payer: 59 | Admitting: Family Medicine

## 2012-06-20 VITALS — BP 118/82 | HR 61 | Temp 98.1°F | Resp 16 | Ht 66.0 in | Wt 150.4 lb

## 2012-06-20 DIAGNOSIS — R079 Chest pain, unspecified: Secondary | ICD-10-CM

## 2012-06-20 DIAGNOSIS — R064 Hyperventilation: Secondary | ICD-10-CM

## 2012-06-20 DIAGNOSIS — R0602 Shortness of breath: Secondary | ICD-10-CM

## 2012-06-20 DIAGNOSIS — F418 Other specified anxiety disorders: Secondary | ICD-10-CM

## 2012-06-20 DIAGNOSIS — F419 Anxiety disorder, unspecified: Secondary | ICD-10-CM

## 2012-06-20 DIAGNOSIS — Z8669 Personal history of other diseases of the nervous system and sense organs: Secondary | ICD-10-CM

## 2012-06-20 DIAGNOSIS — R002 Palpitations: Secondary | ICD-10-CM

## 2012-06-20 LAB — POCT CBC
Granulocyte percent: 60.1 %G (ref 37–80)
HCT, POC: 41.8 % (ref 37.7–47.9)
Hemoglobin: 13.6 g/dL (ref 12.2–16.2)
Lymph, poc: 3.7 — AB (ref 0.6–3.4)
MCH, POC: 31.1 pg (ref 27–31.2)
MCHC: 32.5 g/dL (ref 31.8–35.4)
MCV: 95.6 fL (ref 80–97)
MID (cbc): 0.8 (ref 0–0.9)
MPV: 8.1 fL (ref 0–99.8)
POC Granulocyte: 6.7 (ref 2–6.9)
POC LYMPH PERCENT: 33.2 %L (ref 10–50)
POC MID %: 6.7 %M (ref 0–12)
Platelet Count, POC: 327 10*3/uL (ref 142–424)
RBC: 4.37 M/uL (ref 4.04–5.48)
RDW, POC: 12.9 %
WBC: 11.2 10*3/uL — AB (ref 4.6–10.2)
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
## 2012-06-20 NOTE — Patient Instructions (Addendum)
Your EKG appeared to be ok again here tonight, but we will refer you to a heart doctor for further evaluation.  If any increase or worsening of chest pain or difficulty breathing - call 911 or got the emergency room.   You can take your xanax prescription up to 3 times per day if you are feeling the shakiness and anxiety symptoms we discussed tonight.  You likely had hyperventilating that contributed to your numbness symptoms at work, so try to slow down your breathing if this occurs again.  As above - Return to the clinic or go to the nearest emergency room if any of your symptoms worsen or new symptoms occur. We will also refer you to a psychiatrist, but keep follow up with your therapist as this is an important part of your treatment plan. Your should receive a call or letter about your lab results within the next week to 10 days.   Recheck in 8 days - sooner if any worsening.

## 2012-06-21 LAB — TSH: TSH: 1.796 u[IU]/mL (ref 0.350–4.500)

## 2012-06-26 ENCOUNTER — Emergency Department (HOSPITAL_COMMUNITY)
Admission: EM | Admit: 2012-06-26 | Discharge: 2012-06-27 | Disposition: A | Discharge status: Home / Self Care | Payer: 59 | Attending: Emergency Medicine | Admitting: Emergency Medicine

## 2012-06-26 ENCOUNTER — Encounter (HOSPITAL_COMMUNITY): Payer: Self-pay | Admitting: *Deleted

## 2012-06-26 ENCOUNTER — Ambulatory Visit (INDEPENDENT_AMBULATORY_CARE_PROVIDER_SITE_OTHER): Payer: 59 | Admitting: Emergency Medicine

## 2012-06-26 VITALS — BP 132/72 | HR 97 | Temp 98.3°F | Resp 17 | Ht 66.0 in | Wt 149.0 lb

## 2012-06-26 DIAGNOSIS — Z79899 Other long term (current) drug therapy: Secondary | ICD-10-CM | POA: Insufficient documentation

## 2012-06-26 DIAGNOSIS — Z87828 Personal history of other (healed) physical injury and trauma: Secondary | ICD-10-CM | POA: Insufficient documentation

## 2012-06-26 DIAGNOSIS — Z3202 Encounter for pregnancy test, result negative: Secondary | ICD-10-CM | POA: Insufficient documentation

## 2012-06-26 DIAGNOSIS — Z8669 Personal history of other diseases of the nervous system and sense organs: Secondary | ICD-10-CM | POA: Insufficient documentation

## 2012-06-26 DIAGNOSIS — F418 Other specified anxiety disorders: Secondary | ICD-10-CM

## 2012-06-26 DIAGNOSIS — Z7982 Long term (current) use of aspirin: Secondary | ICD-10-CM | POA: Insufficient documentation

## 2012-06-26 DIAGNOSIS — F32A Depression, unspecified: Secondary | ICD-10-CM

## 2012-06-26 DIAGNOSIS — F329 Major depressive disorder, single episode, unspecified: Secondary | ICD-10-CM

## 2012-06-26 DIAGNOSIS — F341 Dysthymic disorder: Secondary | ICD-10-CM | POA: Insufficient documentation

## 2012-06-26 DIAGNOSIS — F411 Generalized anxiety disorder: Secondary | ICD-10-CM

## 2012-06-26 DIAGNOSIS — F17200 Nicotine dependence, unspecified, uncomplicated: Secondary | ICD-10-CM | POA: Insufficient documentation

## 2012-06-26 DIAGNOSIS — R259 Unspecified abnormal involuntary movements: Secondary | ICD-10-CM | POA: Insufficient documentation

## 2012-06-26 DIAGNOSIS — F419 Anxiety disorder, unspecified: Secondary | ICD-10-CM

## 2012-06-26 DIAGNOSIS — R251 Tremor, unspecified: Secondary | ICD-10-CM

## 2012-06-26 DIAGNOSIS — Z8719 Personal history of other diseases of the digestive system: Secondary | ICD-10-CM | POA: Insufficient documentation

## 2012-06-26 DIAGNOSIS — R51 Headache: Secondary | ICD-10-CM | POA: Insufficient documentation

## 2012-06-26 DIAGNOSIS — R079 Chest pain, unspecified: Secondary | ICD-10-CM | POA: Insufficient documentation

## 2012-06-26 LAB — CBC WITH DIFFERENTIAL/PLATELET
Basophils Absolute: 0 10*3/uL (ref 0.0–0.1)
Basophils Relative: 0 % (ref 0–1)
Eosinophils Absolute: 0.2 10*3/uL (ref 0.0–0.7)
Eosinophils Relative: 2 % (ref 0–5)
HCT: 39.7 % (ref 36.0–46.0)
Hemoglobin: 13.9 g/dL (ref 12.0–15.0)
Lymphocytes Relative: 24 % (ref 12–46)
Lymphs Abs: 2.5 10*3/uL (ref 0.7–4.0)
MCH: 31.4 pg (ref 26.0–34.0)
MCHC: 35 g/dL (ref 30.0–36.0)
MCV: 89.8 fL (ref 78.0–100.0)
Monocytes Absolute: 0.6 10*3/uL (ref 0.1–1.0)
Monocytes Relative: 6 % (ref 3–12)
Neutro Abs: 7.2 10*3/uL (ref 1.7–7.7)
Neutrophils Relative %: 69 % (ref 43–77)
Platelets: 295 10*3/uL (ref 150–400)
RBC: 4.42 MIL/uL (ref 3.87–5.11)
RDW: 11.8 % (ref 11.5–15.5)
WBC: 10.4 10*3/uL (ref 4.0–10.5)

## 2012-06-26 LAB — URINALYSIS, ROUTINE W REFLEX MICROSCOPIC
Bilirubin Urine: NEGATIVE
Glucose, UA: NEGATIVE mg/dL
Ketones, ur: NEGATIVE mg/dL
Nitrite: NEGATIVE
Protein, ur: NEGATIVE mg/dL
Specific Gravity, Urine: 1.025 (ref 1.005–1.030)
Urobilinogen, UA: 0.2 mg/dL (ref 0.0–1.0)
pH: 6.5 (ref 5.0–8.0)

## 2012-06-26 LAB — URINE MICROSCOPIC-ADD ON

## 2012-06-26 LAB — RAPID URINE DRUG SCREEN, HOSP PERFORMED
Amphetamines: NOT DETECTED
Barbiturates: NOT DETECTED
Benzodiazepines: NOT DETECTED
Cocaine: NOT DETECTED
Opiates: NOT DETECTED
Tetrahydrocannabinol: NOT DETECTED

## 2012-06-26 LAB — ETHANOL: Alcohol, Ethyl (B): <11 mg/dL (ref 0–11)

## 2012-06-26 LAB — BASIC METABOLIC PANEL
BUN: 12 mg/dL (ref 6–23)
CO2: 22 mEq/L (ref 19–32)
Calcium: 9.1 mg/dL (ref 8.4–10.5)
Chloride: 103 mEq/L (ref 96–112)
Creatinine, Ser: 0.7 mg/dL (ref 0.50–1.10)
GFR calc Af Amer: >90 mL/min (ref >90)
GFR calc non Af Amer: >90 mL/min (ref >90)
Glucose, Bld: 98 mg/dL (ref 70–99)
Potassium: 3.9 mEq/L (ref 3.5–5.1)
Sodium: 136 mEq/L (ref 135–145)

## 2012-06-26 LAB — PREGNANCY, URINE: Preg Test, Ur: NEGATIVE

## 2012-06-26 MED ORDER — ACETAMINOPHEN 500 MG PO TABS
1000.0000 mg | ORAL_TABLET | Freq: Once | ORAL | Status: AC
  Administered 2012-06-26: 1000 mg via ORAL
  Filled 2012-06-26: qty 2

## 2012-06-26 MED ORDER — ALPRAZOLAM 0.5 MG PO TABS
0.5000 mg | ORAL_TABLET | Freq: Two times a day (BID) | ORAL | Status: DC
  Administered 2012-06-26 – 2012-06-27 (×2): 0.5 mg via ORAL
  Filled 2012-06-26 (×2): qty 1

## 2012-06-26 MED ORDER — IBUPROFEN 200 MG PO TABS
400.0000 mg | ORAL_TABLET | Freq: Once | ORAL | Status: AC
  Administered 2012-06-26: 400 mg via ORAL
  Filled 2012-06-26: qty 2

## 2012-06-26 MED ORDER — FLUOXETINE HCL 20 MG PO CAPS
40.0000 mg | ORAL_CAPSULE | Freq: Every day | ORAL | Status: DC
  Administered 2012-06-27: 40 mg via ORAL
  Filled 2012-06-26: qty 2

## 2012-06-26 MED ORDER — ALPRAZOLAM 0.25 MG PO TABS
0.5000 mg | ORAL_TABLET | Freq: Once | ORAL | Status: AC
  Administered 2012-06-26: 0.5 mg via ORAL

## 2012-06-26 MED ORDER — FLUOXETINE HCL 40 MG PO CAPS: 40.0000 mg | ORAL_CAPSULE | Freq: Every day | ORAL | Status: DC
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
## 2012-06-26 NOTE — ED Notes (Signed)
Pt ahaking.  Pt reports "feeling anxious and unable to sleep"
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
## 2012-06-26 NOTE — ED Notes (Addendum)
Pt wanded and belongings searched by security. Pt has two belongings bags.
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
## 2012-06-27 MED ORDER — CLONAZEPAM 0.5 MG PO TABS: 0.5000 mg | ORAL_TABLET | Freq: Every evening | ORAL | Status: DC | PRN

## 2012-06-27 NOTE — ED Notes (Signed)
MD at bedside to discuss disposition 

## 2012-06-27 NOTE — ED Provider Notes (Signed)
Taylor Knight is a 42 y.o. female who presented to the ER in reference from a nursing care facility due to increased anxiety. Patient was evaluated by psychiatry who felt that she would be okay for discharge and referral to her therapist and primary care physician, he felt that discontinuing Xanax and starting on Klonopin 0.5 mg each bedtime was more appropriate and continue the Prozac. I interviewed the patient myself, is no suicidal homicidal ideation, no delusions, patient has good insight, she is not hyperverbal or anxious at this time she has no symptoms consistent with major depressive disorder. We'll discharge the patient home stable and good condition, she has appropriate followup.       Jones Skene, MD 06/27/12 1191

## 2012-06-28 LAB — URINE CULTURE
Colony Count: NO GROWTH
Culture: NO GROWTH

## 2012-07-07 ENCOUNTER — Ambulatory Visit: Payer: 59 | Admitting: Family Medicine

## 2012-07-08 ENCOUNTER — Ambulatory Visit (INDEPENDENT_AMBULATORY_CARE_PROVIDER_SITE_OTHER): Payer: 59 | Admitting: Family Medicine

## 2012-07-08 VITALS — BP 124/79 | HR 76 | Temp 98.8°F | Resp 16 | Ht 66.0 in | Wt 153.0 lb

## 2012-07-08 DIAGNOSIS — F411 Generalized anxiety disorder: Secondary | ICD-10-CM

## 2012-07-08 DIAGNOSIS — R002 Palpitations: Secondary | ICD-10-CM

## 2012-07-08 DIAGNOSIS — F419 Anxiety disorder, unspecified: Secondary | ICD-10-CM

## 2012-07-08 DIAGNOSIS — F32A Depression, unspecified: Secondary | ICD-10-CM

## 2012-07-08 DIAGNOSIS — F329 Major depressive disorder, single episode, unspecified: Secondary | ICD-10-CM

## 2012-07-08 DIAGNOSIS — R079 Chest pain, unspecified: Secondary | ICD-10-CM

## 2012-07-08 NOTE — Patient Instructions (Addendum)
If needed - you can call [facility] at [phone]. This is a 24 hour number for assistance.  Call 911 if you feel you are in a dangerous situation, and agree with staying with a friend tonight.  Keep your follow up with your psychologist in 2 days and you should be receiving a call in the next week about a psychiatrist referral - call us if you have not heard by that time.  No change in medicines at this point.  Follow up in next 1 week if not seen by psychiatrist at that point. Return to the clinic or go to the nearest emergency room if any of your symptoms worsen or new symptoms occur.
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
## 2012-07-15 ENCOUNTER — Other Ambulatory Visit (HOSPITAL_COMMUNITY): Payer: Self-pay | Admitting: Cardiology

## 2012-07-15 DIAGNOSIS — R0602 Shortness of breath: Secondary | ICD-10-CM

## 2012-07-15 DIAGNOSIS — R079 Chest pain, unspecified: Secondary | ICD-10-CM

## 2012-07-25 ENCOUNTER — Other Ambulatory Visit (HOSPITAL_COMMUNITY): Payer: Self-pay | Admitting: Cardiology

## 2012-07-25 DIAGNOSIS — R0602 Shortness of breath: Secondary | ICD-10-CM

## 2012-07-25 DIAGNOSIS — R079 Chest pain, unspecified: Secondary | ICD-10-CM

## 2012-07-29 ENCOUNTER — Ambulatory Visit (HOSPITAL_COMMUNITY)
Admission: RE | Admit: 2012-07-29 | Discharge: 2012-07-29 | Disposition: A | Discharge status: Home / Self Care | Payer: 59 | Source: Ambulatory Visit | Attending: Cardiology | Admitting: Cardiology

## 2012-07-29 DIAGNOSIS — R0602 Shortness of breath: Secondary | ICD-10-CM | POA: Insufficient documentation

## 2012-07-29 DIAGNOSIS — R079 Chest pain, unspecified: Secondary | ICD-10-CM

## 2012-08-11 ENCOUNTER — Telehealth: Payer: Self-pay

## 2012-09-18 ENCOUNTER — Ambulatory Visit (INDEPENDENT_AMBULATORY_CARE_PROVIDER_SITE_OTHER): Payer: 59 | Admitting: Emergency Medicine

## 2012-09-18 ENCOUNTER — Ambulatory Visit: Payer: 59

## 2012-09-18 ENCOUNTER — Emergency Department (HOSPITAL_COMMUNITY): Payer: 59

## 2012-09-18 ENCOUNTER — Encounter (HOSPITAL_COMMUNITY): Payer: Self-pay | Admitting: Emergency Medicine

## 2012-09-18 ENCOUNTER — Emergency Department (HOSPITAL_COMMUNITY)
Admission: EM | Admit: 2012-09-18 | Discharge: 2012-09-18 | Disposition: A | Discharge status: Home / Self Care | Payer: 59 | Attending: Emergency Medicine | Admitting: Emergency Medicine

## 2012-09-18 VITALS — BP 149/83 | HR 91 | Temp 98.5°F | Resp 16 | Ht 66.0 in | Wt 154.0 lb

## 2012-09-18 DIAGNOSIS — F3289 Other specified depressive episodes: Secondary | ICD-10-CM | POA: Insufficient documentation

## 2012-09-18 DIAGNOSIS — R0602 Shortness of breath: Secondary | ICD-10-CM

## 2012-09-18 DIAGNOSIS — R079 Chest pain, unspecified: Secondary | ICD-10-CM

## 2012-09-18 DIAGNOSIS — R071 Chest pain on breathing: Secondary | ICD-10-CM | POA: Insufficient documentation

## 2012-09-18 DIAGNOSIS — F29 Unspecified psychosis not due to a substance or known physiological condition: Secondary | ICD-10-CM | POA: Insufficient documentation

## 2012-09-18 DIAGNOSIS — R9439 Abnormal result of other cardiovascular function study: Secondary | ICD-10-CM

## 2012-09-18 DIAGNOSIS — Z8669 Personal history of other diseases of the nervous system and sense organs: Secondary | ICD-10-CM | POA: Insufficient documentation

## 2012-09-18 DIAGNOSIS — Z79899 Other long term (current) drug therapy: Secondary | ICD-10-CM | POA: Insufficient documentation

## 2012-09-18 DIAGNOSIS — K219 Gastro-esophageal reflux disease without esophagitis: Secondary | ICD-10-CM | POA: Insufficient documentation

## 2012-09-18 DIAGNOSIS — R0789 Other chest pain: Secondary | ICD-10-CM

## 2012-09-18 DIAGNOSIS — M79609 Pain in unspecified limb: Secondary | ICD-10-CM | POA: Insufficient documentation

## 2012-09-18 DIAGNOSIS — F17200 Nicotine dependence, unspecified, uncomplicated: Secondary | ICD-10-CM | POA: Insufficient documentation

## 2012-09-18 DIAGNOSIS — F329 Major depressive disorder, single episode, unspecified: Secondary | ICD-10-CM | POA: Insufficient documentation

## 2012-09-18 DIAGNOSIS — F411 Generalized anxiety disorder: Secondary | ICD-10-CM | POA: Insufficient documentation

## 2012-09-18 DIAGNOSIS — Z3200 Encounter for pregnancy test, result unknown: Secondary | ICD-10-CM

## 2012-09-18 LAB — BASIC METABOLIC PANEL
BUN: 9 mg/dL (ref 6–23)
CO2: 23 mEq/L (ref 19–32)
Calcium: 9.4 mg/dL (ref 8.4–10.5)
Chloride: 104 mEq/L (ref 96–112)
Creatinine, Ser: 0.69 mg/dL (ref 0.50–1.10)
GFR calc Af Amer: >90 mL/min (ref >90)
GFR calc non Af Amer: >90 mL/min (ref >90)
Glucose, Bld: 81 mg/dL (ref 70–99)
Potassium: 4.1 mEq/L (ref 3.5–5.1)
Sodium: 140 mEq/L (ref 135–145)

## 2012-09-18 LAB — CBC
HCT: 38.4 % (ref 36.0–46.0)
Hemoglobin: 14.2 g/dL (ref 12.0–15.0)
MCH: 31.9 pg (ref 26.0–34.0)
MCHC: 37 g/dL — ABNORMAL HIGH (ref 30.0–36.0)
MCV: 86.3 fL (ref 78.0–100.0)
Platelets: 255 10*3/uL (ref 150–400)
RBC: 4.45 MIL/uL (ref 3.87–5.11)
RDW: 11.8 % (ref 11.5–15.5)
WBC: 11.8 10*3/uL — ABNORMAL HIGH (ref 4.0–10.5)

## 2012-09-18 LAB — TROPONIN I: Troponin I: <0.3 ng/mL (ref <0.30)

## 2012-09-18 LAB — D-DIMER, QUANTITATIVE: D-Dimer, Quant: <0.27 ug/mL-FEU (ref 0.00–0.48)

## 2012-09-18 MED ORDER — HYDROCODONE-ACETAMINOPHEN 5-325 MG PO TABS
1.0000 | ORAL_TABLET | Freq: Once | ORAL | Status: AC
  Administered 2012-09-18: 1 via ORAL
  Filled 2012-09-18: qty 1

## 2012-09-18 MED ORDER — HYDROCODONE-ACETAMINOPHEN 5-325 MG PO TABS: 1.0000 | ORAL_TABLET | ORAL | Status: DC | PRN

## 2012-09-18 NOTE — ED Notes (Signed)
Patient states was at work today and started having a dull, but sharp, L sided chest pain that did not radiate.  Patient states it hurts more when I breath in deep.  Patient denies any N/V or syncopal event.   Patient states that when EMS examined, they felt her L calf and it hurt bad on palpation.

## 2012-09-18 NOTE — ED Notes (Signed)
Patient transported to X-ray 

## 2012-09-18 NOTE — ED Notes (Signed)
Pt given discharge paperwork.  No additional questions by pt.  VSS. E-signature obtained.

## 2012-09-18 NOTE — ED Provider Notes (Signed)
History     CSN: [id number]  Arrival date & time 09/18/12  1431   First MD Initiated Contact with Patient 09/18/12 1433      Chief Complaint  Patient presents with  . Chest Pain  . Leg Pain    left calf pain    (Consider location/radiation/quality/duration/timing/severity/associated sxs/prior treatment) Patient is a 42 y.o. female presenting with chest pain and leg pain. The history is provided by the patient.  Chest Pain Pain location:  L chest Pain radiates to:  Upper back Pain radiates to the back: yes   Associated symptoms: no abdominal pain, no fever, no nausea, no palpitations and no shortness of breath   Associated symptoms comment:  Sudden onset left sided, sharp chest pain while at work today working at her desk. She feels increased pain with breathing. She went to an urgent care first and was sent here for evaluation of concern for PE. She reports having traveled to the [facility] earlier in the month, returning on the 8th of April. No symptoms until today. Leg Pain Associated symptoms: no fever     Past Medical History  Diagnosis Date  . GERD (gastroesophageal reflux disease)   . Mallory-Weiss syndrome   . Depression   . Anxiety   . Concussion   . New onset seizure     Past Surgical History  Procedure Laterality Date  . Bunionectomy  2009    Family History  Problem Relation Age of Onset  . Ulcers Father   . Ulcers Son     History  Substance Use Topics  . Smoking status: Current Some Day Smoker -- 0.25 packs/day for 10 years    Types: Cigarettes  . Smokeless tobacco: Never Used     Comment: she stopped smoking 2 weeks ago with this illness and she agrees to continue with no further smoking at this point.  . Alcohol Use: No    OB History   Grav Para Term Preterm Abortions TAB SAB Ect Mult Living                  Review of Systems  Constitutional: Negative for fever.  HENT: Negative for congestion.   Respiratory: Negative for shortness of  breath.        Pleuritic chest pain without SOB.  Cardiovascular: Positive for chest pain. Negative for palpitations and leg swelling.  Gastrointestinal: Negative for nausea and abdominal pain.  Genitourinary: Negative for dysuria.  Psychiatric/Behavioral: Positive for confusion.    Allergies  Penicillins  Home Medications   Current Outpatient Rx  Name  Route  Sig  Dispense  Refill  . acetaminophen (TYLENOL) 500 MG tablet   Oral   Take 1,000 mg by mouth every 6 (six) hours as needed. For headache         . aspirin 325 MG tablet   Oral   Take 325 mg by mouth every 6 (six) hours as needed. Headache         . clonazePAM (KLONOPIN) 0.5 MG tablet   Oral   Take 1 tablet (0.5 mg total) by mouth at bedtime and may repeat dose one time if needed.   31 tablet   0   . FLUoxetine (PROZAC) 40 MG capsule   Oral   Take 1 capsule (40 mg total) by mouth daily.   90 capsule   0   . mometasone (NASONEX) 50 MCG/ACT nasal spray   Nasal   Place 2 sprays into the nose daily as needed.  For allergies           BP 121/101  Pulse 70  Temp(Src) 98 F (36.7 C) (Oral)  Resp 16  Ht 5\' 6"  (1.676 m)  Wt 150 lb (68.04 kg)  BMI 24.22 kg/m2  SpO2 100%  LMP 08/23/2012  Physical Exam  Constitutional: She is oriented to person, place, and time. She appears well-developed and well-nourished.  HENT:  Head: Normocephalic.  Neck: Normal range of motion. Neck supple.  Cardiovascular: Normal rate and regular rhythm.   Pulmonary/Chest: Effort normal and breath sounds normal. She exhibits no tenderness.  Abdominal: Soft. Bowel sounds are normal. There is no tenderness. There is no rebound and no guarding.  Musculoskeletal: Normal range of motion. She exhibits no edema.  Mild palpable left calf tenderness to palpation without swelling of the extremity. No redness.   Neurological: She is alert and oriented to person, place, and time.  Skin: Skin is warm and dry. No rash noted.  Psychiatric: She  has a normal mood and affect.    ED Course  Procedures (including critical care time) Results for orders placed during the hospital encounter of 09/18/12  CBC      Result Value Range   WBC 11.8 (*) 4.0 - 10.5 K/uL   RBC 4.45  3.87 - 5.11 MIL/uL   Hemoglobin 14.2  12.0 - 15.0 g/dL   HCT 40.9  81.1 - 91.4 %   MCV 86.3  78.0 - 100.0 fL   MCH 31.9  26.0 - 34.0 pg   MCHC 37.0 (*) 30.0 - 36.0 g/dL   RDW 78.2  95.6 - 21.3 %   Platelets 255  150 - 400 K/uL  BASIC METABOLIC PANEL      Result Value Range   Sodium 140  135 - 145 mEq/L   Potassium 4.1  3.5 - 5.1 mEq/L   Chloride 104  96 - 112 mEq/L   CO2 23  19 - 32 mEq/L   Glucose, Bld 81  70 - 99 mg/dL   BUN 9  6 - 23 mg/dL   Creatinine, Ser 0.86  0.50 - 1.10 mg/dL   Calcium 9.4  8.4 - 57.8 mg/dL   GFR calc non Af Amer >90  >90 mL/min   GFR calc Af Amer >90  >90 mL/min  D-DIMER, QUANTITATIVE      Result Value Range   D-Dimer, Quant <0.27  0.00 - 0.48 ug/mL-FEU  TROPONIN I      Result Value Range   Troponin I <0.30  <0.30 ng/mL   Dg Chest 2 View  09/18/2012  *RADIOLOGY REPORT*  Clinical Data:  Left chest pain.  Shortness of breath.  CHEST - 2 VIEW  Comparison: PA and lateral chest 09/18/2012 and 07/03/2011.  Findings: Lungs clear.  Heart size normal.  No pneumothorax or pleural fluid.  Heart size normal.  No bony abnormality.  IMPRESSION: Normal chest.   Original Report Authenticated By: Holley Dexter, M.D.    Dg Chest 2 View  09/18/2012  *RADIOLOGY REPORT*  Clinical Data: History of chest pain.  Severe pain in the left side of the chest.  CHEST - 2 VIEW  Comparison: 07/03/2011.  Findings: Cardiac silhouette is normal size and shape.  It appears stable.  No mediastinal or hilar lesions are seen.  Mediastinal and hilar contours appear stable.  No pulmonary infiltrates or masses are evident. No pleural abnormality is evident. Bones appear average for age.  IMPRESSION:  No acute or active cardiopulmonary or pleural abnormalities  are  evident.   Original Report Authenticated By: Onalee Hua Call    Date: 09/18/2012  Rate: 78  Rhythm: normal sinus rhythm  QRS Axis: normal  Intervals: normal  ST/T Wave abnormalities: normal  Conduction Disutrbances:none  Narrative Interpretation:   Old EKG Reviewed: none available   Labs Reviewed - No data to display No results found.   No diagnosis found.  1. Chest wall pain   MDM  Labs, including d-dimer, within normal limits. Patient's pain is better with medications. VSS. Suspect, and exam/lab/x-ray supported by, chest wall pain.        Arnoldo Hooker, PA-C 09/18/12 1648

## 2012-09-18 NOTE — ED Provider Notes (Signed)
42 year old female comes in with sharp, pleuritic left-sided chest pain which radiates through to the back. She does have history of traveling in a plane across the country 9 days ago. On exam, lungs are clear heart has regular rate and rhythm. She has mild tenderness in the posterior aspect of the left calf without swelling. She is at increased risk for pulmonary embolism due to travel and will need to be screened with a d-dimer.  ECG shows normal sinus rhythm with a rate of 78, no ectopy. Normal axis. Normal P wave. Normal QRS. Normal intervals. Normal ST and T waves. Impression: normal ECG. Compared with ECG of 05/10/2012, no significant changes are seen.  Medical screening examination/treatment/procedure(s) were conducted as a shared visit with non-physician practitioner(s) and myself.  I personally evaluated the patient during the encounter   Dione Booze, MD 09/18/12 [phone]

## 2012-09-18 NOTE — Progress Notes (Signed)
  Subjective:    Patient ID: Taylor Knight, female    DOB: July 13, 1970, 42 y.o.   MRN: [id number]  HPI presents today with chest pain. She is having severe pain in the left side of her chest with pain on movement of the left arm. Her history is pertinent in that she did have a trip to [facility] and returned on the sixth. This was by air. She's had some mild discomfort in her left calf. She has increased pain when she lives her left arm or moves her left arm and the pain is localized over the left side of her chest.    Review of Systems since her last visit here she had separated from her partner. She lives in an apartment by herself. She is doing much better psychologically. She is no longer seeing a therapist and has stopped her benzodiazepines and  antidepressants. She has not been sexually active for at least 3-4 months.     Objective:   Physical Exam patient is alert and cooperative hyperventilating and somewhat in the room. The neck is supple. Chest is clear to auscultation and percussion. There is exquisite tenderness over the left pectoralis muscle. I did not feel mass in this area. Cardiac exam shows a regular rate without murmurs. The abdomen is soft without tenderness. Right calf is normal the left calf has mild tenderness distally. There is no appreciable swelling .  EKG normal  [facility] reading (PRIMARY) by  Dr. Cleta Alberts no acute disease      Assessment & Plan:    Due to her calf pain and recent trip to [facility] by plane along pleuritic versus musculoskeletal pain she was sent to the ER for evaluation. She did have some abnormalities noted on her stress test the stroke certainly cardiac enzymes would be indicated also.

## 2012-09-19 ENCOUNTER — Encounter: Payer: Self-pay | Admitting: Emergency Medicine

## 2012-09-22 ENCOUNTER — Other Ambulatory Visit (HOSPITAL_COMMUNITY): Payer: Self-pay | Admitting: Cardiology

## 2012-09-22 DIAGNOSIS — R079 Chest pain, unspecified: Secondary | ICD-10-CM

## 2012-09-25 ENCOUNTER — Encounter: Payer: Self-pay | Admitting: Family Medicine

## 2012-09-25 ENCOUNTER — Ambulatory Visit (HOSPITAL_COMMUNITY)
Admission: RE | Admit: 2012-09-25 | Discharge: 2012-09-25 | Disposition: A | Discharge status: Home / Self Care | Payer: 59 | Source: Ambulatory Visit | Attending: Cardiology | Admitting: Cardiology

## 2012-09-25 DIAGNOSIS — R079 Chest pain, unspecified: Secondary | ICD-10-CM | POA: Insufficient documentation

## 2012-09-25 HISTORY — PX: CARDIOVASCULAR STRESS TEST: SHX262

## 2012-09-25 MED ORDER — TECHNETIUM TC 99M SESTAMIBI GENERIC - CARDIOLITE
30.0000 | Freq: Once | INTRAVENOUS | Status: AC | PRN
  Administered 2012-09-25: 30 via INTRAVENOUS

## 2012-09-25 MED ORDER — TECHNETIUM TC 99M SESTAMIBI GENERIC - CARDIOLITE
10.0000 | Freq: Once | INTRAVENOUS | Status: AC | PRN
  Administered 2012-09-25: 10 via INTRAVENOUS

## 2012-09-25 NOTE — Procedures (Addendum)
[facility] [address] [place] [address] [place] [address] [phone]  Cardiology Nuclear Med Study  Taylor Knight is a 42 y.o. female     MRN : [id number]     DOB: 1970-12-26  Procedure Date: 09/25/2012  Nuclear Med Background Indication for Stress Test:  Evaluation for Ischemia and Post Hospital History:  NO PRIOR CARDIAC HISTORY Cardiac Risk Factors: Family History - CAD, Hypertension, Overweight, PVD and Smoker  Symptoms:  Chest Pain, DOE, Fatigue, Palpitations and SOB   Nuclear Pre-Procedure Caffeine/Decaff Intake:  7:00pm NPO After: 5:00am   IV Site: R Forearm  IV 0.9% NS with Angio Cath:  22g  Chest Size (in):  N/A IV Started by: Emmit Pomfret, RN  Height: 5\' 6"  (1.676 m)  Cup Size: D  BMI:  Body mass index is 25.03 kg/(m^2). Weight:  155 lb (70.308 kg)   Tech Comments:  N/A    Nuclear Med Study 1 or 2 day study: 1 day  Stress Test Type:  Stress  Order Authorizing Provider:  Bryan Lemma, MD   Resting Radionuclide: Technetium 53m Sestamibi  Resting Radionuclide Dose10.   Stress Radionuclide:  Technetium 56m Sestamibi  Stress Radionuclide Dose: 30.6 mCi           Stress Protocol Rest HR: 77 Stress HR: 169  Rest BP: 129/92 Stress BP: 168/88  Exercise Time (min): 11:00 METS: 13.4   Predicted Max HR: 181 bpm % Max HR: 93.37 bpm Rate Pressure Product: 21308  Dose of Adenosine (mg):  n/a Dose of Lexiscan: n/a mg  Dose of Atropine (mg): n/a Dose of Dobutamine: n/a mcg/kg/min (at max HR)  Stress Test Technologist: Esperanza Sheets, CCT Nuclear Technologist: Koren Shiver, CNMT   Rest Procedure:  Myocardial perfusion imaging was performed at rest 45 minutes following the intravenous administration of Technetium 16m Sestamibi. Stress Procedure:  The patient performed treadmill exercise using a Bruce  Protocol for 11:00 minutes. The patient stopped due to Leg Fatigue and SOB and denied any chest pain.  There were no  significant ST-T wave changes.  Technetium 36m Sestamibi was injected at peak exercise and myocardial perfusion imaging was performed after a brief delay.  Transient Ischemic Dilatation (Normal <1.22):  0.82 Lung/Heart Ratio (Normal <0.45):  0.39 QGS EDV:  72 ml QGS ESV:  27 ml LV Ejection Fraction: 63%  Signed by      Rest ECG: NSR - Normal EKG  Stress ECG: No significant change from baseline ECG  QPS Raw Data Images:  Normal; no motion artifact; normal heart/lung ratio. Stress Images:  Normal homogeneous uptake in all areas of the myocardium. Rest Images:  Normal homogeneous uptake in all areas of the myocardium. Subtraction (SDS):  No evidence of ischemia.  Impression Exercise Capacity:  Good exercise capacity. BP Response:  Normal blood pressure response. Clinical Symptoms:  No significant symptoms noted. ECG Impression:  No significant ST segment change suggestive of ischemia. Comparison with Prior Nuclear Study: No previous nuclear study performed  Overall Impression:  Normal stress nuclear study.  LV Wall Motion:  NL LV Function; NL Wall Motion   Runell Gess, MD  09/25/2012 1:11 PM

## 2012-12-24 ENCOUNTER — Encounter (HOSPITAL_COMMUNITY): Payer: Self-pay | Admitting: Emergency Medicine

## 2012-12-24 ENCOUNTER — Emergency Department (HOSPITAL_COMMUNITY)
Admission: EM | Admit: 2012-12-24 | Discharge: 2012-12-24 | Disposition: A | Discharge status: Home / Self Care | Payer: 59 | Attending: Emergency Medicine | Admitting: Emergency Medicine

## 2012-12-24 ENCOUNTER — Ambulatory Visit (INDEPENDENT_AMBULATORY_CARE_PROVIDER_SITE_OTHER): Payer: 59 | Admitting: Family Medicine

## 2012-12-24 VITALS — BP 122/86 | HR 82 | Temp 98.0°F | Resp 20 | Ht 68.5 in | Wt 141.0 lb

## 2012-12-24 DIAGNOSIS — Z880 Allergy status to penicillin: Secondary | ICD-10-CM | POA: Insufficient documentation

## 2012-12-24 DIAGNOSIS — F17200 Nicotine dependence, unspecified, uncomplicated: Secondary | ICD-10-CM | POA: Insufficient documentation

## 2012-12-24 DIAGNOSIS — Z8719 Personal history of other diseases of the digestive system: Secondary | ICD-10-CM | POA: Insufficient documentation

## 2012-12-24 DIAGNOSIS — R259 Unspecified abnormal involuntary movements: Secondary | ICD-10-CM

## 2012-12-24 DIAGNOSIS — Z8669 Personal history of other diseases of the nervous system and sense organs: Secondary | ICD-10-CM | POA: Insufficient documentation

## 2012-12-24 DIAGNOSIS — IMO0001 Reserved for inherently not codable concepts without codable children: Secondary | ICD-10-CM

## 2012-12-24 DIAGNOSIS — Z3202 Encounter for pregnancy test, result negative: Secondary | ICD-10-CM | POA: Insufficient documentation

## 2012-12-24 DIAGNOSIS — F410 Panic disorder [episodic paroxysmal anxiety] without agoraphobia: Secondary | ICD-10-CM | POA: Insufficient documentation

## 2012-12-24 DIAGNOSIS — F411 Generalized anxiety disorder: Secondary | ICD-10-CM

## 2012-12-24 DIAGNOSIS — Z87820 Personal history of traumatic brain injury: Secondary | ICD-10-CM | POA: Insufficient documentation

## 2012-12-24 LAB — ETHANOL: Alcohol, Ethyl (B): <11 mg/dL (ref 0–11)

## 2012-12-24 LAB — GLUCOSE, POCT (MANUAL RESULT ENTRY): POC Glucose: 72 mg/dl (ref 70–99)

## 2012-12-24 LAB — CBC WITH DIFFERENTIAL/PLATELET
Basophils Absolute: 0 10*3/uL (ref 0.0–0.1)
Basophils Relative: 0 % (ref 0–1)
Eosinophils Absolute: 0.1 10*3/uL (ref 0.0–0.7)
Eosinophils Relative: 2 % (ref 0–5)
HCT: 38.9 % (ref 36.0–46.0)
Hemoglobin: 13.7 g/dL (ref 12.0–15.0)
Lymphocytes Relative: 12 % (ref 12–46)
Lymphs Abs: 1.1 10*3/uL (ref 0.7–4.0)
MCH: 31.1 pg (ref 26.0–34.0)
MCHC: 35.2 g/dL (ref 30.0–36.0)
MCV: 88.2 fL (ref 78.0–100.0)
Monocytes Absolute: 0.6 10*3/uL (ref 0.1–1.0)
Monocytes Relative: 7 % (ref 3–12)
Neutro Abs: 7.1 10*3/uL (ref 1.7–7.7)
Neutrophils Relative %: 79 % — ABNORMAL HIGH (ref 43–77)
Platelets: 246 10*3/uL (ref 150–400)
RBC: 4.41 MIL/uL (ref 3.87–5.11)
RDW: 11.9 % (ref 11.5–15.5)
WBC: 8.9 10*3/uL (ref 4.0–10.5)

## 2012-12-24 LAB — BASIC METABOLIC PANEL
BUN: 10 mg/dL (ref 6–23)
CO2: 26 mEq/L (ref 19–32)
Calcium: 9.6 mg/dL (ref 8.4–10.5)
Chloride: 104 mEq/L (ref 96–112)
Creatinine, Ser: 0.71 mg/dL (ref 0.50–1.10)
GFR calc Af Amer: >90 mL/min (ref >90)
GFR calc non Af Amer: >90 mL/min (ref >90)
Glucose, Bld: 89 mg/dL (ref 70–99)
Potassium: 4.6 mEq/L (ref 3.5–5.1)
Sodium: 139 mEq/L (ref 135–145)

## 2012-12-24 LAB — POCT CBC
Granulocyte percent: 83.5 %G — AB (ref 37–80)
HCT, POC: 42.1 % (ref 37.7–47.9)
Hemoglobin: 13.9 g/dL (ref 12.2–16.2)
Lymph, poc: 1.2 (ref 0.6–3.4)
MCH, POC: 31.3 pg — AB (ref 27–31.2)
MCHC: 33 g/dL (ref 31.8–35.4)
MCV: 94.8 fL (ref 80–97)
MID (cbc): 0.5 (ref 0–0.9)
MPV: 8.6 fL (ref 0–99.8)
POC Granulocyte: 8.9 — AB (ref 2–6.9)
POC LYMPH PERCENT: 11.6 %L (ref 10–50)
POC MID %: 4.9 %M (ref 0–12)
Platelet Count, POC: 257 10*3/uL (ref 142–424)
RBC: 4.44 M/uL (ref 4.04–5.48)
RDW, POC: 13.4 %
WBC: 10.6 10*3/uL — AB (ref 4.6–10.2)

## 2012-12-24 LAB — RAPID URINE DRUG SCREEN, HOSP PERFORMED
Amphetamines: NOT DETECTED
Barbiturates: NOT DETECTED
Benzodiazepines: NOT DETECTED
Cocaine: NOT DETECTED
Opiates: POSITIVE — AB
Tetrahydrocannabinol: NOT DETECTED

## 2012-12-24 LAB — PREGNANCY, URINE: Preg Test, Ur: NEGATIVE

## 2012-12-24 MED ORDER — CLONAZEPAM 0.5 MG PO TABS: 0.5000 mg | ORAL_TABLET | Freq: Once | ORAL | Status: DC

## 2012-12-24 MED ORDER — ALPRAZOLAM 0.25 MG PO TABS: 0.2500 mg | ORAL_TABLET | Freq: Once | ORAL | Status: DC

## 2012-12-24 MED ORDER — CLONAZEPAM 0.5 MG PO TABS
0.5000 mg | ORAL_TABLET | Freq: Once | ORAL | Status: AC
  Administered 2012-12-24: 0.5 mg via ORAL
  Filled 2012-12-24: qty 1

## 2012-12-24 NOTE — Progress Notes (Signed)
[facility] [address], [place] [address] [phone]  Date:  12/24/2012   Name:  Taylor Knight   DOB:  03-19-1971   MRN:  [id number]  PCP:  Shade Flood, MD    Chief Complaint: Panic Attack   History of Present Illness:  Taylor Knight is a 42 y.o. very pleasant female patient who presents with the following:  She states she has a history of anxiety and panic attacks. She notes a possible acute panic attack this morning- She notes she has had cold sweats, vomited once, and cannot seem to stop shaking.  She has been shaking since about 0730 this am.  She had been on medication for anxiety and depression in the past, but has not taken these meds for several months.  She did take one prozac last night because she felt that she was "getting to the end of what i could take."   She did have ?seizures last fall and was in the hospital for a few days.  No cause for her seizures was found  She denies using alcohol or any other drugs.  Denies any visual or auditory hallucinations, no SI   She has not taken her xanax in some time.  She was on klonopin more recently, but is not taking this now either. She has not had anything to eat yet today.   LMP was the start of this month.   Patient Active Problem List   Diagnosis Date Noted  . Syncope 05/11/2012  . Altered mental state 05/11/2012  . Seizure 04/30/2012  . Depression with anxiety 03/24/2012  . Varicose veins of lower extremities with other complications 06/07/2011    Past Medical History  Diagnosis Date  . GERD (gastroesophageal reflux disease)   . Mallory-Weiss syndrome   . Depression   . Anxiety   . Concussion   . New onset seizure   . Allergy     Past Surgical History  Procedure Laterality Date  . Bunionectomy  2009    History  Substance Use Topics  . Smoking status: Current Some Day Smoker -- 0.25 packs/day for 10 years    Types: Cigarettes  . Smokeless tobacco: Never Used     Comment: she  stopped smoking 2 weeks ago with this illness and she agrees to continue with no further smoking at this point.  . Alcohol Use: No    Family History  Problem Relation Age of Onset  . Ulcers Father   . Ulcers Son     Allergies  Allergen Reactions  . Penicillins Hives    ITCHING    Medication list has been reviewed and updated.  Current Outpatient Prescriptions on File Prior to Visit  Medication Sig Dispense Refill  . HYDROcodone-acetaminophen (NORCO/VICODIN) 5-325 MG per tablet Take 1 tablet by mouth every 4 (four) hours as needed for pain.  8 tablet  0  . mometasone (NASONEX) 50 MCG/ACT nasal spray Place 2 sprays into the nose daily as needed. For allergies      . naproxen sodium (ANAPROX) 220 MG tablet Take 440 mg by mouth daily as needed (for pain).       No current facility-administered medications on file prior to visit.    Review of Systems:  As per HPI- otherwise negative. Tearful  Physical Examination: Filed Vitals:   12/24/12 1001  BP: 122/86  Pulse: 82  Temp: 98 F (36.7 C)  Resp: 20   Filed Vitals:   12/24/12 1001  Height:  5' 8.5" (1.74 m)  Weight: 141 lb (63.957 kg)   Body mass index is 21.12 kg/(m^2). Ideal Body Weight: Weight in (lb) to have BMI = 25: 166.5  GEN: WDWN, non- toxic, alert, anxious and shaking.   HEENT: Atraumatic, Normocephalic. Neck supple. No masses, No LAD.  Bilateral TM wnl, oropharynx normal.  PEERL,EOMI.   Ears and Nose: No external deformity. CV: RRR, No M/G/R. No JVD. No thrill. No extra heart sounds. PULM: CTA B, no wheezes, crackles, rhonchi. No retractions. No resp. distress. No accessory muscle use. EXTR: No c/c/e NEURO Normal gait.  PSYCH: Normally interactive, able to converse and answer questions but shaking. Normal patellar and biceps DTR Visibly shaking in both arms and less so in her legs.  Unable to stop shaking even when I gently hold her arms down so I can listen to her heart.     Results for orders placed in  visit on 12/24/12  POCT CBC      Result Value Range   WBC 10.6 (*) 4.6 - 10.2 K/uL   Lymph, poc 1.2  0.6 - 3.4   POC LYMPH PERCENT 11.6  10 - 50 %L   MID (cbc) 0.5  0 - 0.9   POC MID % 4.9  0 - 12 %M   POC Granulocyte 8.9 (*) 2 - 6.9   Granulocyte percent 83.5 (*) 37 - 80 %G   RBC 4.44  4.04 - 5.48 M/uL   Hemoglobin 13.9  12.2 - 16.2 g/dL   HCT, POC 16.1  09.6 - 47.9 %   MCV 94.8  80 - 97 fL   MCH, POC 31.3 (*) 27 - 31.2 pg   MCHC 33.0  31.8 - 35.4 g/dL   RDW, POC 04.5     Platelet Count, POC 257  142 - 424 K/uL   MPV 8.6  0 - 99.8 fL  GLUCOSE, POCT (MANUAL RESULT ENTRY)      Result Value Range   POC Glucose 72  70 - 99 mg/dl   Given 4.09 xanax po at 11am.  Given juice to drink Assessment and Plan: Shaking spells - Plan: POCT CBC, POCT glucose (manual entry)  Anxiety state, unspecified - Plan: ALPRAZolam (XANAX) tablet 0.25 mg  Will send to hospital for further evaluation and possible further psychiatric treatment.  EMS to transport   Signed Abbe Amsterdam, MD

## 2012-12-24 NOTE — ED Notes (Addendum)
Per EMS patient reports [facility] for anxiety and shaking, facility referred her to the ED for evaluation. Per EMS patient is A&O x 4, gets SOB when talking, and has been attempting to re-establish a relationship with her husband and father of her children with whom she has separated which is causing anxiety. On assessment, patient reports that she has been treated for anxiety in the past, that she reacted poorly to the xanax that she was prescribed and fell, hit her head, and later on had seizures believed to be secondary to head injury. Patient reports that she had not had any anxiety troubles over the past year so she weaned herself off of her other medication 7 months ago. Patient reports that anxiety has been building up over the past weeks-months due to disagreements with her "soon to be ex" husband wherein the husband makes her feel as though she cannot do anything right.

## 2012-12-24 NOTE — ED Provider Notes (Signed)
History    CSN: [id number] Arrival date & time 12/24/12  1116  First MD Initiated Contact with Patient 12/24/12 1211     Chief Complaint  Patient presents with  . Anxiety   (Consider location/radiation/quality/duration/timing/severity/associated sxs/prior Treatment) Patient is a 42 y.o. female presenting with anxiety. The history is provided by the patient. No language interpreter was used.  Anxiety Pertinent negatives include no chills or fever. Associated symptoms comments: Patient with history of panic/anxiety here with tremors, high anxiety, nausea with vomiting like what she considers a severe panic attack. She has been on Prozac and Klonopin but has been controlled without medication for 7 months. She reports new stressors - marital difficulty - and routine contact with therapist. No SI/HI, no drug or alcohol abuse.Marland Kitchen   Past Medical History  Diagnosis Date  . GERD (gastroesophageal reflux disease)   . Mallory-Weiss syndrome   . Depression   . Anxiety   . Concussion   . New onset seizure   . Allergy    Past Surgical History  Procedure Laterality Date  . Bunionectomy  2009   Family History  Problem Relation Age of Onset  . Ulcers Father   . Ulcers Son    History  Substance Use Topics  . Smoking status: Current Some Day Smoker -- 0.25 packs/day for 10 years    Types: Cigarettes  . Smokeless tobacco: Never Used     Comment: she stopped smoking 2 weeks ago with this illness and she agrees to continue with no further smoking at this point.  . Alcohol Use: No   OB History   Grav Para Term Preterm Abortions TAB SAB Ect Mult Living                 Review of Systems  Constitutional: Negative for fever and chills.  HENT: Negative.   Respiratory: Negative.   Cardiovascular: Negative.   Gastrointestinal: Negative.   Musculoskeletal: Negative.   Skin: Negative.   Neurological: Negative.   Psychiatric/Behavioral: Positive for agitation. The patient is nervous/anxious.      Allergies  Penicillins  Home Medications   Current Outpatient Rx  Name  Route  Sig  Dispense  Refill  . ibuprofen (ADVIL,MOTRIN) 200 MG tablet   Oral   Take 200 mg by mouth every 6 (six) hours as needed for pain.          BP 102/76  Pulse 75  Temp(Src) 97.5 F (36.4 C)  Resp 19  SpO2 97%  LMP 12/02/2012 Physical Exam  Constitutional: She is oriented to person, place, and time. She appears well-developed and well-nourished.  HENT:  Head: Normocephalic.  Neck: Normal range of motion. Neck supple.  Cardiovascular: Normal rate and regular rhythm.   No murmur heard. Pulmonary/Chest: Effort normal and breath sounds normal. She has no wheezes. She has no rales.  Abdominal: Soft. Bowel sounds are normal. There is no tenderness. There is no rebound and no guarding.  Musculoskeletal: Normal range of motion.  Neurological: She is alert and oriented to person, place, and time.  Ambulatory without gail imbalance.  Skin: Skin is warm and dry. No rash noted.  Psychiatric: She has a normal mood and affect.    ED Course  Procedures (including critical care time) Labs Reviewed - No data to display No results found. No diagnosis found. 1. Panic attack MDM  Feeling much better after Klonopin give. Lab studies normal. No SI/HI. Stable for discharge to outpatient therapist.   Arnoldo Hooker, PA-C 12/24/12  1502 

## 2012-12-24 NOTE — ED Provider Notes (Signed)
Medical screening examination/treatment/procedure(s) were performed by non-physician practitioner and as supervising physician I was immediately available for consultation/collaboration.  Flint Melter, MD 12/24/12 [phone]

## 2012-12-24 NOTE — ED Notes (Signed)
[id number]<AV> Expected date:<BR> Expected time:<BR> Means of arrival:<BR> Comments:<BR> EMS-anxiety

## 2012-12-25 ENCOUNTER — Telehealth: Payer: Self-pay

## 2012-12-25 DIAGNOSIS — F411 Generalized anxiety disorder: Secondary | ICD-10-CM

## 2012-12-25 NOTE — Telephone Encounter (Signed)
Spoke with patient and she would like to have a referral to psychiatry.  Patient was in the hospital this week and they recommended her call Dr. Neva Seat for a referral.  Therapist that she is seeing cannot prescribe the meds she needs.

## 2012-12-25 NOTE — Telephone Encounter (Signed)
Dr. Neva Seat patient is requesting a referral to psychiatric, she was in the hospital yesterday.  It is okay to call her at work  [phone]

## 2012-12-29 NOTE — Telephone Encounter (Signed)
I can refer her to psychiatry for eval and med management,  But I had referred her in January to psychiatry - did she see a psychiatrist then? Let me know and I can place another referral if needed, but can follow up with me in the interim for med management until she can be seen by psychiatry.

## 2012-12-29 NOTE — Telephone Encounter (Signed)
Referral made. Work wanted to take message did not leave message.

## 2013-01-30 ENCOUNTER — Encounter: Payer: Self-pay | Admitting: Family Medicine

## 2013-02-03 IMAGING — CT CT HEAD W/O CM
3 of 4 series · 14 of 30 positions shown, 17 images · non-contrast
Comparison: None

CT HEAD

CLINICAL DATA: Injury with head and neck pain

CT HEAD WITHOUT CONTRAST
CT CERVICAL SPINE WITHOUT CONTRAST
TECHNIQUE: Multidetector CT imaging of the head and cervical spine
was performed following the standard protocol without intravenous
contrast.  Multiplanar CT image reconstructions of the cervical
spine were also generated.

[Series 3: head w/o · axial · non-contrast · 0.43mm/px · z∈[+1417,+1487]mm · 3 of 30 slices shown]
[im 8/30  brain]
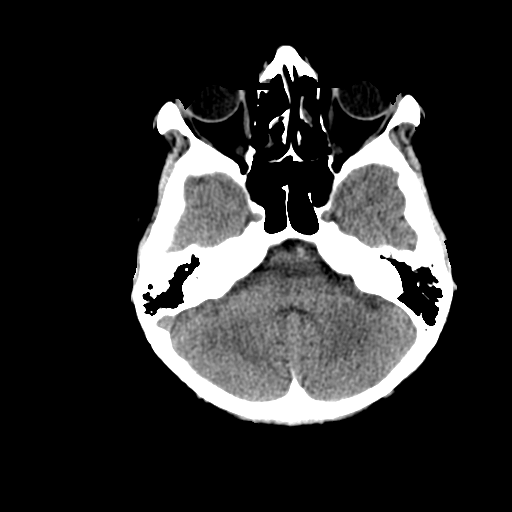
[im 15/30  brain]
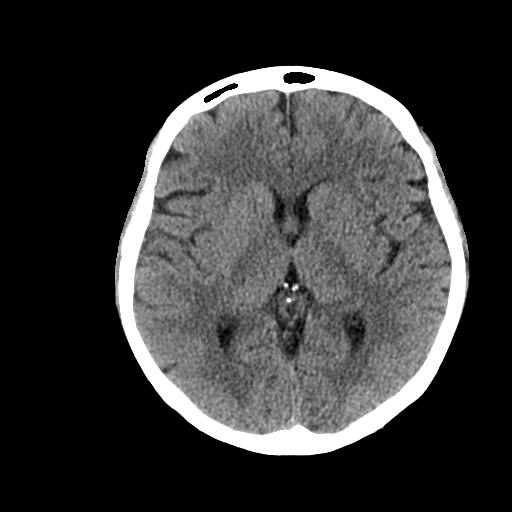
[im 22/30  brain]
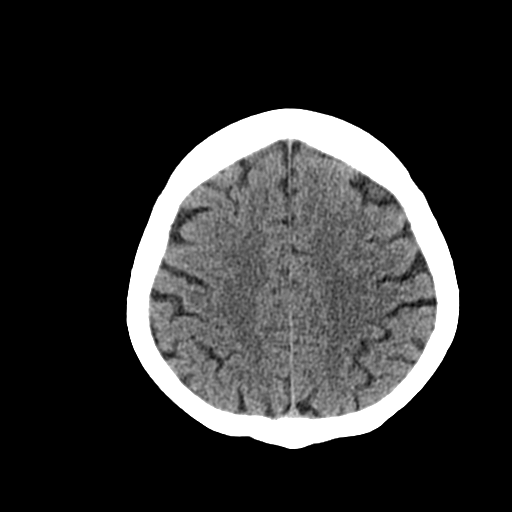

[Series 4: bone windows · axial · 0.43mm/px · z∈[+1427,+1477]mm · 2 of 30 slices shown]
[im 10/30  bone]
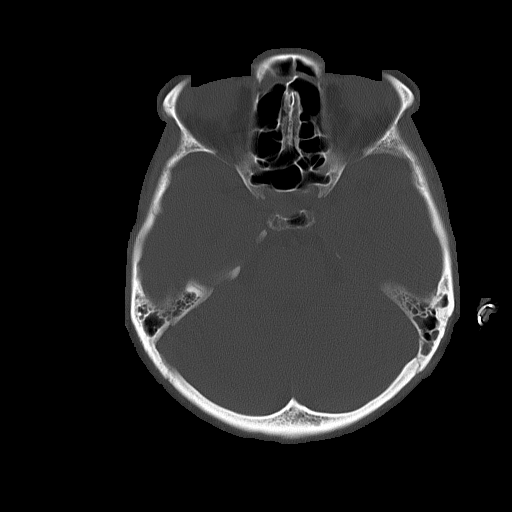
[im 20/30  bone]
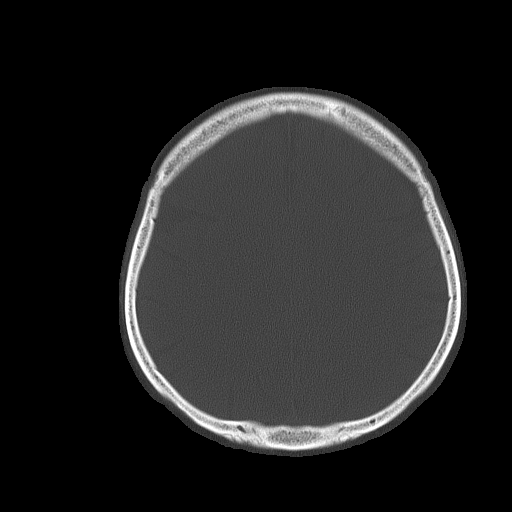

[Series 8: axial recon · axial · 0.23mm/px · z∈[+1237,+1354]mm · 9 of 75 slices shown, 12 images]
[im 8/75  brain]
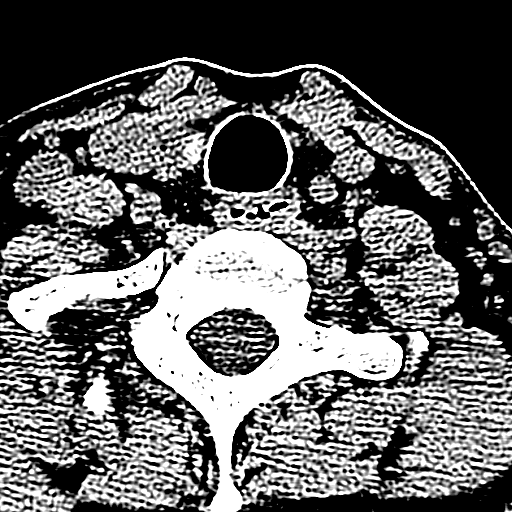
[im 8/75  bone]
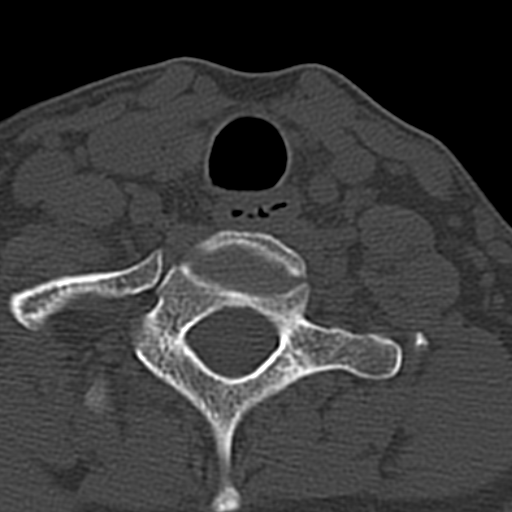
[im 15/75  brain]
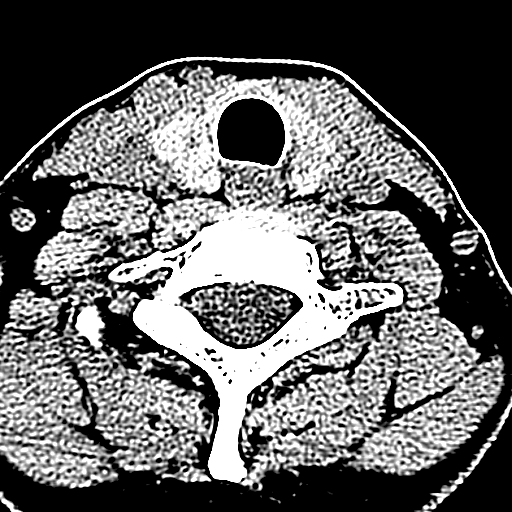
[im 23/75  brain]
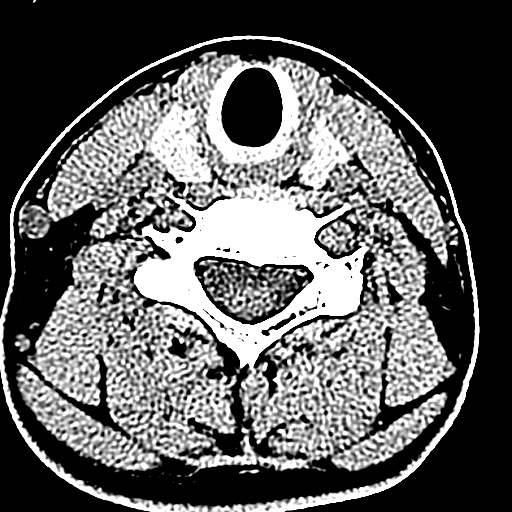
[im 30/75  brain]
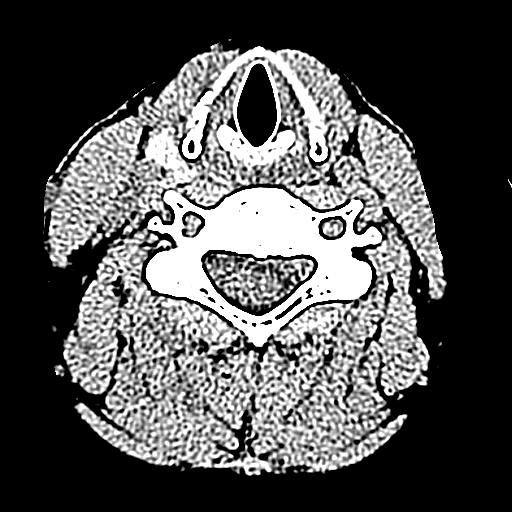
[im 38/75  brain]
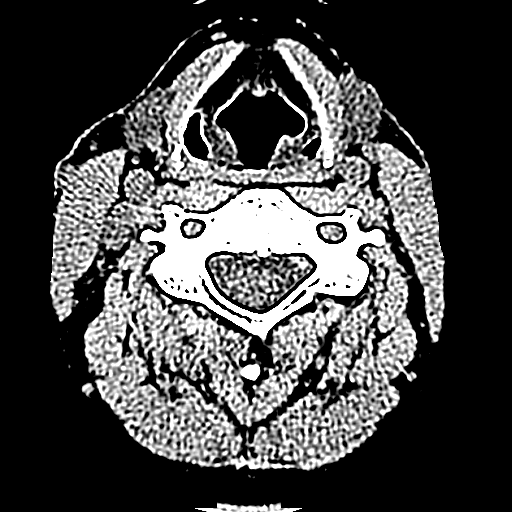
[im 38/75  bone]
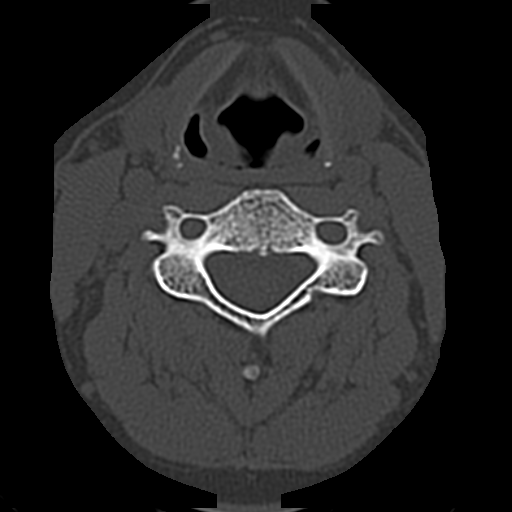
[im 45/75  brain]
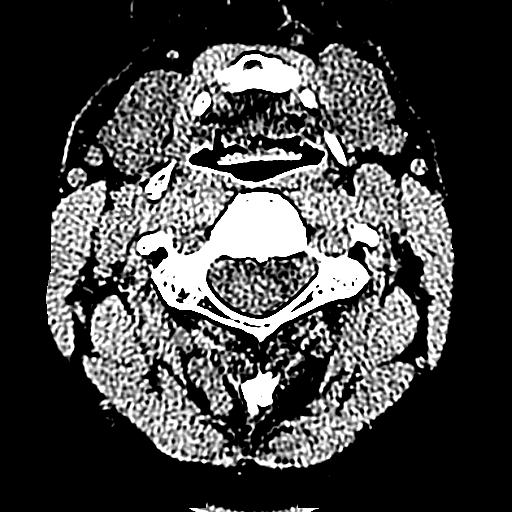
[im 52/75  brain]
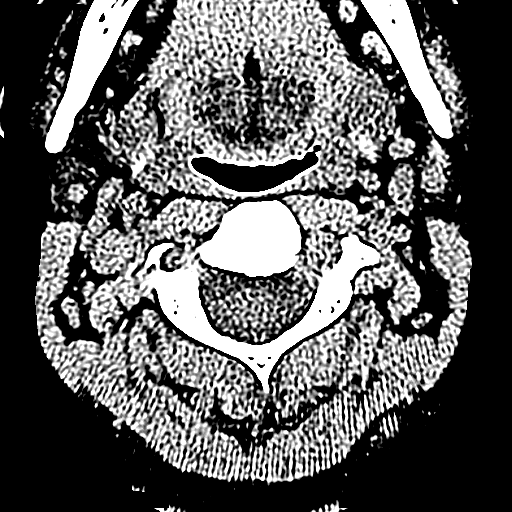
[im 60/75  brain]
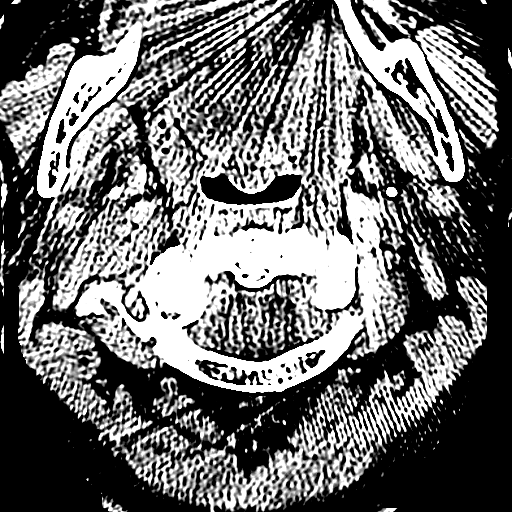
[im 67/75  brain]
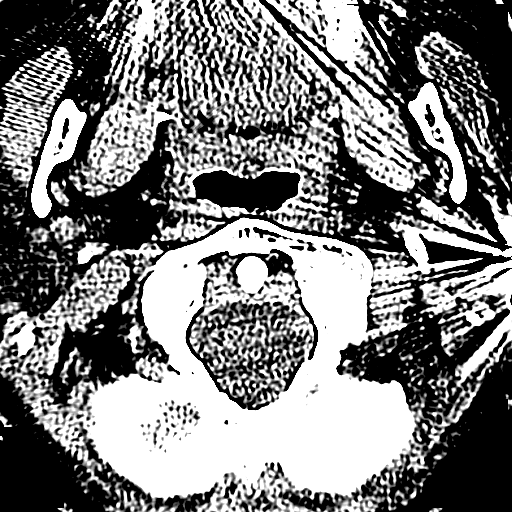
[im 67/75  bone]
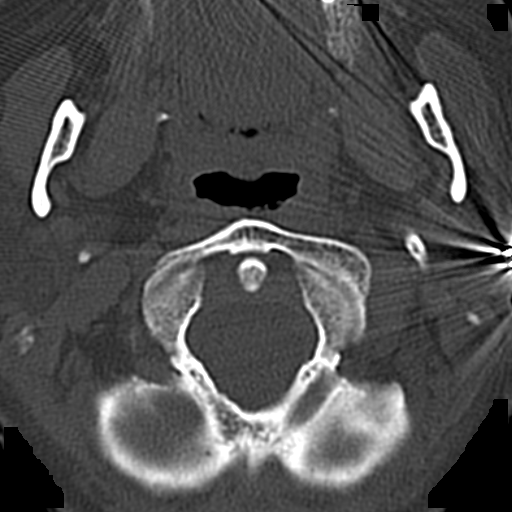

[14 of 30 positions shown; findings below may reference images not displayed]

FINDINGS: Injury over the right parietal bone.  No underlying
fracture.

The ventricles are normal in size and configuration.  There are no
parenchymal masses mass effect, no evidence of a recent infarct, no
extra-axial masses or abnormal fluid collections and no
intracranial hemorrhage.

The visualized sinuses and mastoid air cells are essentially clear.
IMPRESSION: Right parietal scalp injury. No fracture or intracranial
abnormality.

CT CERVICAL SPINE
FINDINGS: There is no fracture or spondylolisthesis.  No
degenerative changes are evident.  The soft tissues are
unremarkable.  The lung apices are clear.
IMPRESSION: Normal cervical spine CT.

## 2013-03-18 ENCOUNTER — Ambulatory Visit (INDEPENDENT_AMBULATORY_CARE_PROVIDER_SITE_OTHER): Payer: 59 | Admitting: Emergency Medicine

## 2013-03-18 ENCOUNTER — Ambulatory Visit: Payer: 59

## 2013-03-18 VITALS — BP 110/70 | HR 72 | Temp 98.1°F | Resp 16 | Ht 66.0 in | Wt 160.0 lb

## 2013-03-18 DIAGNOSIS — S97109A Crushing injury of unspecified toe(s), initial encounter: Secondary | ICD-10-CM

## 2013-03-18 DIAGNOSIS — M25572 Pain in left ankle and joints of left foot: Secondary | ICD-10-CM

## 2013-03-18 DIAGNOSIS — S97102A Crushing injury of unspecified left toe(s), initial encounter: Secondary | ICD-10-CM

## 2013-03-18 DIAGNOSIS — M25579 Pain in unspecified ankle and joints of unspecified foot: Secondary | ICD-10-CM

## 2013-03-18 MED ORDER — HYDROCODONE-ACETAMINOPHEN 5-325 MG PO TABS: 1.0000 | ORAL_TABLET | ORAL | Status: DC | PRN

## 2013-03-18 NOTE — Progress Notes (Signed)
[facility] [address] [place] [address] [phone]  Date:  03/18/2013   Name:  Taylor Knight   DOB:  1970/12/12   MRN:  [id number]  PCP:  Shade Flood, MD    Chief Complaint: Foot Injury   History of Present Illness:  Taylor Knight is a 42 y.o. very pleasant female patient who presents with the following:  Husband stepped on her left foot and injured her great toe.  Has bruise and pain.  Occurred Monday night.  Unable to stand without significant pain.  No improvement with over the counter medications or other home remedies. Denies other complaint or health concern today.   Patient Active Problem List   Diagnosis Date Noted  . Syncope 05/11/2012  . Altered mental state 05/11/2012  . Seizure 04/30/2012  . Depression with anxiety 03/24/2012  . Varicose veins of lower extremities with other complications 06/07/2011    Past Medical History  Diagnosis Date  . GERD (gastroesophageal reflux disease)   . Mallory-Weiss syndrome   . Depression   . Anxiety   . Concussion   . New onset seizure   . Allergy     Past Surgical History  Procedure Laterality Date  . Bunionectomy  2009    History  Substance Use Topics  . Smoking status: Current Some Day Smoker -- 0.25 packs/day for 10 years    Types: Cigarettes  . Smokeless tobacco: Never Used     Comment: she stopped smoking 2 weeks ago with this illness and she agrees to continue with no further smoking at this point.  . Alcohol Use: No    Family History  Problem Relation Age of Onset  . Ulcers Father   . Ulcers Son     Allergies  Allergen Reactions  . Penicillins Hives    ITCHING    Medication list has been reviewed and updated.  Current Outpatient Prescriptions on File Prior to Visit  Medication Sig Dispense Refill  . ibuprofen (ADVIL,MOTRIN) 200 MG tablet Take 200 mg by mouth every 6 (six) hours as needed for pain.      . clonazePAM (KLONOPIN) 0.5 MG tablet Take 1 tablet (0.5 mg  total) by mouth once.  10 tablet  0   Current Facility-Administered Medications on File Prior to Visit  Medication Dose Route Frequency Provider Last Rate Last Dose  . ALPRAZolam (XANAX) tablet 0.25 mg  0.25 mg Oral Once Pearline Cables, MD        Review of Systems:  As per HPI, otherwise negative.    Physical Examination: Filed Vitals:   03/18/13 1552  BP: 110/70  Pulse: 72  Temp: 98.1 F (36.7 C)  Resp: 16   Filed Vitals:   03/18/13 1552  Height: 5\' 6"  (1.676 m)  Weight: 160 lb (72.576 kg)   Body mass index is 25.84 kg/(m^2). Ideal Body Weight: Weight in (lb) to have BMI = 25: 154.6   GEN: WDWN, NAD, Non-toxic, Alert & Oriented x 3 HEENT: Atraumatic, Normocephalic.  Ears and Nose: No external deformity. EXTR: No clubbing/cyanosis/edema NEURO: Normal gait.  PSYCH: Normally interactive. Conversant. Not depressed or anxious appearing.  Calm demeanor.  LEFT foot:  Ecchymotic and swollen no deformity. Quite tender  Assessment and Plan: Crush injury left great toe vicodin Elevate Ice  Signed,  Phillips Odor, MD   [facility] reading (PRIMARY) by  Dr. Dareen Piano. No fracture.

## 2013-03-18 NOTE — Patient Instructions (Signed)
Crush Injury, Fingers or Toes  A crush injury to the fingers or toes means the tissues have been damaged by being squeezed (compressed). There will be bleeding into the tissues and swelling. Often, blood will collect under the skin. When this happens, the skin on the finger often dies and may slough off (shed) 1 week to 10 days later. Usually, new skin is growing underneath. If the injury has been too severe and the tissue does not survive, the damaged tissue may begin to turn black over several days.   Wounds which occur because of the crushing may be stitched (sutured) shut. However, crush injuries are more likely to become infected than other injuries.These wounds may not be closed as tightly as other types of cuts to prevent infection. Nails involved are often lost. These usually grow back over several weeks.   DIAGNOSIS  X-rays may be taken to see if there is any injury to the bones.  TREATMENT  Broken bones (fractures) may be treated with splinting, depending on the fracture. Often, no treatment is required for fractures of the last bone in the fingers or toes.  HOME CARE INSTRUCTIONS    The crushed part should be raised (elevated) above the heart or center of the chest as much as possible for the first several days or as directed. This helps with pain and lessens swelling. Less swelling increases the chances that the crushed part will survive.   Put ice on the injured area.   Put ice in a plastic bag.   Place a towel between your skin and the bag.   Leave the ice on for 15-20 minutes, 3-4 times a day for the first 2 days.   Only take over-the-counter or prescription medicines for pain, discomfort, or fever as directed by your caregiver.   Use your injured part only as directed.   Change your bandages (dressings) as directed.   Keep all follow-up appointments as directed by your caregiver. Not keeping your appointment could result in a chronic or permanent injury, pain, and disability. If there is  any problem keeping the appointment, you must call to reschedule.  SEEK IMMEDIATE MEDICAL CARE IF:    There is redness, swelling, or increasing pain in the wound area.   Pus is coming from the wound.   You have a fever.   You notice a bad smell coming from the wound or dressing.   The edges of the wound do not stay together after the sutures have been removed.   You are unable to move the injured finger or toe.  MAKE SURE YOU:    Understand these instructions.   Will watch your condition.   Will get help right away if you are not doing well or get worse.  Document Released: 05/21/2005 Document Revised: 08/13/2011 Document Reviewed: 10/06/2010  [employer] Patient Information 2014 [employer].

## 2013-04-09 ENCOUNTER — Other Ambulatory Visit: Payer: Self-pay

## 2013-04-28 NOTE — Telephone Encounter (Signed)
eror

## 2013-10-05 ENCOUNTER — Ambulatory Visit: Payer: 59 | Admitting: Family Medicine

## 2014-04-17 ENCOUNTER — Encounter (HOSPITAL_COMMUNITY): Payer: Self-pay | Admitting: Emergency Medicine

## 2014-04-17 ENCOUNTER — Emergency Department (HOSPITAL_COMMUNITY)
Admission: EM | Admit: 2014-04-17 | Discharge: 2014-04-17 | Disposition: A | Discharge status: Home / Self Care | Payer: 59 | Attending: Emergency Medicine | Admitting: Emergency Medicine

## 2014-04-17 ENCOUNTER — Emergency Department (HOSPITAL_COMMUNITY): Payer: 59

## 2014-04-17 DIAGNOSIS — R109 Unspecified abdominal pain: Secondary | ICD-10-CM

## 2014-04-17 DIAGNOSIS — R110 Nausea: Secondary | ICD-10-CM | POA: Insufficient documentation

## 2014-04-17 DIAGNOSIS — G40909 Epilepsy, unspecified, not intractable, without status epilepticus: Secondary | ICD-10-CM | POA: Diagnosis not present

## 2014-04-17 DIAGNOSIS — Z3202 Encounter for pregnancy test, result negative: Secondary | ICD-10-CM | POA: Diagnosis not present

## 2014-04-17 DIAGNOSIS — Z8719 Personal history of other diseases of the digestive system: Secondary | ICD-10-CM | POA: Diagnosis not present

## 2014-04-17 DIAGNOSIS — Z880 Allergy status to penicillin: Secondary | ICD-10-CM | POA: Diagnosis not present

## 2014-04-17 DIAGNOSIS — R1084 Generalized abdominal pain: Secondary | ICD-10-CM | POA: Diagnosis not present

## 2014-04-17 DIAGNOSIS — M545 Low back pain: Secondary | ICD-10-CM | POA: Insufficient documentation

## 2014-04-17 DIAGNOSIS — Z720 Tobacco use: Secondary | ICD-10-CM | POA: Insufficient documentation

## 2014-04-17 DIAGNOSIS — F419 Anxiety disorder, unspecified: Secondary | ICD-10-CM | POA: Insufficient documentation

## 2014-04-17 DIAGNOSIS — R6883 Chills (without fever): Secondary | ICD-10-CM | POA: Diagnosis not present

## 2014-04-17 DIAGNOSIS — Z87828 Personal history of other (healed) physical injury and trauma: Secondary | ICD-10-CM | POA: Insufficient documentation

## 2014-04-17 LAB — URINALYSIS, ROUTINE W REFLEX MICROSCOPIC
Bilirubin Urine: NEGATIVE
Glucose, UA: NEGATIVE mg/dL
Ketones, ur: NEGATIVE mg/dL
Nitrite: NEGATIVE
Protein, ur: NEGATIVE mg/dL
Specific Gravity, Urine: 1.01 (ref 1.005–1.030)
Urobilinogen, UA: 0.2 mg/dL (ref 0.0–1.0)
pH: 7 (ref 5.0–8.0)

## 2014-04-17 LAB — PREGNANCY, URINE: Preg Test, Ur: NEGATIVE

## 2014-04-17 LAB — URINE MICROSCOPIC-ADD ON

## 2014-04-17 MED ORDER — CYCLOBENZAPRINE HCL 10 MG PO TABS: 10.0000 mg | ORAL_TABLET | Freq: Two times a day (BID) | ORAL | Status: DC | PRN

## 2014-04-17 MED ORDER — HYDROCODONE-ACETAMINOPHEN 5-325 MG PO TABS: 1.0000 | ORAL_TABLET | Freq: Four times a day (QID) | ORAL | Status: DC | PRN

## 2014-04-17 MED ORDER — NAPROXEN 500 MG PO TABS: 500.0000 mg | ORAL_TABLET | Freq: Two times a day (BID) | ORAL | Status: DC

## 2014-04-17 MED ORDER — HYDROCODONE-ACETAMINOPHEN 5-325 MG PO TABS
1.0000 | ORAL_TABLET | Freq: Once | ORAL | Status: AC
  Administered 2014-04-17: 1 via ORAL
  Filled 2014-04-17: qty 1

## 2014-04-17 NOTE — ED Provider Notes (Signed)
Medical screening examination/treatment/procedure(s) were conducted as a shared visit with non-physician practitioner(s) and myself.  I personally evaluated the patient during the encounter.   EKG Interpretation None      43 year old female presenting with left flank pain. Has been colicky for the past 2 weeks. Last night it was very severe. Ibuprofen helped somewhat, but pain returned.  On exam, well appearing, nontoxic, not distressed, normal respiratory effort, normal perfusion, tenderness to palpation of left flank and left low back, mild tenderness of left lower quadrant of her abdomen without rigidity, rebound or guarding.  plan CT to rule out nephrolithiasis. Otherwise, suspect pain is most likely musculoskeletal in nature.  Clinical Impression: 1. Flank pain       Artis Delay, MD 04/17/14 [phone]

## 2014-04-17 NOTE — ED Notes (Signed)
Pt still unable to urinate. Drinking water.Denies nausea

## 2014-04-17 NOTE — Discharge Instructions (Signed)
Flank Pain °Flank pain refers to pain that is located on the side of the body between the upper abdomen and the back. The pain may occur over a short period of time (acute) or may be long-term or reoccurring (chronic). It may be mild or severe. Flank pain can be caused by many things. °CAUSES  °Some of the more common causes of flank pain include: °· Muscle strains.   °· Muscle spasms.   °· A disease of your spine (vertebral disk disease).   °· A lung infection (pneumonia).   °· Fluid around your lungs (pulmonary edema).   °· A kidney infection.   °· Kidney stones.   °· A very painful skin rash caused by the chickenpox virus (shingles).   °· Gallbladder disease.   °HOME CARE INSTRUCTIONS  °Home care will depend on the cause of your pain. In general, °· Rest as directed by your caregiver. °· Drink enough fluids to keep your urine clear or pale yellow. °· Only take over-the-counter or prescription medicines as directed by your caregiver. Some medicines may help relieve the pain. °· Tell your caregiver about any changes in your pain. °· Follow up with your caregiver as directed. °SEEK IMMEDIATE MEDICAL CARE IF:  °· Your pain is not controlled with medicine.   °· You have new or worsening symptoms. °· Your pain increases.   °· You have abdominal pain.   °· You have shortness of breath.   °· You have persistent nausea or vomiting.   °· You have swelling in your abdomen.   °· You feel faint or pass out.   °· You have blood in your urine. °· You have a fever or persistent symptoms for more than 2-3 days. °· You have a fever and your symptoms suddenly get worse. °MAKE SURE YOU:  °· Understand these instructions. °· Will watch your condition. °· Will get help right away if you are not doing well or get worse. °Document Released: 07/12/2005 Document Revised: 02/13/2012 Document Reviewed: 01/03/2012 °[employer]® Patient Information ©2015 [employer]. This information is not intended to replace advice given to you by your  health care provider. Make sure you discuss any questions you have with your health care provider. ° °

## 2014-04-17 NOTE — ED Provider Notes (Signed)
CSN: [id number]     Arrival date & time 04/17/14  1409 History  This chart was scribed for Taylor Skeans, PA-C, working with Taylor Jacobsen, MD found by Taylor Knight, ED Scribe. This patient was seen in room [place] and the patient's care was started at 3:40 PM.  Chief Complaint  Patient presents with  . Back Pain  . Flank Pain    l/flank pain x 2 weeks   The history is provided by the patient. No language interpreter was used.   HPI Comments: Taylor Knight is a 43 y.o. female who presents to the Emergency Department complaining of intermittent 7-8/10 left flank pain with some radiation to her groin.  She reports the pain has been worse at night and worse over the past two days.  She reports the pain is aggravated by movement, walking.   Patient reports associated chills and nausea.  Patient reports previous episodes of similar pain, however, these episodes involved alternation between left and right-sided flank pain.  Patient denies history of kidney stones.  Patient denies recent heavy-lifting, changes in workout routine  Patient denies dysuria, changes in frequency, urgency, hematuria, bowel/bladder incontinence, tingling/numnbness, vomiting.   PCP: Dr. Carlota Raspberry Past Medical History  Diagnosis Date  . GERD (gastroesophageal reflux disease)   . Mallory-Weiss syndrome   . Depression   . Anxiety   . Concussion   . New onset seizure   . Allergy    Past Surgical History  Procedure Laterality Date  . Bunionectomy  2009   Family History  Problem Relation Age of Onset  . Ulcers Father   . Ulcers Son    History  Substance Use Topics  . Smoking status: Current Some Day Smoker -- 0.25 packs/day for 10 years    Types: Cigarettes  . Smokeless tobacco: Never Used     Comment: she stopped smoking 2 weeks ago with this illness and she agrees to continue with no further smoking at this point.  . Alcohol Use: No   OB History    No data available     Review of Systems   Constitutional: Positive for chills.  Gastrointestinal: Positive for nausea. Negative for vomiting.  Genitourinary: Positive for flank pain. Negative for dysuria, urgency, frequency and hematuria.  Neurological: Negative for numbness.  All other systems reviewed and are negative.     Allergies  Penicillins  Home Medications   Prior to Admission medications   Medication Sig Start Date End Date Taking? Authorizing Provider  ibuprofen (ADVIL,MOTRIN) 200 MG tablet Take 800 mg by mouth every 6 (six) hours as needed for mild pain.    Yes Historical Provider, MD  clonazePAM (KLONOPIN) 0.5 MG tablet Take 1 tablet (0.5 mg total) by mouth once. Patient not taking: Reported on 04/17/2014 12/24/12   Nehemiah Settle A Upstill, PA-C  cyclobenzaprine (FLEXERIL) 10 MG tablet Take 1 tablet (10 mg total) by mouth 2 (two) times daily as needed for muscle spasms. 04/17/14   [name] A Forcucci, PA-C  HYDROcodone-acetaminophen (NORCO/VICODIN) 5-325 MG per tablet Take 1 tablet by mouth every 6 (six) hours as needed for moderate pain or severe pain. 04/17/14   [name] A Forcucci, PA-C  naproxen (NAPROSYN) 500 MG tablet Take 1 tablet (500 mg total) by mouth 2 (two) times daily. 04/17/14   Corene Resnick A Forcucci, PA-C   BP 118/75 mmHg  Pulse 84  Temp(Src) 97.9 F (36.6 C) (Oral)  Resp 20  Wt 136 lb (61.689 kg)  SpO2 100%  LMP 03/31/2014 (Exact Date)  Physical Exam  Constitutional: She is oriented to person, place, and time. She appears well-developed and well-nourished. No distress.  HENT:  Head: Normocephalic and atraumatic.  Mouth/Throat: Oropharynx is clear and moist. No oropharyngeal exudate.  Eyes: Conjunctivae and EOM are normal. Pupils are equal, round, and reactive to light. No scleral icterus.  Neck: Normal range of motion. Neck supple. No JVD present. No thyromegaly present.  Cardiovascular: Normal rate, regular rhythm, normal heart sounds and intact distal pulses.  Exam reveals no gallop and no friction  rub.   No murmur heard. Pulmonary/Chest: Effort normal and breath sounds normal. No respiratory distress. She has no wheezes. She has no rales. She exhibits no tenderness.  Abdominal: Soft. Normal appearance and bowel sounds are normal. She exhibits no distension and no mass. There is generalized tenderness. There is no rigidity, no rebound, no guarding, no CVA tenderness, no tenderness at McBurney's point and negative Murphy's sign.  Musculoskeletal: Normal range of motion.  Patient rises slowly from sitting to standing.  They walk without an antalgic gait.  There is no evidence of erythema, ecchymosis, or gross deformity.  There is tenderness to palpation over left flank and left lower back.  Active ROM is limited full but painful.  Sensation to light touch is intact over all extremities.  Strength is symmetric and equal in all extremities.        Lymphadenopathy:    She has no cervical adenopathy.  Neurological: She is alert and oriented to person, place, and time. She has normal strength. No cranial nerve deficit or sensory deficit. Coordination normal.  Skin: Skin is warm and dry.  Psychiatric: She has a normal mood and affect. Her behavior is normal. Judgment and thought content normal.  Nursing note and vitals reviewed.   ED Course  Procedures (including critical care time) DIAGNOSTIC STUDIES: Oxygen Saturation is 100% on RA, normal by my interpretation.    COORDINATION OF CARE:  3:52 PM Will order labs.  Discussed suspicion of muscle strain but need to obtain urine r/o kidney stone/UTIPatient acknowledges and agrees with plan.    Labs Review Labs Reviewed  URINALYSIS, ROUTINE W REFLEX MICROSCOPIC - Abnormal; Notable for the following:    APPearance CLOUDY (*)    Hgb urine dipstick SMALL (*)    Leukocytes, UA TRACE (*)    All other components within normal limits  URINE MICROSCOPIC-ADD ON - Abnormal; Notable for the following:    Squamous Epithelial / LPF FEW (*)    All other  components within normal limits  URINE CULTURE  PREGNANCY, URINE    Imaging Review Ct Abdomen Pelvis Wo Contrast  04/17/2014   CLINICAL DATA:  43 year old female with left, abdominal pelvic pain with nausea.  EXAM: CT ABDOMEN AND PELVIS WITHOUT CONTRAST  TECHNIQUE: Multidetector CT imaging of the abdomen and pelvis was performed following the standard protocol without IV contrast.  COMPARISON:  03/20/2010 ultrasound  FINDINGS: The liver, spleen, adrenal glands, pancreas, gallbladder and kidneys are unremarkable except for a 1 cm left renal cyst.  Please note that parenchymal abnormalities may be missed without intravenous contrast.  There is no evidence of free fluid, enlarged lymph nodes, biliary dilation or abdominal aortic aneurysm.  The bowel, bladder and appendix are unremarkable. There is no evidence of bowel obstruction, abscess or pneumoperitoneum.  The uterus and adnexal regions are unremarkable.  No acute or suspicious bony abnormalities are identified.  IMPRESSION: No acute or significant abnormalities identified.   Electronically Signed   By: Merry Proud  Hu M.D.   On: 04/17/2014 17:56     EKG Interpretation None      MDM   Final diagnoses:  Flank pain   Patient is a 43 y.o. Female who presents to the ED with left flank pain.  There are no red flag symptoms.  Physical exam reveals no focal neurological deficits.  There full but painful ROM.  UA reveals some RBCs and trace leukocytes.  Patient denies urinary symptoms.  Will not treat at this time but will culture urine.  Given RBCs and possible renal colic CT abdomen without contrast performed which was negative.  Suspect that this is likely musculoskeletal in nature.  Will discharge the patient home with short course of hydrocodone and will also give flexeril and naproxen.  Patient is stable for discharge. pateint to return for signs of cauda equina.  Patient states understanding and agreement.   Patient seen by and discussed with Dr.  Doy Mince who agrees with the above plan and workup.    I personally performed the services described in this documentation, which was scribed in my presence. The recorded information has been reviewed and is accurate.   Cherylann Parr, PA-C 04/17/14 1811  Artis Delay, MD 04/17/14 [phone]

## 2014-04-17 NOTE — ED Notes (Signed)
Pt reports l/flank pain x2 weeks. Recent onset of nausea. Denies vomiting. Denies burning on urination

## 2014-04-19 LAB — URINE CULTURE
Colony Count: >=100000
Special Requests: NORMAL

## 2015-01-31 NOTE — H&P (Signed)
Taylor Knight  DICTATION # [id number] CSN# [id number]   Margarette Asal, MD 01/31/2015 10:09 AM

## 2015-02-01 NOTE — H&P (Signed)
Taylor Knight, BREMER NO.:  [id number]  MEDICAL RECORD NO.:  [id number]  LOCATION:                               FACILITY:  [facility]  PHYSICIAN:  Ralene Bathe. Matthew Saras, M.D.DATE OF BIRTH:  1971-03-21  DATE OF ADMISSION:  02/14/2015 DATE OF DISCHARGE:                             HISTORY & PHYSICAL   CHIEF COMPLAINT:  Severe dysmenorrhea.  HISTORY OF PRESENT ILLNESS:  A 44 year old, G1, P1, who has struggled over the last several years with worsening dysmenorrhea, not relieved by OTC, pain medicines such as Motrin.  __________8/16 in our office showed no significant abnormalities.  We discussed OCPs or other ovulation suppression to improve her pain.  She would prefer to proceed with definitive treatment in the form of LAVH.  This procedure including specific risks related to bleeding, infection, transfusion, adjacent organ injury with possible need to complete the surgery by open technique.  Other risks regarding wound infection, phlebitis, and her expected recovery time discussed.  The rationale for bilateral salpingectomy at that time to reduce later life risk of ovarian cancer discussed also.  PAST MEDICAL HISTORY:  Allergies:  To penicillin. Operations:  1 vaginal delivery.  She has had an outpatient foot surgery.  CURRENT MEDICATIONS:  Vicoprofen p.r.n.  REVIEW OF SYSTEMS:  Significant for headache, peptic ulcer disease.  FAMILY HISTORY:  Otherwise, negative.  SOCIAL HISTORY:  She is married.  Denies alcohol or drug use.  Smokes less than 1/2 PPD.  PHYSICAL EXAMINATION:  VITAL SIGNS:  Temp 98.2, blood pressure 122/82. HEENT:  Unremarkable. NECK:  Supple without masses. LUNGS:  Clear. CARDIOVASCULAR:  Regular rate and rhythm without murmurs, rubs, gallops noted. BREASTS:  Without masses.  ABDOMEN:  Soft, flat, nontender. GU:  Vulva, vagina, cervix normal.  Uterus mid position, upper limit of normal size.  Mobile adnexa, negative. EXTREMITIES:   Unremarkable. NEUROLOGIC:  Unremarkable.  IMPRESSION:  Severe dysmenorrhea.  PLAN:  LAVH, bilateral salpingectomy.  Procedure and risks discussed. Discussed as above.     Leonardo Makris M. Matthew Saras, M.D.     RMH/MEDQ  D:  01/31/2015  T:  01/31/2015  Job:  [id number]

## 2015-02-08 ENCOUNTER — Encounter (HOSPITAL_BASED_OUTPATIENT_CLINIC_OR_DEPARTMENT_OTHER): Payer: Self-pay | Admitting: *Deleted

## 2015-03-16 ENCOUNTER — Other Ambulatory Visit: Payer: Self-pay | Admitting: Obstetrics and Gynecology

## 2015-03-16 DIAGNOSIS — R928 Other abnormal and inconclusive findings on diagnostic imaging of breast: Secondary | ICD-10-CM

## 2015-03-22 ENCOUNTER — Ambulatory Visit
Admission: RE | Admit: 2015-03-22 | Discharge: 2015-03-22 | Disposition: A | Discharge status: Home / Self Care | Payer: 59 | Source: Ambulatory Visit | Attending: Obstetrics and Gynecology | Admitting: Obstetrics and Gynecology

## 2015-03-22 DIAGNOSIS — R928 Other abnormal and inconclusive findings on diagnostic imaging of breast: Secondary | ICD-10-CM

## 2015-04-07 ENCOUNTER — Encounter (HOSPITAL_BASED_OUTPATIENT_CLINIC_OR_DEPARTMENT_OTHER): Admission: RE | Payer: Self-pay | Source: Ambulatory Visit

## 2015-04-07 ENCOUNTER — Ambulatory Visit (HOSPITAL_BASED_OUTPATIENT_CLINIC_OR_DEPARTMENT_OTHER): Admission: RE | Admit: 2015-04-07 | Payer: Self-pay | Source: Ambulatory Visit | Admitting: Obstetrics and Gynecology

## 2015-04-07 HISTORY — DX: Personal history of other diseases of the digestive system: Z87.19

## 2015-04-07 HISTORY — DX: Asymptomatic varicose veins of unspecified lower extremity: I83.90

## 2015-04-07 HISTORY — DX: Personal history of other mental and behavioral disorders: Z86.59

## 2015-04-07 HISTORY — DX: Dysmenorrhea, unspecified: N94.6

## 2015-04-07 HISTORY — DX: Personal history of traumatic brain injury: Z87.820

## 2015-04-07 HISTORY — DX: Personal history of other specified conditions: Z87.898

## 2015-04-07 SURGERY — HYSTERECTOMY, VAGINAL, LAPAROSCOPY-ASSISTED
Anesthesia: General

## 2015-06-14 NOTE — H&P (Signed)
Taylor Knight  DICTATION # [id number] CSN# [id number]   Margarette Asal, MD 06/14/2015 11:50 AM

## 2015-06-15 NOTE — H&P (Signed)
Taylor Knight, FAN NO.:  [id number]  MEDICAL RECORD NO.:  [id number]  LOCATION:                                 FACILITY:  PHYSICIAN:  Ralene Bathe. Matthew Saras, M.D.    DATE OF BIRTH:  DATE OF ADMISSION:  07/05/2015 DATE OF DISCHARGE:                             HISTORY & PHYSICAL   CHIEF COMPLAINT:  Severe dysmenorrhea.  HPI:  A 45 year old, G1, P1, has a long history of dysmenorrhea that has not improved with conservative measures.  It has required injectable Toradol and also narcotics for pain relief during her period.  As part of her evaluation, she had endometrial biopsy in November 2016, that was benign, ultrasound dated August 16, showed no significant abnormality. The cavity appeared to be normal.  She presents at this time for LAVH, bilateral salpingectomy.  This procedure including specific risks related to bleeding, infection, transfusion, adjacent organ injury, the possible need to complete the surgery by open technique, other risks related to wound infection, phlebitis, and her expected recovery time reviewed which she understands and accepts.  Last Pap November 2016, was normal.  PAST MEDICAL HISTORY:  Allergies:  PENICILLIN. Prior Surgery:  None.  CURRENT MEDICATIONS:  Vicoprofen p.r.n.  REVIEW OF SYSTEMS:  Significant for history of hiatal hernia, UTI.  FAMILY HISTORY:  Otherwise negative.  SOCIAL HISTORY:  Denies drug use.  She does smoke less than 1/2 PPD. Denies alcohol use.  She is separated.  PHYSICAL EXAM:  VITAL SIGNS:  Temperature 98.2, blood pressure 110/60, weight is 158. HEENT:  Unremarkable. NECK:  Supple without masses. LUNGS:  Clear. CARDIOVASCULAR:  Regular rate and rhythm without murmurs, rubs, or gallops noted. BREASTS:  Without masses. ABDOMEN:  Soft, flat, nontender. PELVIC:  Normal external genitalia.  Vagina and cervix clear.  Uterus mid position, mobile, adnexa negative. EXTREMITIES:  Unremarkable. NEUROLOGIC:   Unremarkable.  IMPRESSION:  Severe dysmenorrhea.  PLAN:  LAVH, bilateral salpingectomy.  Procedure and risks discussed as above.     Rickesha Veracruz M. Matthew Saras, M.D.     RMH/MEDQ  D:  06/14/2015  T:  06/15/2015  Job:  [id number]

## 2015-06-27 ENCOUNTER — Encounter (HOSPITAL_BASED_OUTPATIENT_CLINIC_OR_DEPARTMENT_OTHER): Payer: Self-pay | Admitting: *Deleted

## 2015-06-27 NOTE — Progress Notes (Signed)
NPO AFTER MN.  ARRIVE AT 0600. NEEDS URINE PREG.  GETTING OTHER LAB WORK DONE MON. 07-04-2015 (CBC, BMET, T & S).

## 2015-07-04 DIAGNOSIS — I739 Peripheral vascular disease, unspecified: Secondary | ICD-10-CM | POA: Diagnosis not present

## 2015-07-04 DIAGNOSIS — N946 Dysmenorrhea, unspecified: Secondary | ICD-10-CM | POA: Diagnosis present

## 2015-07-04 DIAGNOSIS — R102 Pelvic and perineal pain: Secondary | ICD-10-CM | POA: Diagnosis not present

## 2015-07-04 DIAGNOSIS — F17210 Nicotine dependence, cigarettes, uncomplicated: Secondary | ICD-10-CM | POA: Diagnosis not present

## 2015-07-04 DIAGNOSIS — N72 Inflammatory disease of cervix uteri: Secondary | ICD-10-CM | POA: Diagnosis not present

## 2015-07-04 LAB — TYPE AND SCREEN
ABO/RH(D): A POS
Antibody Screen: NEGATIVE

## 2015-07-04 LAB — BASIC METABOLIC PANEL
Anion gap: 8 (ref 5–15)
BUN: 12 mg/dL (ref 6–20)
CO2: 26 mmol/L (ref 22–32)
Calcium: 9.6 mg/dL (ref 8.9–10.3)
Chloride: 105 mmol/L (ref 101–111)
Creatinine, Ser: 0.7 mg/dL (ref 0.44–1.00)
GFR calc Af Amer: >60 mL/min (ref >60)
GFR calc non Af Amer: >60 mL/min (ref >60)
Glucose, Bld: 83 mg/dL (ref 65–99)
Potassium: 4.3 mmol/L (ref 3.5–5.1)
Sodium: 139 mmol/L (ref 135–145)

## 2015-07-04 LAB — CBC
HCT: 43.6 % (ref 36.0–46.0)
Hemoglobin: 15 g/dL (ref 12.0–15.0)
MCH: 31.3 pg (ref 26.0–34.0)
MCHC: 34.4 g/dL (ref 30.0–36.0)
MCV: 90.8 fL (ref 78.0–100.0)
Platelets: 273 10*3/uL (ref 150–400)
RBC: 4.8 MIL/uL (ref 3.87–5.11)
RDW: 12 % (ref 11.5–15.5)
WBC: 7.7 10*3/uL (ref 4.0–10.5)

## 2015-07-04 LAB — ABO/RH: ABO/RH(D): A POS

## 2015-07-04 NOTE — Anesthesia Preprocedure Evaluation (Signed)
Anesthesia Evaluation  Patient identified by MRN, date of birth, ID band Patient awake    Reviewed: Allergy & Precautions, NPO status , Patient's Chart, lab work & pertinent test results  Airway Mallampati: II  TM Distance: >3 FB Neck ROM: Full    Dental no notable dental hx.    Pulmonary Current Smoker,    Pulmonary exam normal breath sounds clear to auscultation       Cardiovascular + Peripheral Vascular Disease  Normal cardiovascular exam Rhythm:Regular Rate:Normal     Neuro/Psych Seizures -,  PSYCHIATRIC DISORDERS Anxiety Depression    GI/Hepatic negative GI ROS, Neg liver ROS,   Endo/Other  negative endocrine ROS  Renal/GU negative Renal ROS     Musculoskeletal negative musculoskeletal ROS (+)   Abdominal   Peds  Hematology negative hematology ROS (+)   Anesthesia Other Findings   Reproductive/Obstetrics                             Anesthesia Physical Anesthesia Plan  ASA: II  Anesthesia Plan: General   Post-op Pain Management:    Induction: Intravenous  Airway Management Planned: Oral ETT  Additional Equipment: None  Intra-op Plan:   Post-operative Plan: Extubation in OR  Informed Consent: I have reviewed the patients History and Physical, chart, labs and discussed the procedure including the risks, benefits and alternatives for the proposed anesthesia with the patient or authorized representative who has indicated his/her understanding and acceptance.   Dental advisory given  Plan Discussed with: CRNA  Anesthesia Plan Comments:         Anesthesia Quick Evaluation

## 2015-07-05 ENCOUNTER — Encounter (HOSPITAL_BASED_OUTPATIENT_CLINIC_OR_DEPARTMENT_OTHER): Payer: Self-pay | Admitting: *Deleted

## 2015-07-05 ENCOUNTER — Ambulatory Visit (HOSPITAL_BASED_OUTPATIENT_CLINIC_OR_DEPARTMENT_OTHER): Payer: 59 | Admitting: Anesthesiology

## 2015-07-05 ENCOUNTER — Observation Stay (HOSPITAL_BASED_OUTPATIENT_CLINIC_OR_DEPARTMENT_OTHER)
Admission: RE | Admit: 2015-07-05 | Discharge: 2015-07-06 | Disposition: A | Discharge status: Home / Self Care | Payer: 59 | Source: Ambulatory Visit | Attending: Obstetrics and Gynecology | Admitting: Obstetrics and Gynecology

## 2015-07-05 ENCOUNTER — Encounter (HOSPITAL_COMMUNITY)
Admission: RE | Disposition: A | Discharge status: Home / Self Care | Payer: Self-pay | Source: Ambulatory Visit | Attending: Obstetrics and Gynecology

## 2015-07-05 DIAGNOSIS — N72 Inflammatory disease of cervix uteri: Secondary | ICD-10-CM | POA: Insufficient documentation

## 2015-07-05 DIAGNOSIS — R102 Pelvic and perineal pain: Secondary | ICD-10-CM | POA: Insufficient documentation

## 2015-07-05 DIAGNOSIS — I739 Peripheral vascular disease, unspecified: Secondary | ICD-10-CM | POA: Insufficient documentation

## 2015-07-05 DIAGNOSIS — F17210 Nicotine dependence, cigarettes, uncomplicated: Secondary | ICD-10-CM | POA: Insufficient documentation

## 2015-07-05 DIAGNOSIS — N946 Dysmenorrhea, unspecified: Principal | ICD-10-CM | POA: Insufficient documentation

## 2015-07-05 HISTORY — PX: LAPAROSCOPIC ASSISTED VAGINAL HYSTERECTOMY: SHX5398

## 2015-07-05 SURGERY — HYSTERECTOMY, VAGINAL, LAPAROSCOPY-ASSISTED
Anesthesia: General | Laterality: Bilateral

## 2015-07-05 MED ORDER — NEOSTIGMINE METHYLSULFATE 10 MG/10ML IV SOLN
INTRAVENOUS | Status: AC
  Filled 2015-07-05: qty 1

## 2015-07-05 MED ORDER — HYDROMORPHONE HCL 1 MG/ML IJ SOLN
0.2500 mg | INTRAMUSCULAR | Status: DC | PRN
  Administered 2015-07-05 (×3): 0.5 mg via INTRAVENOUS
  Filled 2015-07-05: qty 1

## 2015-07-05 MED ORDER — MEPERIDINE HCL 25 MG/ML IJ SOLN
6.2500 mg | INTRAMUSCULAR | Status: DC | PRN
  Filled 2015-07-05: qty 1

## 2015-07-05 MED ORDER — ONDANSETRON HCL 4 MG/2ML IJ SOLN
INTRAMUSCULAR | Status: DC | PRN
  Administered 2015-07-05: 4 mg via INTRAVENOUS

## 2015-07-05 MED ORDER — MIDAZOLAM HCL 2 MG/2ML IJ SOLN
INTRAMUSCULAR | Status: AC
  Filled 2015-07-05: qty 2

## 2015-07-05 MED ORDER — LACTATED RINGERS IV SOLN
INTRAVENOUS | Status: DC
  Administered 2015-07-05 (×2): via INTRAVENOUS
  Filled 2015-07-05: qty 1000

## 2015-07-05 MED ORDER — MENTHOL 3 MG MT LOZG
1.0000 | LOZENGE | OROMUCOSAL | Status: DC | PRN
  Filled 2015-07-05: qty 9

## 2015-07-05 MED ORDER — FENTANYL CITRATE (PF) 100 MCG/2ML IJ SOLN
INTRAMUSCULAR | Status: DC | PRN
  Administered 2015-07-05: 25 ug via INTRAVENOUS
  Administered 2015-07-05 (×3): 50 ug via INTRAVENOUS
  Administered 2015-07-05: 25 ug via INTRAVENOUS

## 2015-07-05 MED ORDER — PROPOFOL 10 MG/ML IV BOLUS
INTRAVENOUS | Status: AC
  Filled 2015-07-05: qty 20

## 2015-07-05 MED ORDER — ONDANSETRON HCL 4 MG/2ML IJ SOLN
4.0000 mg | Freq: Four times a day (QID) | INTRAMUSCULAR | Status: DC | PRN
  Filled 2015-07-05: qty 2

## 2015-07-05 MED ORDER — ROCURONIUM BROMIDE 100 MG/10ML IV SOLN
INTRAVENOUS | Status: DC | PRN
  Administered 2015-07-05: 50 mg via INTRAVENOUS
  Administered 2015-07-05: 10 mg via INTRAVENOUS

## 2015-07-05 MED ORDER — HYDROMORPHONE HCL 1 MG/ML IJ SOLN
INTRAMUSCULAR | Status: AC
  Filled 2015-07-05: qty 1

## 2015-07-05 MED ORDER — ROCURONIUM BROMIDE 100 MG/10ML IV SOLN
INTRAVENOUS | Status: AC
  Filled 2015-07-05: qty 1

## 2015-07-05 MED ORDER — GLYCOPYRROLATE 0.2 MG/ML IJ SOLN
INTRAMUSCULAR | Status: AC
  Filled 2015-07-05: qty 1

## 2015-07-05 MED ORDER — 0.9 % SODIUM CHLORIDE (POUR BTL) OPTIME
TOPICAL | Status: DC | PRN
  Administered 2015-07-05: 1000 mL
  Administered 2015-07-05: 500 mL

## 2015-07-05 MED ORDER — ONDANSETRON HCL 4 MG/2ML IJ SOLN
INTRAMUSCULAR | Status: AC
  Filled 2015-07-05: qty 2

## 2015-07-05 MED ORDER — FENTANYL CITRATE (PF) 250 MCG/5ML IJ SOLN
INTRAMUSCULAR | Status: AC
  Filled 2015-07-05: qty 5

## 2015-07-05 MED ORDER — KETOROLAC TROMETHAMINE 30 MG/ML IJ SOLN
INTRAMUSCULAR | Status: AC
  Filled 2015-07-05: qty 1

## 2015-07-05 MED ORDER — MORPHINE SULFATE 2 MG/ML IV SOLN
INTRAVENOUS | Status: DC
  Administered 2015-07-05: 1 mg via INTRAVENOUS
  Administered 2015-07-05: 13 mg via INTRAVENOUS
  Administered 2015-07-05: 12:00:00 via INTRAVENOUS
  Administered 2015-07-06: 3 mg via INTRAVENOUS
  Administered 2015-07-06: 2 mg via INTRAVENOUS
  Filled 2015-07-05 (×2): qty 25

## 2015-07-05 MED ORDER — KETOROLAC TROMETHAMINE 30 MG/ML IJ SOLN
30.0000 mg | Freq: Four times a day (QID) | INTRAMUSCULAR | Status: DC
Start: 2015-07-05 — End: 2015-07-06
  Filled 2015-07-05 (×5): qty 1

## 2015-07-05 MED ORDER — GENTAMICIN SULFATE 40 MG/ML IJ SOLN
INTRAMUSCULAR | Status: AC
  Administered 2015-07-05: 08:00:00 via INTRAVENOUS
  Filled 2015-07-05: qty 8.25

## 2015-07-05 MED ORDER — PROMETHAZINE HCL 25 MG/ML IJ SOLN
6.2500 mg | INTRAMUSCULAR | Status: DC | PRN
  Filled 2015-07-05: qty 1

## 2015-07-05 MED ORDER — BUTORPHANOL TARTRATE 1 MG/ML IJ SOLN
1.0000 mg | INTRAMUSCULAR | Status: DC | PRN
  Filled 2015-07-05: qty 2

## 2015-07-05 MED ORDER — DIPHENHYDRAMINE HCL 12.5 MG/5ML PO ELIX
12.5000 mg | ORAL_SOLUTION | Freq: Four times a day (QID) | ORAL | Status: DC | PRN
  Filled 2015-07-05: qty 5

## 2015-07-05 MED ORDER — DIPHENHYDRAMINE HCL 50 MG/ML IJ SOLN
12.5000 mg | Freq: Four times a day (QID) | INTRAMUSCULAR | Status: DC | PRN
  Filled 2015-07-05: qty 0.25

## 2015-07-05 MED ORDER — LIDOCAINE HCL (CARDIAC) 20 MG/ML IV SOLN
INTRAVENOUS | Status: DC | PRN
  Administered 2015-07-05: 80 mg via INTRAVENOUS

## 2015-07-05 MED ORDER — ONDANSETRON HCL 4 MG PO TABS
4.0000 mg | ORAL_TABLET | Freq: Four times a day (QID) | ORAL | Status: DC | PRN
  Filled 2015-07-05: qty 1

## 2015-07-05 MED ORDER — DEXTROSE IN LACTATED RINGERS 5 % IV SOLN
INTRAVENOUS | Status: DC
  Administered 2015-07-05 – 2015-07-06 (×2): via INTRAVENOUS
  Filled 2015-07-05: qty 1000

## 2015-07-05 MED ORDER — DEXAMETHASONE SODIUM PHOSPHATE 4 MG/ML IJ SOLN
INTRAMUSCULAR | Status: DC | PRN
  Administered 2015-07-05: 10 mg via INTRAVENOUS

## 2015-07-05 MED ORDER — DEXAMETHASONE SODIUM PHOSPHATE 10 MG/ML IJ SOLN
INTRAMUSCULAR | Status: AC
  Filled 2015-07-05: qty 1

## 2015-07-05 MED ORDER — KETOROLAC TROMETHAMINE 30 MG/ML IJ SOLN
30.0000 mg | Freq: Once | INTRAMUSCULAR | Status: DC
  Filled 2015-07-05: qty 1

## 2015-07-05 MED ORDER — NALOXONE HCL 0.4 MG/ML IJ SOLN
0.4000 mg | INTRAMUSCULAR | Status: DC | PRN
  Filled 2015-07-05: qty 1

## 2015-07-05 MED ORDER — SODIUM CHLORIDE 0.9% FLUSH
9.0000 mL | INTRAVENOUS | Status: DC | PRN
  Filled 2015-07-05: qty 9

## 2015-07-05 MED ORDER — PROPOFOL 10 MG/ML IV BOLUS
INTRAVENOUS | Status: DC | PRN
  Administered 2015-07-05: 200 mg via INTRAVENOUS

## 2015-07-05 MED ORDER — BUPIVACAINE HCL 0.25 % IJ SOLN
INTRAMUSCULAR | Status: DC | PRN
  Administered 2015-07-05: 7 mL

## 2015-07-05 MED ORDER — MIDAZOLAM HCL 5 MG/5ML IJ SOLN
INTRAMUSCULAR | Status: DC | PRN
  Administered 2015-07-05: 2 mg via INTRAVENOUS

## 2015-07-05 MED ORDER — KETOROLAC TROMETHAMINE 30 MG/ML IJ SOLN
30.0000 mg | Freq: Four times a day (QID) | INTRAMUSCULAR | Status: DC
Start: 2015-07-05 — End: 2015-07-06
  Administered 2015-07-05 – 2015-07-06 (×5): 30 mg via INTRAVENOUS
  Filled 2015-07-05 (×9): qty 1

## 2015-07-05 MED ORDER — IBUPROFEN 800 MG PO TABS
800.0000 mg | ORAL_TABLET | Freq: Three times a day (TID) | ORAL | Status: DC | PRN
  Administered 2015-07-05: 800 mg via ORAL
  Filled 2015-07-05 (×2): qty 1

## 2015-07-05 MED ORDER — KETOROLAC TROMETHAMINE 30 MG/ML IJ SOLN
INTRAMUSCULAR | Status: DC | PRN
  Administered 2015-07-05: 30 mg via INTRAVENOUS

## 2015-07-05 MED ORDER — GLYCOPYRROLATE 0.2 MG/ML IJ SOLN
INTRAMUSCULAR | Status: DC | PRN
  Administered 2015-07-05: 0.6 mg via INTRAVENOUS

## 2015-07-05 MED ORDER — NEOSTIGMINE METHYLSULFATE 10 MG/10ML IV SOLN
INTRAVENOUS | Status: DC | PRN
  Administered 2015-07-05: 4 mg via INTRAVENOUS

## 2015-07-05 MED ORDER — OXYCODONE-ACETAMINOPHEN 5-325 MG PO TABS
1.0000 | ORAL_TABLET | ORAL | Status: DC | PRN
  Administered 2015-07-05: 2 via ORAL
  Administered 2015-07-06: 1 via ORAL
  Administered 2015-07-06: 2 via ORAL
  Filled 2015-07-05 (×3): qty 2
  Filled 2015-07-05: qty 1

## 2015-07-05 SURGICAL SUPPLY — 68 items
BLADE SURG 10 STRL SS (BLADE) ×2 IMPLANT
BLADE SURG 11 STRL SS (BLADE) ×2 IMPLANT
CATH ROBINSON RED A/P 16FR (CATHETERS) ×2 IMPLANT
CLEANER CAUTERY TIP 5X5 PAD (MISCELLANEOUS) ×1 IMPLANT
COVER BACK TABLE 60X90IN (DRAPES) ×4 IMPLANT
COVER MAYO STAND STRL (DRAPES) ×4 IMPLANT
DRAPE LG THREE QUARTER DISP (DRAPES) ×2 IMPLANT
DRAPE UNDERBUTTOCKS STRL (DRAPE) ×2 IMPLANT
DRSG OPSITE POSTOP 3X4 (GAUZE/BANDAGES/DRESSINGS) IMPLANT
DRSG TEGADERM 2-3/8X2-3/4 SM (GAUZE/BANDAGES/DRESSINGS) ×1 IMPLANT
ELECT LIGASURE LONG (ELECTRODE) IMPLANT
ELECT LIGASURE SHORT 9 REUSE (ELECTRODE) ×2 IMPLANT
ELECT REM PT RETURN 9FT ADLT (ELECTROSURGICAL) ×2
ELECTRODE REM PT RTRN 9FT ADLT (ELECTROSURGICAL) ×1 IMPLANT
GLOVE BIO SURGEON STRL SZ 6.5 (GLOVE) ×2 IMPLANT
GLOVE BIO SURGEON STRL SZ7 (GLOVE) ×6 IMPLANT
GOWN STRL REUS W/ TWL LRG LVL3 (GOWN DISPOSABLE) ×4 IMPLANT
GOWN STRL REUS W/TWL LRG LVL3 (GOWN DISPOSABLE) ×8
HOLDER FOLEY CATH W/STRAP (MISCELLANEOUS) ×2 IMPLANT
KIT ROOM TURNOVER WOR (KITS) ×2 IMPLANT
LIQUID BAND (GAUZE/BANDAGES/DRESSINGS) ×3 IMPLANT
NDL HYPO 25X1 1.5 SAFETY (NEEDLE) ×1 IMPLANT
NDL INSUFFLATION 14GA 120MM (NEEDLE) ×1 IMPLANT
NDL SPNL 22GX3.5 QUINCKE BK (NEEDLE) IMPLANT
NEEDLE HYPO 25X1 1.5 SAFETY (NEEDLE) ×2 IMPLANT
NEEDLE INSUFFLATION 14GA 120MM (NEEDLE) ×2 IMPLANT
NEEDLE SPNL 22GX3.5 QUINCKE BK (NEEDLE) IMPLANT
NS IRRIG 500ML POUR BTL (IV SOLUTION) ×2 IMPLANT
PACK BASIN DAY SURGERY FS (CUSTOM PROCEDURE TRAY) ×2 IMPLANT
PACKING VAGINAL (PACKING) IMPLANT
PAD CLEANER CAUTERY TIP 5X5 (MISCELLANEOUS) ×1
PAD OB MATERNITY 4.3X12.25 (PERSONAL CARE ITEMS) ×2 IMPLANT
PADDING ION DISPOSABLE (MISCELLANEOUS) ×2 IMPLANT
PENCIL BUTTON HOLSTER BLD 10FT (ELECTRODE) ×2 IMPLANT
SEALER TISSUE G2 CVD JAW 45CM (ENDOMECHANICALS) ×2 IMPLANT
SET IRRIG TUBING LAPAROSCOPIC (IRRIGATION / IRRIGATOR) ×1 IMPLANT
SHEET LAVH (DRAPES) ×2 IMPLANT
SOLUTION ANTI FOG 6CC (MISCELLANEOUS) ×2 IMPLANT
SOLUTION ELECTROLUBE (MISCELLANEOUS) IMPLANT
SPONGE GAUZE 2X2 8PLY STRL LF (GAUZE/BANDAGES/DRESSINGS) ×1 IMPLANT
SPONGE GAUZE 4X4 12PLY STER LF (GAUZE/BANDAGES/DRESSINGS) IMPLANT
SPONGE LAP 4X18 X RAY DECT (DISPOSABLE) ×2 IMPLANT
STRIP CLOSURE SKIN 1/2X4 (GAUZE/BANDAGES/DRESSINGS) ×2 IMPLANT
STRIP CLOSURE SKIN 1/4X3 (GAUZE/BANDAGES/DRESSINGS) IMPLANT
SUT MNCRL 0 MO-4 VIOLET 18 CR (SUTURE) IMPLANT
SUT MON AB 2-0 CT1 36 (SUTURE) ×5 IMPLANT
SUT MONOCRYL 0 MO 4 18  CR/8 (SUTURE) ×1
SUT VIC AB 0 CT1 18XCR BRD8 (SUTURE) ×3 IMPLANT
SUT VIC AB 0 CT1 36 (SUTURE) IMPLANT
SUT VIC AB 0 CT1 8-18 (SUTURE) ×2
SUT VIC AB 2-0 CT1 (SUTURE) IMPLANT
SUT VIC AB 2-0 UR6 27 (SUTURE) IMPLANT
SUT VICRYL 0 TIES 12 18 (SUTURE) ×2 IMPLANT
SUT VICRYL 0 UR6 27IN ABS (SUTURE) IMPLANT
SUT VICRYL 4-0 PS2 18IN ABS (SUTURE) ×3 IMPLANT
SYR BULB IRRIGATION 50ML (SYRINGE) ×2 IMPLANT
SYR CONTROL 10ML LL (SYRINGE) ×2 IMPLANT
SYRINGE 10CC LL (SYRINGE) ×4 IMPLANT
TOWEL OR 17X24 6PK STRL BLUE (TOWEL DISPOSABLE) ×4 IMPLANT
TRAY DSU PREP LF (CUSTOM PROCEDURE TRAY) ×2 IMPLANT
TRAY FOLEY CATH SILVER 14FR (SET/KITS/TRAYS/PACK) ×2 IMPLANT
TROCAR OPTI TIP 5M 100M (ENDOMECHANICALS) ×2 IMPLANT
TROCAR XCEL DIL TIP R 11M (ENDOMECHANICALS) ×2 IMPLANT
TUBE CONNECTING 12X1/4 (SUCTIONS) ×4 IMPLANT
TUBING INSUFFLATION 10FT LAP (TUBING) ×2 IMPLANT
WARMER LAPAROSCOPE (MISCELLANEOUS) ×2 IMPLANT
WATER STERILE IRR 500ML POUR (IV SOLUTION) ×2 IMPLANT
YANKAUER SUCT BULB TIP NO VENT (SUCTIONS) ×2 IMPLANT

## 2015-07-05 NOTE — Anesthesia Postprocedure Evaluation (Signed)
Anesthesia Post Note  Patient: Taylor Knight  Procedure(s) Performed: Procedure(s) (LRB): LAPAROSCOPIC ASSISTED VAGINAL HYSTERECTOMY, bilateral salpingectomy (Bilateral)  Patient location during evaluation: PACU Anesthesia Type: General Level of consciousness: sedated and patient cooperative Pain management: pain level controlled Vital Signs Assessment: post-procedure vital signs reviewed and stable Respiratory status: spontaneous breathing Cardiovascular status: stable Anesthetic complications: no    Last Vitals:  Filed Vitals:   07/05/15 2000 07/05/15 2051  BP:  110/70  Pulse:  58  Temp:  36.8 C  Resp: 18 16    Last Pain:  Filed Vitals:   07/05/15 2052  PainSc: [place]

## 2015-07-05 NOTE — Op Note (Signed)
Patient ID: Taylor Knight, female   DOB: May 02, 1971, 45 y.o.   MRN: [id number]   Preoperative diagnosis: Dysmenorrhea, pelvic pain  Postoperative diagnosis: Same  Procedure: LAVH, bilateral salpingectomy  Surgeon: Matthew Saras  Asst.: McComb  EBL: 200 cc  Drains: Foley, to straight drain.  Procedure and findings:  The patient was taken the operating room after an adequate level of generalized anesthesia was obtained with the legs in stirrups the abdomen perineum and vagina prepped and draped in usual fashion for LAVH, the bladder was drained, EUA was carried out uterus was midposition, normal size, mobile, Hulka tenaculum was positioned. This was done after appropriate timeouts were taken attention directed to the abdomen  The subumbilical area was infiltrated with quarter percent Marcaine plain, small incision was made in the varies needle was introduced without difficulty. Its intra-abdominal position was verified by pressure water testing. After 2-1/2 L pneumoperitoneum syncopated, lap scopic trocar and sleeve were then introduced that difficulty. There was no evidence of any bleeding or trauma. 3 finger breaths above the symphysis in the midline a 5 mm trocar was inserted under direct visualization. With the patient in Trendelenburg the pelvic findings as follows:  Cul-de-sac anterior space uterus itself adnexa were unremarkable there may be a small fibroid distorting of the fundal area approximately 2 cm upper abdomen otherwise normal.  Atraumatic grasper was then used to grasp the distal right tube the Enseal device was then used to coagulated and divided the tube at the mesosalpinx up to the utero-ovarian junction. The utero-ovarian junction was then coagulated and divided down to and including the round ligament on that side the exact same repeated on the opposite side thus conserving both ovaries which were normal.  Vaginal portion the procedure was started legs were extended weighted speculum  was positioned cervix grasped with tenaculum, cervical vaginal mucosa was incised posterior culdotomy performed without difficulty, the anterior peritoneal reflection was identified entered sharply retractor was then used to gently elevate the bladder out of the field. In sequential manner using the hand-held LigaSure the uterosacral ligament cardinal ligament, uterine basket or pedicle and upper broad ligament pedicles were clamped and coagulated and divided. The fundus was then delivered posteriorly remaining pedicles were coagulated and divided vaginal cuff was then run from 3 to 9:00 with a locked 2-0 Monocryl suture for hemostasis. Prior to closure sponge, needle, history counts reported as correct 2. The vaginal mucosa closed right to left with interrupted 2-0 Vicryl sutures. This was hemostatic Foley catheter positioned at that point draining clear urine. Repeat laparoscopy was carried out, the Nezhat was then used to irrigate and aspirate the pelvis, the pressure was reduced and the operative site was inspected noted be hemostatic prior to closure sponge, needle, S precast reported as correct 2 the upper incision closed with 4-0 Monocryl subcuticular and Dermabond on the lower she tolerated this well went to recovery room in good condition.  Dictated with Bow MarD.

## 2015-07-05 NOTE — Anesthesia Procedure Notes (Signed)
Procedure Name: Intubation Date/Time: 07/05/2015 7:40 AM Performed by: Justice Rocher Pre-anesthesia Checklist: Patient identified, Emergency Drugs available, Suction available and Patient being monitored Patient Re-evaluated:Patient Re-evaluated prior to inductionOxygen Delivery Method: Circle System Utilized Preoxygenation: Pre-oxygenation with 100% oxygen Intubation Type: IV induction Ventilation: Mask ventilation without difficulty Laryngoscope Size: Mac and 4 Grade View: Grade II Tube type: Oral Tube size: 7.0 mm Number of attempts: 1 Airway Equipment and Method: Stylet and Oral airway Placement Confirmation: ETT inserted through vocal cords under direct vision,  positive ETCO2 and breath sounds checked- equal and bilateral Secured at: 22 cm Tube secured with: Tape Dental Injury: Teeth and Oropharynx as per pre-operative assessment

## 2015-07-05 NOTE — Progress Notes (Signed)
Patient attempted to void at 0608 unable to void.. Iv was started and fluid given in an attempt to hydrate the patient.  At 0630 patient made an attempt to void again without success.  She attempted to void after 500cc without success. Dr.Holland informed and proceeded with out urine.  The patient stated that there was not way that she was pregnant. Dr. Lissa Hoard also informed that patient unable to give a urine sample.Taylor Knight

## 2015-07-05 NOTE — Progress Notes (Signed)
The patient was re-examined with no change in status 

## 2015-07-05 NOTE — Transfer of Care (Signed)
Immediate Anesthesia Transfer of Care Note  Patient: Taylor Knight  Procedure(s) Performed: Procedure(s) (LRB): LAPAROSCOPIC ASSISTED VAGINAL HYSTERECTOMY, bilateral salpingectomy (Bilateral)  Patient Location: PACU  Anesthesia Type: General  Level of Consciousness: awake, sedated, patient cooperative and responds to stimulation  Airway & Oxygen Therapy: Patient Spontanous Breathing and Patient connected to face mask oxygen  Post-op Assessment: Report given to PACU RN, Post -op Vital signs reviewed and stable and Patient moving all extremities  Post vital signs: Reviewed and stable  Complications: No apparent anesthesia complications

## 2015-07-06 ENCOUNTER — Encounter (HOSPITAL_BASED_OUTPATIENT_CLINIC_OR_DEPARTMENT_OTHER): Payer: Self-pay | Admitting: Obstetrics and Gynecology

## 2015-07-06 DIAGNOSIS — N946 Dysmenorrhea, unspecified: Secondary | ICD-10-CM | POA: Diagnosis not present

## 2015-07-06 LAB — CBC
HCT: 36.9 % (ref 36.0–46.0)
Hemoglobin: 12.5 g/dL (ref 12.0–15.0)
MCH: 31.4 pg (ref 26.0–34.0)
MCHC: 33.9 g/dL (ref 30.0–36.0)
MCV: 92.7 fL (ref 78.0–100.0)
Platelets: 230 10*3/uL (ref 150–400)
RBC: 3.98 MIL/uL (ref 3.87–5.11)
RDW: 12.2 % (ref 11.5–15.5)
WBC: 17.3 10*3/uL — ABNORMAL HIGH (ref 4.0–10.5)

## 2015-07-06 MED ORDER — OXYCODONE-ACETAMINOPHEN 5-325 MG PO TABS: 1.0000 | ORAL_TABLET | Freq: Four times a day (QID) | ORAL | Status: DC | PRN

## 2015-07-06 MED ORDER — IBUPROFEN 800 MG PO TABS: 800.0000 mg | ORAL_TABLET | Freq: Three times a day (TID) | ORAL | Status: DC | PRN

## 2015-07-06 NOTE — Progress Notes (Signed)
Pt is tolerating food, ambulation and voiding qs. Pain not controlled with 1 percocet will try adding 2cd Percocet. If pain controlled desires discharge . Discharge instructions discussed with pt and husband until no further questions ask.

## 2015-07-06 NOTE — Discharge Summary (Signed)
Physician Discharge Summary  Patient ID: Taylor Knight MRN: [id number] DOB/AGE: 43-Dec-1972 43 y.o.  Admit date: 07/05/2015 Discharge date: 07/06/2015  Admission Diagnoses:dysmenorrhea,pelvic pain  Discharge Diagnoses: same Active Problems:   Pelvic pain in female   Discharged Condition: good  Hospital Course: adm for LAVH + bilat salpingectomy, was D/C on POSD1, afeb, tol PO  Consults: None  Significant Diagnostic Studies: labs:  CBC    Component Value Date/Time   WBC 17.3* 07/06/2015 0512   WBC 10.6* 12/24/2012 1048   RBC 3.98 07/06/2015 0512   RBC 4.44 12/24/2012 1048   HGB 12.5 07/06/2015 0512   HGB 13.9 12/24/2012 1048   HCT 36.9 07/06/2015 0512   HCT 42.1 12/24/2012 1048   PLT 230 07/06/2015 0512   MCV 92.7 07/06/2015 0512   MCV 94.8 12/24/2012 1048   MCH 31.4 07/06/2015 0512   MCH 31.3* 12/24/2012 1048   MCHC 33.9 07/06/2015 0512   MCHC 33.0 12/24/2012 1048   RDW 12.2 07/06/2015 0512   LYMPHSABS 1.1 12/24/2012 1325   MONOABS 0.6 12/24/2012 1325   EOSABS 0.1 12/24/2012 1325   BASOSABS 0.0 12/24/2012 1325       Treatments: surgery: LAVH, bilat salpingectomy  Discharge Exam: Blood pressure 111/69, pulse 66, temperature 98 F (36.7 C), temperature source Oral, resp. rate 19, height 5\' 6"  (1.676 m), weight 162 lb (73.483 kg), last menstrual period 06/09/2015, SpO2 100 %. abd soft + BS, incs C/D  Disposition: 01-Home or Self Care     Medication List    STOP taking these medications        naproxen sodium 220 MG tablet  Commonly known as:  ANAPROX      TAKE these medications        ibuprofen 800 MG tablet  Commonly known as:  ADVIL,MOTRIN  Take 1 tablet (800 mg total) by mouth every 8 (eight) hours as needed for moderate pain (mild pain).     oxyCODONE-acetaminophen 5-325 MG tablet  Commonly known as:  PERCOCET/ROXICET  Take 1-2 tablets by mouth every 6 (six) hours as needed for severe pain (moderate to severe pain (when tolerating fluids)).            Follow-up Information    Follow up with Margarette Asal, MD. Schedule an appointment as soon as possible for a visit in 1 week.   Specialty:  Obstetrics and Gynecology   Contact information:   [address] [place] [id number] [phone]       Signed: Margarette Asal 07/06/2015, 8:38 AM

## 2015-07-09 ENCOUNTER — Emergency Department (HOSPITAL_COMMUNITY)
Admission: EM | Admit: 2015-07-09 | Discharge: 2015-07-09 | Disposition: A | Discharge status: Home / Self Care | Payer: 59 | Attending: Emergency Medicine | Admitting: Emergency Medicine

## 2015-07-09 ENCOUNTER — Emergency Department (HOSPITAL_COMMUNITY): Payer: 59

## 2015-07-09 ENCOUNTER — Encounter (HOSPITAL_COMMUNITY): Payer: Self-pay

## 2015-07-09 DIAGNOSIS — R1033 Periumbilical pain: Secondary | ICD-10-CM | POA: Diagnosis not present

## 2015-07-09 DIAGNOSIS — Z8719 Personal history of other diseases of the digestive system: Secondary | ICD-10-CM | POA: Insufficient documentation

## 2015-07-09 DIAGNOSIS — Z8679 Personal history of other diseases of the circulatory system: Secondary | ICD-10-CM | POA: Insufficient documentation

## 2015-07-09 DIAGNOSIS — R1030 Lower abdominal pain, unspecified: Secondary | ICD-10-CM | POA: Diagnosis present

## 2015-07-09 DIAGNOSIS — F17210 Nicotine dependence, cigarettes, uncomplicated: Secondary | ICD-10-CM | POA: Diagnosis not present

## 2015-07-09 DIAGNOSIS — N939 Abnormal uterine and vaginal bleeding, unspecified: Secondary | ICD-10-CM | POA: Diagnosis not present

## 2015-07-09 DIAGNOSIS — R339 Retention of urine, unspecified: Secondary | ICD-10-CM | POA: Diagnosis not present

## 2015-07-09 DIAGNOSIS — Z880 Allergy status to penicillin: Secondary | ICD-10-CM | POA: Insufficient documentation

## 2015-07-09 DIAGNOSIS — M549 Dorsalgia, unspecified: Secondary | ICD-10-CM | POA: Diagnosis not present

## 2015-07-09 DIAGNOSIS — R110 Nausea: Secondary | ICD-10-CM | POA: Diagnosis not present

## 2015-07-09 DIAGNOSIS — Z8659 Personal history of other mental and behavioral disorders: Secondary | ICD-10-CM | POA: Diagnosis not present

## 2015-07-09 DIAGNOSIS — G8918 Other acute postprocedural pain: Secondary | ICD-10-CM

## 2015-07-09 LAB — CBC WITH DIFFERENTIAL/PLATELET
Basophils Absolute: 0 10*3/uL (ref 0.0–0.1)
Basophils Relative: 0 %
Eosinophils Absolute: 0.4 10*3/uL (ref 0.0–0.7)
Eosinophils Relative: 4 %
HCT: 38.3 % (ref 36.0–46.0)
Hemoglobin: 13.2 g/dL (ref 12.0–15.0)
Lymphocytes Relative: 16 %
Lymphs Abs: 1.8 10*3/uL (ref 0.7–4.0)
MCH: 31.4 pg (ref 26.0–34.0)
MCHC: 34.5 g/dL (ref 30.0–36.0)
MCV: 91.2 fL (ref 78.0–100.0)
Monocytes Absolute: 0.9 10*3/uL (ref 0.1–1.0)
Monocytes Relative: 8 %
Neutro Abs: 7.9 10*3/uL — ABNORMAL HIGH (ref 1.7–7.7)
Neutrophils Relative %: 72 %
Platelets: 257 10*3/uL (ref 150–400)
RBC: 4.2 MIL/uL (ref 3.87–5.11)
RDW: 12.1 % (ref 11.5–15.5)
WBC: 11 10*3/uL — ABNORMAL HIGH (ref 4.0–10.5)

## 2015-07-09 LAB — BASIC METABOLIC PANEL
Anion gap: 10 (ref 5–15)
BUN: 8 mg/dL (ref 6–20)
CO2: 26 mmol/L (ref 22–32)
Calcium: 9.1 mg/dL (ref 8.9–10.3)
Chloride: 106 mmol/L (ref 101–111)
Creatinine, Ser: 0.76 mg/dL (ref 0.44–1.00)
GFR calc Af Amer: >60 mL/min (ref >60)
GFR calc non Af Amer: >60 mL/min (ref >60)
Glucose, Bld: 90 mg/dL (ref 65–99)
Potassium: 4 mmol/L (ref 3.5–5.1)
Sodium: 142 mmol/L (ref 135–145)

## 2015-07-09 LAB — URINE MICROSCOPIC-ADD ON: Bacteria, UA: NONE SEEN

## 2015-07-09 LAB — URINALYSIS, ROUTINE W REFLEX MICROSCOPIC
Bilirubin Urine: NEGATIVE
Glucose, UA: NEGATIVE mg/dL
Ketones, ur: NEGATIVE mg/dL
Nitrite: POSITIVE — AB
Protein, ur: NEGATIVE mg/dL
Specific Gravity, Urine: 1.005 (ref 1.005–1.030)
pH: 5.5 (ref 5.0–8.0)

## 2015-07-09 MED ORDER — SODIUM CHLORIDE 0.9 % IV SOLN: Freq: Once | INTRAVENOUS | Status: DC

## 2015-07-09 MED ORDER — SODIUM CHLORIDE 0.9 % IV BOLUS (SEPSIS)
500.0000 mL | Freq: Once | INTRAVENOUS | Status: AC
  Administered 2015-07-09: 500 mL via INTRAVENOUS

## 2015-07-09 MED ORDER — ONDANSETRON HCL 4 MG/2ML IJ SOLN
4.0000 mg | Freq: Once | INTRAMUSCULAR | Status: AC
  Administered 2015-07-09: 4 mg via INTRAVENOUS
  Filled 2015-07-09: qty 2

## 2015-07-09 MED ORDER — OXYCODONE-ACETAMINOPHEN 5-325 MG PO TABS
1.0000 | ORAL_TABLET | Freq: Once | ORAL | Status: AC
  Administered 2015-07-09: 1 via ORAL
  Filled 2015-07-09: qty 1

## 2015-07-09 MED ORDER — IOHEXOL 300 MG/ML  SOLN
100.0000 mL | Freq: Once | INTRAMUSCULAR | Status: AC | PRN
  Administered 2015-07-09: 100 mL via INTRAVENOUS

## 2015-07-09 MED ORDER — IOHEXOL 300 MG/ML  SOLN
25.0000 mL | Freq: Once | INTRAMUSCULAR | Status: AC | PRN
  Administered 2015-07-09: 25 mL via ORAL

## 2015-07-09 NOTE — ED Provider Notes (Signed)
CSN: [id number]     Arrival date & time 07/09/15  0930 History   First MD Initiated Contact with Patient 07/09/15 0930     Chief Complaint  Patient presents with  . Abdominal Pain     HPI  Patient presents for evaluation of abdominal pain. She is status post laparoscopic assisted vaginal hysterectomy on 1-31 with Dr. Matthew Saras. Procedure performed secondary to chronic pelvic pain and dysmenorrhea.  He states that since she left the hospital she's been urinating small volumes at a time. Denies any frank dysuria or hematuria. States only small amounts. Increasing lower abdominal pressure and she presents here. Mild nausea this morning. Is having bowel movements. Minimal vaginal bleeding. States back pain as well.  Past Medical History  Diagnosis Date  . Depression   . Anxiety   . History of Mallory-Weiss syndrome   . History of concussion     2013  . History of seizure     05-01-2012--  idiology, xanax withdrawal seizure  . History of panic attacks   . Severe dysmenorrhea   . Varicose veins     bilateral legs   Past Surgical History  Procedure Laterality Date  . Bunionectomy Bilateral 2009  . Varicose vein surgery  06-07-2011    Laser right greater SV  . Transthoracic echocardiogram  09-25-2012   dr berry    normal perfusion study/  no ischemia/  normal LV function and wall motion, ef 63%  . Cardiovascular stress test  09-25-2012    normal nuclear study/  no ischemia/  normal LV function and wall motion , ef 63%  . Laparoscopic ovarian cystectomy  2002  . Laparoscopic assisted vaginal hysterectomy Bilateral 07/05/2015    Procedure: LAPAROSCOPIC ASSISTED VAGINAL HYSTERECTOMY, bilateral salpingectomy;  Surgeon: Molli Posey, MD;  Location: [facility];  Service: Gynecology;  Laterality: Bilateral;   Family History  Problem Relation Age of Onset  . Ulcers Father   . Ulcers Son    Social History  Substance Use Topics  . Smoking status: Current Some Day Smoker  -- 0.10 packs/day for 10 years    Types: Cigarettes  . Smokeless tobacco: Never Used     Comment: 3-4 cig per day  . Alcohol Use: No     Comment: occasional   OB History    No data available     Review of Systems  Constitutional: Negative for fever, chills, diaphoresis, appetite change and fatigue.  HENT: Negative for mouth sores, sore throat and trouble swallowing.   Eyes: Negative for visual disturbance.  Respiratory: Negative for cough, chest tightness, shortness of breath and wheezing.   Cardiovascular: Negative for chest pain.  Gastrointestinal: Positive for nausea. Negative for vomiting, abdominal pain, diarrhea and abdominal distention.  Endocrine: Negative for polydipsia, polyphagia and polyuria.  Genitourinary: Positive for decreased urine volume and vaginal bleeding. Negative for dysuria, urgency, frequency and hematuria.  Musculoskeletal: Negative for gait problem.  Skin: Negative for color change, pallor and rash.  Neurological: Negative for dizziness, syncope, light-headedness and headaches.  Hematological: Does not bruise/bleed easily.  Psychiatric/Behavioral: Negative for behavioral problems and confusion.      Allergies  Penicillins  Home Medications   Prior to Admission medications   Medication Sig Start Date End Date Taking? Authorizing Provider  ibuprofen (ADVIL,MOTRIN) 800 MG tablet Take 1 tablet (800 mg total) by mouth every 8 (eight) hours as needed for moderate pain (mild pain). 07/06/15  Yes Molli Posey, MD  oxyCODONE-acetaminophen (PERCOCET/ROXICET) 5-325 MG tablet Take 1-2  tablets by mouth every 6 (six) hours as needed for severe pain (moderate to severe pain (when tolerating fluids)). 07/06/15  Yes Molli Posey, MD   BP 123/71 mmHg  Pulse 89  Temp(Src) 97.8 F (36.6 C) (Oral)  Resp 18  SpO2 98%  LMP 06/09/2015 (Exact Date) Physical Exam  Constitutional: She is oriented to person, place, and time. She appears well-developed and  well-nourished. No distress.  HENT:  Head: Normocephalic.  Eyes: Conjunctivae are normal. Pupils are equal, round, and reactive to light. No scleral icterus.  Neck: Normal range of motion. Neck supple. No thyromegaly present.  Cardiovascular: Normal rate and regular rhythm.  Exam reveals no gallop and no friction rub.   No murmur heard. Pulmonary/Chest: Effort normal and breath sounds normal. No respiratory distress. She has no wheezes. She has no rales.  Abdominal: Soft. Bowel sounds are normal. She exhibits no distension. There is tenderness. There is no rebound.    Musculoskeletal: Normal range of motion.  Neurological: She is alert and oriented to person, place, and time.  Skin: Skin is warm and dry. No rash noted.  Psychiatric: She has a normal mood and affect. Her behavior is normal.    ED Course  Procedures (including critical care time) Labs Review Labs Reviewed  CBC WITH DIFFERENTIAL/PLATELET - Abnormal; Notable for the following:    WBC 11.0 (*)    Neutro Abs 7.9 (*)    All other components within normal limits  URINE CULTURE  BASIC METABOLIC PANEL  URINALYSIS, ROUTINE W REFLEX MICROSCOPIC (NOT AT [facility])    Imaging Review Ct Abdomen Pelvis W Contrast  07/09/2015  CLINICAL DATA:  Patient with pelvic pain and urinary retention. Hysterectomy 07/07/2015 EXAM: CT ABDOMEN AND PELVIS WITH CONTRAST TECHNIQUE: Multidetector CT imaging of the abdomen and pelvis was performed using the standard protocol following bolus administration of intravenous contrast. CONTRAST:  59mL OMNIPAQUE IOHEXOL 300 MG/ML SOLN, 148mL OMNIPAQUE IOHEXOL 300 MG/ML SOLN COMPARISON:  CT abdomen pelvis 04/17/2014 FINDINGS: Lower chest: Normal heart size. Dependent atelectasis within the bilateral lower lobes. No pleural effusion. Hepatobiliary: Liver is normal in size and contour. No focal hepatic lesion identified. Gallbladder is unremarkable. No intrahepatic or extrahepatic biliary ductal dilatation. Pancreas:  Unremarkable Spleen: Unremarkable Adrenals/Urinary Tract: The adrenal glands are normal. Kidneys enhance symmetrically with contrast. No hydronephrosis. There is urothelial thickening of the ureters bilaterally. Additionally there is wall thickening of the urinary bladder. Foley catheter is in place. Small amount of gas within the urinary bladder. Renal delayed images demonstrate excretion of contrast material into the bilateral renal collecting systems and proximal ureters. Delayed images through the urinary bladder demonstrate opacification of the distal ureters without evidence for contrast extravasation to suggest distal ureteral injury and/or leak. Contrast is present within the urinary bladder. Stomach/Bowel: There are a few mildly distended loops of small bowel within the central abdomen. No abnormal bowel wall thickening. No free intraperitoneal air. Vascular/Lymphatic: Normal caliber abdominal aorta. No retroperitoneal lymphadenopathy. Small amount of mixed density material within the pelvis the ovaries are unremarkable. Other: Patient status post hysterectomy. There is a small amount of mixed density material within the pelvis. The ovaries are unremarkable. Musculoskeletal: No aggressive or acute appearing osseous lesions. IMPRESSION: Postsurgical changes compatible with hysterectomy. Small amount of mixed density material within the pelvis likely represents postprocedural fluid and/or blood products. Mildly distended loops of small bowel within the central abdomen may represent postprocedural ileus. No hydronephrosis. Delayed images demonstrate excretion of contrast material into the renal collecting systems and  distal ureters without evidence for contrast extravasation to suggest urinary leak. Circumferential bladder wall thickening is nonspecific. Additionally wall thickening and enhancement of the ureters bilaterally is nonspecific. Recommend correlation with urinalysis to exclude infection.  Electronically Signed   By: Lovey Newcomer M.D.   On: 07/09/2015 14:50   I have personally reviewed and evaluated these images and lab results as part of my medical decision-making.   EKG Interpretation None      MDM   Final diagnoses:  Urinary retention  Post-operative pain    CT shows no acute abdominal eversion of bladder wall thickening. Fitted with a leg bag. Plan is home. Follow up with her surgeon regarding Foley removal in 5-7 days. Recheck if any worsening symptoms.    Tanna Furry, MD 07/09/15 [phone]

## 2015-07-09 NOTE — ED Notes (Signed)
Unable to collect all labs, nurse have been info

## 2015-07-09 NOTE — ED Notes (Signed)
Bed: WA13 Expected date:  Expected time:  Means of arrival:  Comments: abd pain 

## 2015-07-09 NOTE — ED Notes (Addendum)
Pt refused labs right now - wouldn't even let me look.   RN Tim aware - will try to pull from EMS line.  Pt wants fluids to run for awhile before having blood drawn.

## 2015-07-09 NOTE — Discharge Instructions (Signed)
Follow-up with your surgeon at your postoperative appointment.  Leg bag during the day, full bag at night.  Continue home pain medication regimen

## 2015-07-09 NOTE — ED Notes (Signed)
She underwent hysterectomy here 07-05-15--today c/o low abd. (bladder area) pain plus some urinary hesitancy today.  She arrives in no distress.

## 2015-07-10 LAB — URINE CULTURE: Culture: 20000

## 2015-07-20 ENCOUNTER — Inpatient Hospital Stay (HOSPITAL_COMMUNITY): Payer: 59

## 2015-07-20 ENCOUNTER — Encounter (HOSPITAL_COMMUNITY): Payer: Self-pay | Admitting: *Deleted

## 2015-07-20 ENCOUNTER — Inpatient Hospital Stay (HOSPITAL_COMMUNITY)
Admission: AD | Admit: 2015-07-20 | Discharge: 2015-07-20 | Disposition: A | Discharge status: Home / Self Care | Payer: 59 | Source: Ambulatory Visit | Attending: Obstetrics & Gynecology | Admitting: Obstetrics & Gynecology

## 2015-07-20 DIAGNOSIS — F17210 Nicotine dependence, cigarettes, uncomplicated: Secondary | ICD-10-CM | POA: Diagnosis not present

## 2015-07-20 DIAGNOSIS — I8393 Asymptomatic varicose veins of bilateral lower extremities: Secondary | ICD-10-CM | POA: Insufficient documentation

## 2015-07-20 DIAGNOSIS — K226 Gastro-esophageal laceration-hemorrhage syndrome: Secondary | ICD-10-CM | POA: Insufficient documentation

## 2015-07-20 DIAGNOSIS — F329 Major depressive disorder, single episode, unspecified: Secondary | ICD-10-CM | POA: Insufficient documentation

## 2015-07-20 DIAGNOSIS — G8918 Other acute postprocedural pain: Secondary | ICD-10-CM | POA: Insufficient documentation

## 2015-07-20 DIAGNOSIS — R109 Unspecified abdominal pain: Secondary | ICD-10-CM | POA: Diagnosis present

## 2015-07-20 DIAGNOSIS — F410 Panic disorder [episodic paroxysmal anxiety] without agoraphobia: Secondary | ICD-10-CM | POA: Diagnosis not present

## 2015-07-20 DIAGNOSIS — F419 Anxiety disorder, unspecified: Secondary | ICD-10-CM | POA: Diagnosis not present

## 2015-07-20 HISTORY — DX: Other specified bacterial intestinal infections: A04.8

## 2015-07-20 MED ORDER — IOHEXOL 300 MG/ML  SOLN
50.0000 mL | INTRAMUSCULAR | Status: AC
  Administered 2015-07-20: 50 mL via ORAL

## 2015-07-20 MED ORDER — KETOROLAC TROMETHAMINE 30 MG/ML IJ SOLN
30.0000 mg | INTRAMUSCULAR | Status: AC
  Administered 2015-07-20: 30 mg via INTRAVENOUS
  Filled 2015-07-20: qty 1

## 2015-07-20 MED ORDER — IOHEXOL 300 MG/ML  SOLN
100.0000 mL | Freq: Once | INTRAMUSCULAR | Status: AC | PRN
  Administered 2015-07-20: 100 mL via INTRAVENOUS

## 2015-07-20 MED ORDER — HYDROCODONE-ACETAMINOPHEN 5-325 MG PO TABS: 1.0000 | ORAL_TABLET | Freq: Four times a day (QID) | ORAL | Status: DC | PRN

## 2015-07-20 NOTE — Discharge Instructions (Signed)
Laparoscopically Assisted Vaginal Hysterectomy A laparoscopically assisted vaginal hysterectomy (LAVH) is a surgical procedure to remove the uterus and cervix, and sometimes the ovaries and fallopian tubes. During an LAVH, some of the surgical removal is done through the vagina, and the rest is done through a few small surgical cuts (incisions) in the abdomen.  This procedure is usually considered in women when a vaginal hysterectomy is not an option. Your health care provider will discuss the risks and benefits of the different surgical techniques at your appointment. Generally, recovery time is faster and there are fewer complications after laparoscopic procedures than after open incisional procedures. LET YOUR HEALTH CARE PROVIDER KNOW ABOUT:   Any allergies you have.  All medicines you are taking, including vitamins, herbs, eye drops, creams, and over-the-counter medicines.  Previous problems you or members of your family have had with the use of anesthetics.  Any blood disorders you have.  Previous surgeries you have had.  Medical conditions you have. RISKS AND COMPLICATIONS Generally, this is a safe procedure. However, as with any procedure, complications can occur. Possible complications include:  Allergies to medicines.  Difficulty breathing.  Bleeding.  Infection.  Damage to other structures near your uterus and cervix. BEFORE THE PROCEDURE  Ask your health care provider about changing or stopping your regular medicines.  Take certain medicines, such as a colon-emptying preparation, as directed.  Do not eat or drink anything for at least 8 hours before your surgery.  Stop smoking if you smoke. Stopping will improve your health after surgery.  Arrange for a ride home after surgery and for help at home during recovery. PROCEDURE   An IV tube will be put into one of your veins in order to give you fluids and medicines.  You will receive medicines to relax you and  medicines that make you sleep (general anesthetic).  You may have a flexible tube (catheter) put into your bladder to drain urine.  You may have a tube put through your nose or mouth that goes into your stomach (nasogastric tube). The nasogastric tube removes digestive fluids and prevents you from feeling nauseated and from vomiting.  Tight-fitting (compression) stockings will be placed on your legs to promote circulation.  Three to four small incisions will be made in your abdomen. An incision also will be made in your vagina. Probes and tools will be inserted into the small incisions. The uterus and cervix are removed (and possibly your ovaries and fallopian tubes) through your vagina as well as through the small incisions that were made in the abdomen.  Your vagina is then sewn back to normal. AFTER THE PROCEDURE  You may have a liquid diet temporarily. You will most likely return to, and tolerate, your usual diet the day after surgery.  You will be passing urine through a catheter. It will be removed the day after surgery.  Your temperature, breathing rate, heart rate, blood pressure, and oxygen level will be monitored regularly.  You will still wear compression stockings on your legs until you are able to move around.  You will use a special device or do breathing exercises to keep your lungs clear.  You will be encouraged to walk as soon as possible.   This information is not intended to replace advice given to you by your health care provider. Make sure you discuss any questions you have with your health care provider.   Document Released: 05/10/2011 Document Revised: 06/11/2014 Document Reviewed: 12/04/2012 Elsevier Interactive Patient Education 2016 Elsevier   Inc.  

## 2015-07-20 NOTE — MAU Provider Note (Signed)
History     CSN: [id number]  Arrival date and time: 07/20/15 1626   First Provider Initiated Contact with Patient 07/20/15 1715      Chief Complaint  Patient presents with  . Abdominal Pain   HPI Taylor Knight 45 y.o. presents to MAU after an LAVH now complaining of abdominal pain.  She had a CT scan 2 weeks ago, shortly after her surgery.  She has had abdominal pain x 2-3 days ago - new onset, located lower abdominal bilaterally.  It is intermittent, sharp and severe.  No change with eating.  The pain started after taking a walk 3 days ago, but she has been resting more since then.  Ibuprofen helps temporarily.  Denies fever, weakness.     OB History    No data available      Past Medical History  Diagnosis Date  . Depression   . Anxiety   . History of Mallory-Weiss syndrome   . History of concussion     2013  . History of seizure     05-01-2012--  idiology, xanax withdrawal seizure  . History of panic attacks   . Severe dysmenorrhea   . Varicose veins     bilateral legs  . H. pylori infection     Past Surgical History  Procedure Laterality Date  . Bunionectomy Bilateral 2009  . Varicose vein surgery  06-07-2011    Laser right greater SV  . Transthoracic echocardiogram  09-25-2012   dr berry    normal perfusion study/  no ischemia/  normal LV function and wall motion, ef 63%  . Cardiovascular stress test  09-25-2012    normal nuclear study/  no ischemia/  normal LV function and wall motion , ef 63%  . Laparoscopic ovarian cystectomy  2002  . Laparoscopic assisted vaginal hysterectomy Bilateral 07/05/2015    Procedure: LAPAROSCOPIC ASSISTED VAGINAL HYSTERECTOMY, bilateral salpingectomy;  Surgeon: Molli Posey, MD;  Location: [facility];  Service: Gynecology;  Laterality: Bilateral;    Family History  Problem Relation Age of Onset  . Ulcers Father   . Ulcers Son     Social History  Substance Use Topics  . Smoking status: Current Some Day  Smoker -- 0.10 packs/day for 10 years    Types: Cigarettes  . Smokeless tobacco: Never Used     Comment: 3-4 cig per day  . Alcohol Use: No     Comment: occasional    Allergies:  Allergies  Allergen Reactions  . Penicillins Hives    Has patient had a PCN reaction causing immediate rash, facial/tongue/throat swelling, SOB or lightheadedness with hypotension: Yes Has patient had a PCN reaction causing severe rash involving mucus membranes or skin necrosis: Yes Has patient had a PCN reaction that required hospitalization No Has patient had a PCN reaction occurring within the last 10 years: No  If all of the above answers are "NO", then may proceed with Cephalosporin use.     Prescriptions prior to admission  Medication Sig Dispense Refill Last Dose  . ibuprofen (ADVIL,MOTRIN) 800 MG tablet Take 1 tablet (800 mg total) by mouth every 8 (eight) hours as needed for moderate pain (mild pain). 30 tablet 1 07/20/2015 at Unknown time  . oxyCODONE-acetaminophen (PERCOCET/ROXICET) 5-325 MG tablet Take 1-2 tablets by mouth every 6 (six) hours as needed for severe pain (moderate to severe pain (when tolerating fluids)). (Patient not taking: Reported on 07/20/2015) 30 tablet 0 Not Taking at Unknown time    ROS Pertinent  ROS in HPI.  All other systems are negative.   Physical Exam   Blood pressure 107/69, pulse 79, temperature 97.7 F (36.5 C), temperature source Oral, resp. rate 18, last menstrual period 06/09/2015.  Physical Exam  Constitutional: She is oriented to person, place, and time. She appears well-developed and well-nourished. No distress.  HENT:  Head: Normocephalic and atraumatic.  Eyes: Conjunctivae and EOM are normal.  Neck: Normal range of motion. Neck supple.  Cardiovascular: Normal rate and normal heart sounds.   Respiratory: Effort normal and breath sounds normal. No respiratory distress.  GI: Soft. She exhibits no distension. There is tenderness.  Sites of incision appear  to be well healing.  Significant tenderness throughout abdomen  Neurological: She is alert and oriented to person, place, and time.  Skin: Skin is warm and dry.  Psychiatric: She has a normal mood and affect. Her behavior is normal.   No results found for this or any previous visit (from the past 24 hour(s)). Ct Abdomen Pelvis W Contrast  07/20/2015  CLINICAL DATA:  45 year old female with left lower abdominal pain. History of laparoscopic assisted vaginal hysterectomy 2 weeks previously EXAM: CT ABDOMEN AND PELVIS WITH CONTRAST TECHNIQUE: Multidetector CT imaging of the abdomen and pelvis was performed using the standard protocol following bolus administration of intravenous contrast. CONTRAST:  133mL OMNIPAQUE IOHEXOL 300 MG/ML  SOLN COMPARISON:  Prior CT abdomen/ pelvis 07/09/2015 FINDINGS: Lower Chest: The lung bases are clear. Visualized cardiac structures are within normal limits for size. No pericardial effusion. Unremarkable visualized distal thoracic esophagus. Abdomen: Unremarkable CT appearance of the stomach, duodenum, spleen, adrenal glands and pancreas. Normal hepatic contour and morphology. No discrete hepatic lesion. Gallbladder is unremarkable. No intra or extrahepatic biliary ductal dilatation. Unremarkable appearance of the bilateral kidneys. No focal solid lesion, hydronephrosis or nephrolithiasis. Stable probable left renal cyst. She No evidence of obstruction or focal bowel wall thickening. Normal appendix in the right lower quadrant. The terminal ileum is unremarkable. No free fluid or suspicious adenopathy. Pelvis: Surgical changes of partial hysterectomy. Both adnexa are identified in appear to be within normal limits. The bladder is moderately distended with urine. Trace interstitial stranding in the fat surrounding the vaginal cuff has a decreased compared to the prior CT scan. There is no free fluid or evidence of abscess. Musculoskeletal: No acute fracture or aggressive appearing  lytic or blastic osseous lesion. Vascular: Atherosclerotic vascular disease without significant stenosis or aneurysmal dilatation. IMPRESSION: 1. Expected appearance of postoperative changes related to recent partial hysterectomy. No CT evidence of free fluid, abscess or other complication. 2. Atherosclerotic vascular calcifications. Electronically Signed   By: Jacqulynn Cadet M.D.   On: 07/20/2015 20:01    MAU Course  Procedures  MDM Discussed with Dr. Lynnette Caffey.  CT abdomen and pelvis with PO contrast ordered.   Nurse reports pt needing pain medication.  IV Toradol ordered.   Pt to CT.  Report given and care turned over to [facility] [place] 2045: D/W Dr. Lynnette Caffey, ok for DC home and FU with Dr. Matthew Saras early next week.  2054: Reviewed CT results with the patient. She also remembers that she went for a long walk in the park on Sunday, and the pain started Monday.    Assessment and Plan   1. Post-operative pain    DC home Comfort measures reviewed  RX: vicodin #[id number]  Return to MAU as needed FU with OB as planned  Follow-up Information    Schedule an appointment as soon as possible  for a visit with Margarette Asal, MD.   Specialty:  Obstetrics and Gynecology   Contact information:   [address] [place] [phone]       Loel Lofty 07/20/2015, 5:16 PM

## 2015-07-20 NOTE — MAU Note (Signed)
Had hysterectomy  (LAVH) 2 wks ago.  On Monday started having pain in lower abd, more on the left side.  When she examined her in office- that was the area that was most tender, so sent her over for a CT.

## 2016-07-12 ENCOUNTER — Encounter: Payer: Self-pay | Admitting: Gastroenterology

## 2016-08-15 ENCOUNTER — Ambulatory Visit (INDEPENDENT_AMBULATORY_CARE_PROVIDER_SITE_OTHER): Payer: BLUE CROSS/BLUE SHIELD | Admitting: Gastroenterology

## 2016-08-15 ENCOUNTER — Other Ambulatory Visit (INDEPENDENT_AMBULATORY_CARE_PROVIDER_SITE_OTHER): Payer: BLUE CROSS/BLUE SHIELD

## 2016-08-15 ENCOUNTER — Encounter: Payer: Self-pay | Admitting: Gastroenterology

## 2016-08-15 VITALS — BP 130/80 | HR 72 | Ht 66.0 in | Wt 177.8 lb

## 2016-08-15 DIAGNOSIS — K5900 Constipation, unspecified: Secondary | ICD-10-CM

## 2016-08-15 DIAGNOSIS — R140 Abdominal distension (gaseous): Secondary | ICD-10-CM | POA: Diagnosis not present

## 2016-08-15 DIAGNOSIS — R1084 Generalized abdominal pain: Secondary | ICD-10-CM

## 2016-08-15 LAB — TSH: TSH: 1.75 u[IU]/mL (ref 0.35–4.50)

## 2016-08-15 MED ORDER — NA SULFATE-K SULFATE-MG SULF 17.5-3.13-1.6 GM/177ML PO SOLN: 1.0000 | Freq: Once | ORAL | 0 refills | Status: AC

## 2016-08-15 NOTE — Patient Instructions (Signed)
If you are age 44 or older, your body mass index should be between 23-30. Your Body mass index is 28.7 kg/m. If this is out of the aforementioned range listed, please consider follow up with your Primary Care Provider.  If you are age 36 or younger, your body mass index should be between 19-25. Your Body mass index is 28.7 kg/m. If this is out of the aformentioned range listed, please consider follow up with your Primary Care Provider.   You have been scheduled for a colonoscopy. Please follow written instructions given to you at your visit today.  Please pick up your prep supplies at the pharmacy within the next 1-3 days. If you use inhalers (even only as needed), please bring them with you on the day of your procedure. Your physician has requested that you go to www.startemmi.com and enter the access code given to you at your visit today. This web site gives a general overview about your procedure. However, you should still follow specific instructions given to you by our office regarding your preparation for the procedure.  Thank you for choosing [facility]  Dr Wilfrid Lund III

## 2016-08-15 NOTE — Progress Notes (Signed)
[facility] Consult Note:  History: Taylor Knight 08/15/2016  Referring physician: Pcp Not In System  Taylor Cheryle Horsfall, MD ([place] Knight)  Reason for consult/chief complaint: Bloated (Patient feels full with small amount of food, distended abdomen); Constipation (Bowel movements 1-2 times weekly); and Abdominal Pain (Epigastric pain after meals)   Subjective  HPI:  This is a 46 year old woman referred by [facility] in [place] for abdominal pain and constipation. She reports at least 6 months of generalized abdominal pain that is sometimes more focused in the epigastrium. It is worse after meals and she often is visible abdominal distention after eating.[place] Knight describes early satiety without nausea vomiting or dysphagia. She has developed constipation, with a bowel movement at most twice a week. He often has no urge to have a BM, and has not engaged in any measures such as pressure on the peritoneum in order to aid BMs. She denies rectal bleeding, and says she has been gaining some weight over the last year despite a restrictive diet. She feels she has done everything she can to improve the constipation, consuming plenty of water, healthy diet full of fiber and getting regular exercise.  She was seen by [place] [facility] the [facility] in 2011 for upper abdominal pain and vomiting. EGD was normal, biopsies revealed H. pylori. This was treated with clarithromycin and metronidazole, and a follow-up breath test confirmed eradication. She was last seen by Taylor Knight in early 2012.  ROS:  Review of Systems  Constitutional: Negative for appetite change and unexpected weight change.  HENT: Negative for mouth sores and voice change.   Eyes: Negative for pain and redness.  Respiratory: Negative for cough and shortness of breath.   Cardiovascular: Negative for chest pain and palpitations.  Genitourinary: Negative for dysuria and hematuria.    Musculoskeletal: Negative for arthralgias and myalgias.  Skin: Negative for pallor and rash.  Neurological: Negative for weakness and headaches.  Hematological: Negative for adenopathy.     Past Medical History: Past Medical History:  Diagnosis Date  . Anxiety   . Depression   . H. pylori infection   . History of concussion    2013  . History of Mallory-Weiss syndrome   . History of panic attacks   . History of seizure    05-01-2012--  idiology, xanax withdrawal seizure  . Severe dysmenorrhea   . Varicose veins    bilateral legs     Past Surgical History: Past Surgical History:  Procedure Laterality Date  . BUNIONECTOMY Bilateral 2009  . CARDIOVASCULAR STRESS TEST  09-25-2012   normal nuclear study/  no ischemia/  normal LV function and wall motion , ef 63%  . LAPAROSCOPIC ASSISTED VAGINAL HYSTERECTOMY Bilateral 07/05/2015   Procedure: LAPAROSCOPIC ASSISTED VAGINAL HYSTERECTOMY, bilateral salpingectomy;  Surgeon: Taylor Posey, MD;  Location: [place];  Service: Gynecology;  Laterality: Bilateral;  . LAPAROSCOPIC OVARIAN CYSTECTOMY  2002  . TRANSTHORACIC ECHOCARDIOGRAM  09-25-2012   dr berry   normal perfusion study/  no ischemia/  normal LV function and wall motion, ef 63%  . VARICOSE VEIN SURGERY  06-07-2011   Laser right greater SV     Family History: Family History  Problem Relation Age of Onset  . Ulcers Father   . Ulcers Son   . Colitis Son   Her son had ulcerative colitis and within the last couple of years had a total colectomy.  Social History: Social History   Social History  .  Marital status: Married    Spouse name: N/A  . Number of children: 1  . Years of education: N/A   Social History Main Topics  . Smoking status: Current Some Day Smoker    Packs/day: 0.10    Years: 10.00    Types: Cigarettes  . Smokeless tobacco: Never Used     Comment: 3-4 cig per day  . Alcohol use No     Comment: occasional  . Drug use: No  .  Sexual activity: Yes    Birth control/ protection: None   Other Topics Concern  . None   Social History Narrative  . None  She works at a [employer], academic  Allergies: Allergies  Allergen Reactions  . Penicillins Hives    Has patient had a PCN reaction causing immediate rash, facial/tongue/throat swelling, SOB or lightheadedness with hypotension: Yes Has patient had a PCN reaction causing severe rash involving mucus membranes or skin necrosis: Yes Has patient had a PCN reaction that required hospitalization No Has patient had a PCN reaction occurring within the last 10 years: No  If all of the above answers are "NO", then may proceed with Cephalosporin use.     Outpatient Meds: Current Outpatient Prescriptions  Medication Sig Dispense Refill  . ibuprofen (ADVIL,MOTRIN) 800 MG tablet Take 1 tablet (800 mg total) by mouth every 8 (eight) hours as needed for moderate pain (mild pain). 30 tablet 1  . Na Sulfate-K Sulfate-Mg Sulf 17.5-3.13-1.6 GM/180ML SOLN Take 1 kit by mouth once. 354 mL 0   No current facility-administered medications for this visit.       ___________________________________________________________________ Objective   Exam:  BP 130/80   Pulse 72   Ht '5\' 6"'  (1.676 m)   Wt 177 lb 12.8 oz (80.6 kg)   LMP 06/09/2015 (Exact Date)   BMI 28.70 kg/m    General: this is a(n) Well-appearing woman, no muscle wasting   Eyes: sclera anicteric, no redness  ENT: oral mucosa moist without lesions, no cervical or supraclavicular lymphadenopathy, good dentition  CV: RRR without murmur, S1/S2, no JVD, no peripheral edema  Resp: clear to auscultation bilaterally, normal RR and effort noted  Knight: soft, no tenderness, with active bowel sounds. No guarding or palpable organomegaly noted.  Skin; warm and dry, no rash or jaundice noted  Neuro: awake, alert and oriented x 3. Normal gross motor function and fluent speech Rectal exam was deferred: Colonoscopy planned in near  future. Labs:  Extensive recent outpatient labs from 08/02/2016 were reviewed through care everywhere.  CBC only notable for WBC of 11.2, otherwise normal. CMP normal. H. pylori stool antigen negative, stool pathogen panel normal  Last TSH on file was from 4 years ago  Radiologic Studies:  CT scan abdomen and pelvis with contrast within the last 2 weeks was normal  Assessment: Encounter Diagnoses  Name Primary?  . Constipation, unspecified constipation type Yes  . Generalized abdominal pain   . Abdominal bloating     TSH will be checked to rule out hypothyroidism, especially with her reported weight gain. Assuming that is normal, the seems most likely to be motility/IBS, less likely obstructive.  Plan:  TSH today Colonoscopy next week to rule out obstructive cause.  If negative, trial of Linzess or Amitiza.  Thank you for the courtesy of this consult.  Please call me with any questions or concerns.  Nelida Meuse III  CC: Pcp Not In System

## 2016-08-22 ENCOUNTER — Encounter: Payer: Self-pay | Admitting: Gastroenterology

## 2016-08-22 ENCOUNTER — Ambulatory Visit (AMBULATORY_SURGERY_CENTER): Payer: BLUE CROSS/BLUE SHIELD | Admitting: Gastroenterology

## 2016-08-22 VITALS — BP 127/70 | HR 69 | Temp 98.4°F | Resp 10 | Ht 66.0 in | Wt 177.0 lb

## 2016-08-22 DIAGNOSIS — K5900 Constipation, unspecified: Secondary | ICD-10-CM

## 2016-08-22 DIAGNOSIS — K635 Polyp of colon: Secondary | ICD-10-CM

## 2016-08-22 DIAGNOSIS — D125 Benign neoplasm of sigmoid colon: Secondary | ICD-10-CM | POA: Diagnosis not present

## 2016-08-22 MED ORDER — SODIUM CHLORIDE 0.9 % IV SOLN: 500.0000 mL | INTRAVENOUS | Status: DC

## 2016-08-22 NOTE — Progress Notes (Signed)
To Pacu  Pt awake and alert, report to RN 

## 2016-08-22 NOTE — Op Note (Signed)
[place] Patient Name: Taylor Knight Procedure Date: 08/22/2016 11:33 AM MRN: [id number] Endoscopist: Mallie Mussel L. Loletha Carrow , MD Age: 46 Referring MD:  Date of Birth: [birth year] Gender: Female Account #: [id number] Procedure:                Colonoscopy Indications:              Constipation and bloating. Medicines:                Monitored Anesthesia Care Procedure:                Pre-Anesthesia Assessment:                           - Prior to the procedure, a History and Physical                            was performed, and patient medications and                            allergies were reviewed. The patient's tolerance of                            previous anesthesia was also reviewed. The risks                            and benefits of the procedure and the sedation                            options and risks were discussed with the patient.                            All questions were answered, and informed consent                            was obtained. Prior Anticoagulants: The patient has                            taken no previous anticoagulant or antiplatelet                            agents. ASA Grade Assessment: II - A patient with                            mild systemic disease. After reviewing the risks                            and benefits, the patient was deemed in                            satisfactory condition to undergo the procedure.                           After obtaining informed consent, the colonoscope  was passed under direct vision. Throughout the                            procedure, the patient's blood pressure, pulse, and                            oxygen saturations were monitored continuously. The                            Colonoscope was introduced through the anus and                            advanced to the the cecum, identified by                            appendiceal orifice and ileocecal valve. The                             colonoscopy was performed without difficulty. The                            patient tolerated the procedure well. The quality                            of the bowel preparation was fair. The ileocecal                            valve, appendiceal orifice, and rectum were                            photographed. The quality of the bowel preparation                            was evaluated using the BBPS [facility] Bowel                            Preparation Scale) with scores of: Right Colon = 2,                            Transverse Colon = 2 and Left Colon = 1 (fibrous                            food residue). The total BBPS score equals 5. After                            lavage. The bowel preparation used was SUPREP. Scope In: 11:44:48 AM Scope Out: 11:57:19 AM Scope Withdrawal Time: 0 hours 9 minutes 35 seconds  Total Procedure Duration: 0 hours 12 minutes 31 seconds  Findings:                 The perianal and digital rectal examinations were  normal. Exam was done awake in order to assess                            ano-rectal muscle function. There was normal                            resting and voluntary tone. There was normal pelvic                            descent with Valsalva.                           A 2 mm polyp was found in the distal sigmoid colon.                            The polyp was sessile. The polyp was removed with a                            cold snare. Resection and retrieval were complete.                           The exam was otherwise without abnormality on                            direct and retroflexion views. Complications:            No immediate complications. Estimated Blood Loss:     Estimated blood loss: none. Impression:               - Preparation of the colon was fair.                           - One 2 mm polyp in the distal sigmoid colon,                            removed with a cold  snare. Resected and retrieved.                           - The examination was otherwise normal on direct                            and retroflexion views.                           Overall clinical picture most consistent with                            chronic idiopathic constipation. Recommendation:           - Patient has a contact number available for                            emergencies. The signs and symptoms of potential  delayed complications were discussed with the                            patient. Return to normal activities tomorrow.                            Written discharge instructions were provided to the                            patient.                           - Resume previous diet.                           - Continue present medications.                           - Await pathology results.                           - Repeat colonoscopy is recommended for                            surveillance. The colonoscopy date will be                            determined after pathology results from today's                            exam become available for review. If adenoma,                            repeat colonoscopy in 5 years. If hyperplastic,                            repeat in 7 years dye to suboptimal prep in the                            left colon.                           - Trial of Linzess 145 micrograms once daily (16                            days of samples given). Start the day after                            tomorrow.                           Call my office in 2 weeks with an update on                            symptoms. Melburn Treiber L. Loletha Carrow, MD 08/22/2016 12:08:16 PM This report has been signed electronically.

## 2016-08-22 NOTE — Progress Notes (Signed)
Called to room to assist during endoscopic procedure.  Patient ID and intended procedure confirmed with present staff. Received instructions for my participation in the procedure from the performing physician.  

## 2016-08-23 ENCOUNTER — Telehealth: Payer: Self-pay

## 2016-08-23 NOTE — Telephone Encounter (Signed)
  Follow up Call-  Call back number 08/22/2016  Post procedure Call Back phone  # [phone]  Permission to leave phone message Yes  Some recent data might be hidden     Patient questions:  Do you have a fever, pain , or abdominal swelling? Yes.   Pain Score  1 *  Have you tolerated food without any problems? Yes.    Have you been able to return to your normal activities? Yes.    Do you have any questions about your discharge instructions: Diet   No. Medications  No. Follow up visit  No.  Do you have questions or concerns about your Care? No.  Actions: * If pain score is 4 or above: No action needed, pain <4.

## 2016-08-28 ENCOUNTER — Encounter: Payer: Self-pay | Admitting: Gastroenterology

## 2016-09-07 ENCOUNTER — Other Ambulatory Visit: Payer: Self-pay

## 2016-09-07 ENCOUNTER — Telehealth: Payer: Self-pay | Admitting: Gastroenterology

## 2016-09-07 NOTE — Telephone Encounter (Signed)
Routed to DOD, pt of Dr. Raford Pitcher to patient, she started on Linzess 145 mg one every day, states it worked well for the first couple of days, now she will have a bm but not complete, feels very bloated. She has been limiting her food intake because she feels so bloated. She does not take any Miralax or other medication to help with constipation.

## 2016-09-07 NOTE — Telephone Encounter (Signed)
I would consider trying to increase the dose of Linzess to 136mcg twice daily and see if this helps. If no improvement next week she can call back for reassessment.

## 2016-09-07 NOTE — Telephone Encounter (Signed)
Samples given of Linzess 290 mcg to take one tablet daily, also given coupon card.

## 2016-09-07 NOTE — Telephone Encounter (Signed)
We have a samples of the 267mcg tab if she wishes to come to the clinic to see if this works prior to prescribing it. Thanks

## 2016-09-07 NOTE — Telephone Encounter (Signed)
She was given samples only and is completely out of these. Can you give me an order for the [place]. Thanks.

## 2016-09-26 ENCOUNTER — Telehealth: Payer: Self-pay | Admitting: Gastroenterology

## 2016-09-26 NOTE — Telephone Encounter (Signed)
Left message for pt to call back  °

## 2016-09-27 NOTE — Telephone Encounter (Signed)
Pt states the Linzess 290 worked well for 1 week but then she developed diarrhea. States she is having 5-6 diarrhea stools/day. Pt wants to know if there is something else that she could try other than Linzess. Please advise.

## 2016-09-27 NOTE — Telephone Encounter (Signed)
Pt aware and will try Linzess 290 every other day.

## 2016-09-27 NOTE — Telephone Encounter (Signed)
I am afraid that tends to be the problem with that medicine (and also Amitiza, which is the other medicine choice for this condition).  After stopping Linzess for several days to get it out of her system, the only options that I can see are go to 145 micrograms, one tablet twice day...  or 290 micrograms , one tablet every other day.  I am afraid that is all we have available for medicines.

## 2016-10-21 ENCOUNTER — Encounter: Payer: Self-pay | Admitting: *Deleted

## 2016-12-03 ENCOUNTER — Telehealth: Payer: Self-pay | Admitting: Gastroenterology

## 2016-12-03 ENCOUNTER — Other Ambulatory Visit: Payer: Self-pay

## 2016-12-03 MED ORDER — LINACLOTIDE 290 MCG PO CAPS: 290.0000 ug | ORAL_CAPSULE | Freq: Every day | ORAL | 2 refills | Status: DC

## 2016-12-03 NOTE — Telephone Encounter (Signed)
Refill request for Linzess 290 mcg. One tab a day. Pt uses as one every other day. Last seen 09-2016.

## 2017-02-22 ENCOUNTER — Other Ambulatory Visit: Payer: Self-pay | Admitting: Gastroenterology

## 2017-05-17 ENCOUNTER — Other Ambulatory Visit: Payer: Self-pay | Admitting: Gastroenterology

## 2017-05-17 NOTE — Telephone Encounter (Signed)
Patient requesting refills for Linzess 290 mcg one every other day. Last seen 08-2016 .

## 2017-05-20 MED ORDER — LINACLOTIDE 290 MCG PO CAPS: ORAL_CAPSULE | ORAL | 4 refills | Status: AC

## 2017-05-20 NOTE — Addendum Note (Signed)
Addended by: Nelida Meuse on: 05/20/2017 12:45 PM   Modules accepted: Orders

## 2017-05-20 NOTE — Telephone Encounter (Signed)
Refills for Linzess 290 mcg 1 every other day. Last seen 09-2016.

## 2017-07-31 ENCOUNTER — Other Ambulatory Visit: Payer: Self-pay

## 2017-07-31 ENCOUNTER — Ambulatory Visit (INDEPENDENT_AMBULATORY_CARE_PROVIDER_SITE_OTHER): Payer: BLUE CROSS/BLUE SHIELD | Admitting: Emergency Medicine

## 2017-07-31 ENCOUNTER — Encounter: Payer: Self-pay | Admitting: Emergency Medicine

## 2017-07-31 ENCOUNTER — Ambulatory Visit: Payer: Self-pay | Admitting: *Deleted

## 2017-07-31 VITALS — BP 133/84 | HR 78 | Temp 99.3°F | Resp 16 | Ht 66.25 in | Wt 177.8 lb

## 2017-07-31 DIAGNOSIS — E060 Acute thyroiditis: Secondary | ICD-10-CM | POA: Insufficient documentation

## 2017-07-31 DIAGNOSIS — M542 Cervicalgia: Secondary | ICD-10-CM

## 2017-07-31 MED ORDER — AZITHROMYCIN 250 MG PO TABS: ORAL_TABLET | ORAL | 0 refills | Status: DC

## 2017-07-31 MED ORDER — DICLOFENAC SODIUM 75 MG PO TBEC: 75.0000 mg | DELAYED_RELEASE_TABLET | Freq: Two times a day (BID) | ORAL | 0 refills | Status: AC

## 2017-07-31 NOTE — Telephone Encounter (Signed)
The pt called with complaints of swelling of left neck around the thyroid area since Thursday 07/25/17;the pt says that the area is painful to the touch;  she is also having pain behind her left ear and left jaw line; she states that has taken ibuprofen with no relief; pt also complains of fatigue; she also states that last month she had a upper respiratory viral infection and pain in her left [facility] ([facility]) but did not receive medications; recommendations made per nurse triage nurse triage to include seeing a physician when office is open (within 3 days); pt offered and accepted appointment with Dr Taylor Knight, [address] at [facility] today at 1740; pt verbalizes understanding; spoke with Almyra Free regarding appointment. Reason for Disposition . [1] MODERATE neck pain (e.g., interferes with normal activities AND [2] present > 3 days  Answer Assessment - Initial Assessment Questions 1. ONSET: "When did the pain begin?"      07/26/17 2. LOCATION: "Where does it hurt?"      Left neck 3. PATTERN "Does the pain come and go, or has it been constant since it started?"      constant 4. SEVERITY: "How bad is the pain?"  (Scale 1-10; or mild, moderate, severe)   - MILD (1-3): doesn't interfere with normal activities    - MODERATE (4-7): interferes with normal activities or awakens from sleep    - SEVERE (8-10):  excruciating pain, unable to do any normal activities      moderate 5. RADIATION: "Does the pain go anywhere else, shoot into your arms?"     Left ear and jaw line  6. CORD SYMPTOMS: "Any weakness or numbness of the arms or legs?"     no 7. CAUSE: "What do you think is causing the neck pain?"     Stress at work earlier in the day 8. NECK OVERUSE: "Any recent activities that involved turning or twisting the neck?"     no 9. OTHER SYMPTOMS: "Do you have any other symptoms?" (e.g., headache, fever, chest pain, difficulty breathing, neck swelling)    Pain near left ear and jaw line  10.  PREGNANCY: "Is there any chance you are pregnant?" "When was your last menstrual period?"       No hysterectomy  Protocols used: NECK PAIN OR STIFFNESS-A-AH

## 2017-07-31 NOTE — Progress Notes (Signed)
Taylor Knight 47 y.o.   Chief Complaint  Patient presents with  . Neck Pain    x 6 days - LEFT side per patient to the jaw and ear, tender to the touch    HISTORY OF PRESENT ILLNESS: This is a 47 y.o. female complaining of left-sided neck pain progressively getting worse over the past 6 days.  Denies any difficulty swallowing.  Still able to eat and drink.  Denies nausea or vomiting.  Denies fever or chills.  Denies trauma or injury.  Denies any sore throat or ear pain.  Pain is sharp and radiates up the left side of her neck along the jawline.  HPI   Prior to Admission medications   Medication Sig Start Date End Date Taking? Authorizing Provider  ibuprofen (ADVIL,MOTRIN) 800 MG tablet Take 1 tablet (800 mg total) by mouth every 8 (eight) hours as needed for moderate pain (mild pain). 07/06/15  Yes Molli Posey, MD  linaclotide [facility]) 290 MCG CAPS capsule TAKE ONE CAPSULE BY MOUTH DAILY BEFORE BREAKFAST 05/20/17  Yes Danis, Kirke Corin, MD    Allergies  Allergen Reactions  . Penicillins Hives    Has patient had a PCN reaction causing immediate rash, facial/tongue/throat swelling, SOB or lightheadedness with hypotension: Yes Has patient had a PCN reaction causing severe rash involving mucus membranes or skin necrosis: Yes Has patient had a PCN reaction that required hospitalization No Has patient had a PCN reaction occurring within the last 10 years: No  If all of the above answers are "NO", then may proceed with Cephalosporin use.     Patient Active Problem List   Diagnosis Date Noted  . Pelvic pain in female 07/05/2015  . Syncope 05/11/2012  . Altered mental state 05/11/2012  . Seizure ([place]) 04/30/2012  . Depression with anxiety 03/24/2012  . Varicose veins of lower extremities with other complications 02/72/5366    Past Medical History:  Diagnosis Date  . Anxiety   . Depression   . H. pylori infection   . History of concussion    2013  . History of Mallory-Weiss  syndrome   . History of panic attacks   . History of seizure    05-01-2012--  idiology, xanax withdrawal seizure  . Severe dysmenorrhea   . Varicose veins    bilateral legs    Past Surgical History:  Procedure Laterality Date  . BUNIONECTOMY Bilateral 2009  . CARDIOVASCULAR STRESS TEST  09-25-2012   normal nuclear study/  no ischemia/  normal LV function and wall motion , ef 63%  . LAPAROSCOPIC ASSISTED VAGINAL HYSTERECTOMY Bilateral 07/05/2015   Procedure: LAPAROSCOPIC ASSISTED VAGINAL HYSTERECTOMY, bilateral salpingectomy;  Surgeon: Molli Posey, MD;  Location: [place];  Service: Gynecology;  Laterality: Bilateral;  . LAPAROSCOPIC OVARIAN CYSTECTOMY  2002  . TRANSTHORACIC ECHOCARDIOGRAM  09-25-2012   dr berry   normal perfusion study/  no ischemia/  normal LV function and wall motion, ef 63%  . VARICOSE VEIN SURGERY  06-07-2011   Laser right greater SV    Social History   Socioeconomic History  . Marital status: Married    Spouse name: Not on file  . Number of children: 1  . Years of education: Not on file  . Highest education level: Not on file  Social Needs  . Financial resource strain: Not on file  . Food insecurity - worry: Not on file  . Food insecurity - inability: Not on file  . Transportation needs - medical: Not on  file  . Transportation needs - non-medical: Not on file  Occupational History  . Not on file  Tobacco Use  . Smoking status: Current Some Day Smoker    Packs/day: 0.10    Years: 10.00    Pack years: 1.00    Types: Cigarettes  . Smokeless tobacco: Never Used  . Tobacco comment: 3-4 cig per day  Substance and Sexual Activity  . Alcohol use: No    Comment: occasional  . Drug use: No  . Sexual activity: Yes    Birth control/protection: None  Other Topics Concern  . Not on file  Social History Narrative  . Not on file    Family History  Problem Relation Age of Onset  . Ulcers Father   . Ulcers Son   . Colitis Son       Review of Systems  Constitutional: Negative.  Negative for fever.  HENT: Negative.  Negative for congestion, sinus pain and sore throat.   Eyes: Negative.  Negative for discharge and redness.  Respiratory: Negative.  Negative for cough and shortness of breath.   Cardiovascular: Negative.  Negative for chest pain and palpitations.  Gastrointestinal: Negative.  Negative for abdominal pain, diarrhea, nausea and vomiting.  Genitourinary: Negative.  Negative for dysuria and hematuria.  Musculoskeletal: Negative.  Negative for joint pain, myalgias and neck pain.  Skin: Negative.  Negative for rash.  Neurological: Negative.  Negative for dizziness and headaches.  Endo/Heme/Allergies: Negative.   All other systems reviewed and are negative.   Vitals:   07/31/17 1738  BP: 133/84  Pulse: 78  Resp: 16  Temp: 99.3 F (37.4 C)  SpO2: 99%    Physical Exam  Constitutional: She is oriented to person, place, and time. She appears well-developed and well-nourished.  HENT:  Head: Normocephalic and atraumatic.  Right Ear: Hearing, tympanic membrane, external ear and ear canal normal.  Left Ear: Hearing, tympanic membrane, external ear and ear canal normal.  Nose: Nose normal.  Mouth/Throat: Oropharynx is clear and moist.  Eyes: Conjunctivae and EOM are normal. Pupils are equal, round, and reactive to light.  Neck: Trachea normal and full passive range of motion without pain. No JVD present. Carotid bruit is not present. No thyroid mass present.    Cardiovascular: Normal rate, regular rhythm and normal heart sounds.  Pulmonary/Chest: Effort normal and breath sounds normal.  Musculoskeletal: Normal range of motion.  Neurological: She is alert and oriented to person, place, and time. No sensory deficit. She exhibits normal muscle tone.  Skin: Skin is warm and dry. Capillary refill takes less than 2 seconds. No rash noted.  Psychiatric: She has a normal mood and affect. Her behavior is  normal.  Vitals reviewed.    ASSESSMENT & PLAN: Shy was seen today for neck pain.  Diagnoses and all orders for this visit:  Neck pain -     CBC with Differential/Platelet -     Comprehensive metabolic panel -     Thyroid Profile -     diclofenac (VOLTAREN) 75 MG EC tablet; Take 1 tablet (75 mg total) by mouth 2 (two) times daily for 5 days.  Acute thyroiditis -     CBC with Differential/Platelet -     Comprehensive metabolic panel -     Thyroid Profile -     azithromycin (ZITHROMAX) 250 MG tablet; Sig as indicated   Follow-up in 2 days.   Agustina Caroli, MD [facility]

## 2017-07-31 NOTE — Patient Instructions (Signed)
     IF you received an x-ray today, you will receive an invoice from [facility]. Please contact [facility] at [phone] with questions or concerns regarding your invoice.   IF you received labwork today, you will receive an invoice from [facility]. Please contact [facility] at [phone] with questions or concerns regarding your invoice.   Our billing staff will not be able to assist you with questions regarding bills from these companies.  You will be contacted with the lab results as soon as they are available. The fastest way to get your results is to activate your My Chart account. Instructions are located on the last page of this paperwork. If you have not heard from us regarding the results in 2 weeks, please contact this office.     

## 2017-08-01 LAB — COMPREHENSIVE METABOLIC PANEL
ALT: 16 IU/L (ref 0–32)
AST: 12 IU/L (ref 0–40)
Albumin/Globulin Ratio: 2 (ref 1.2–2.2)
Albumin: 4.9 g/dL (ref 3.5–5.5)
Alkaline Phosphatase: 49 IU/L (ref 39–117)
BUN/Creatinine Ratio: 13 (ref 9–23)
BUN: 9 mg/dL (ref 6–24)
Bilirubin Total: 0.3 mg/dL (ref 0.0–1.2)
CO2: 25 mmol/L (ref 20–29)
Calcium: 9.6 mg/dL (ref 8.7–10.2)
Chloride: 103 mmol/L (ref 96–106)
Creatinine, Ser: 0.7 mg/dL (ref 0.57–1.00)
GFR calc Af Amer: 122 mL/min/{1.73_m2} (ref >59)
GFR calc non Af Amer: 106 mL/min/{1.73_m2} (ref >59)
Globulin, Total: 2.4 g/dL (ref 1.5–4.5)
Glucose: 95 mg/dL (ref 65–99)
Potassium: 4.5 mmol/L (ref 3.5–5.2)
Sodium: 140 mmol/L (ref 134–144)
Total Protein: 7.3 g/dL (ref 6.0–8.5)

## 2017-08-01 LAB — CBC WITH DIFFERENTIAL/PLATELET
Basophils Absolute: 0.1 10*3/uL (ref 0.0–0.2)
Basos: 0 %
EOS (ABSOLUTE): 0.4 10*3/uL (ref 0.0–0.4)
Eos: 3 %
Hematocrit: 41.3 % (ref 34.0–46.6)
Hemoglobin: 14.3 g/dL (ref 11.1–15.9)
Immature Grans (Abs): 0 10*3/uL (ref 0.0–0.1)
Immature Granulocytes: 0 %
Lymphocytes Absolute: 3.3 10*3/uL — ABNORMAL HIGH (ref 0.7–3.1)
Lymphs: 28 %
MCH: 31.2 pg (ref 26.6–33.0)
MCHC: 34.6 g/dL (ref 31.5–35.7)
MCV: 90 fL (ref 79–97)
Monocytes Absolute: 0.5 10*3/uL (ref 0.1–0.9)
Monocytes: 4 %
Neutrophils Absolute: 7.5 10*3/uL — ABNORMAL HIGH (ref 1.4–7.0)
Neutrophils: 65 %
Platelets: 280 10*3/uL (ref 150–379)
RBC: 4.58 x10E6/uL (ref 3.77–5.28)
RDW: 12.3 % (ref 12.3–15.4)
WBC: 11.8 10*3/uL — ABNORMAL HIGH (ref 3.4–10.8)

## 2017-08-01 LAB — THYROID PANEL
Free Thyroxine Index: 1.6 (ref 1.2–4.9)
T3 Uptake Ratio: 21 % — ABNORMAL LOW (ref 24–39)
T4, Total: 7.5 ug/dL (ref 4.5–12.0)

## 2017-08-02 ENCOUNTER — Encounter: Payer: Self-pay | Admitting: Emergency Medicine

## 2017-08-02 ENCOUNTER — Other Ambulatory Visit: Payer: Self-pay

## 2017-08-02 ENCOUNTER — Ambulatory Visit (INDEPENDENT_AMBULATORY_CARE_PROVIDER_SITE_OTHER): Payer: BLUE CROSS/BLUE SHIELD | Admitting: Emergency Medicine

## 2017-08-02 VITALS — BP 123/83 | HR 78 | Temp 98.3°F | Resp 16 | Wt 177.8 lb

## 2017-08-02 DIAGNOSIS — E060 Acute thyroiditis: Secondary | ICD-10-CM

## 2017-08-02 DIAGNOSIS — M542 Cervicalgia: Secondary | ICD-10-CM | POA: Diagnosis not present

## 2017-08-02 NOTE — Patient Instructions (Signed)
     IF you received an x-ray today, you will receive an invoice from [facility]. Please contact [facility] at [phone] with questions or concerns regarding your invoice.   IF you received labwork today, you will receive an invoice from [facility]. Please contact [facility] at [phone] with questions or concerns regarding your invoice.   Our billing staff will not be able to assist you with questions regarding bills from these companies.  You will be contacted with the lab results as soon as they are available. The fastest way to get your results is to activate your My Chart account. Instructions are located on the last page of this paperwork. If you have not heard from us regarding the results in 2 weeks, please contact this office.     

## 2017-08-02 NOTE — Progress Notes (Signed)
Taylor Knight 47 y.o.   Chief Complaint  Patient presents with  . Thyroid Problem    follow up     HISTORY OF PRESENT ILLNESS: This is a 47 y.o. female here for follow-up of left-sided neck pain, suspected thyroiditis.  Seen by me 2 days ago and started on Zithromax and diclofenac.  Feels about the same.  No better no worse.  No significant change.  No new symptomatology.  HPI   Prior to Admission medications   Medication Sig Start Date End Date Taking? Authorizing Provider  azithromycin (ZITHROMAX) 250 MG tablet Sig as indicated 07/31/17  Yes Tucker Minter, Ines Bloomer, MD  diclofenac (VOLTAREN) 75 MG EC tablet Take 1 tablet (75 mg total) by mouth 2 (two) times daily for 5 days. 07/31/17 08/05/17 Yes Claretha Townshend, Ines Bloomer, MD  ibuprofen (ADVIL,MOTRIN) 800 MG tablet Take 1 tablet (800 mg total) by mouth every 8 (eight) hours as needed for moderate pain (mild pain). 07/06/15  Yes Molli Posey, MD  linaclotide [facility]) 290 MCG CAPS capsule TAKE ONE CAPSULE BY MOUTH DAILY BEFORE BREAKFAST Patient not taking: Reported on 08/02/2017 05/20/17   Doran Stabler, MD    Allergies  Allergen Reactions  . Penicillins Hives    Has patient had a PCN reaction causing immediate rash, facial/tongue/throat swelling, SOB or lightheadedness with hypotension: Yes Has patient had a PCN reaction causing severe rash involving mucus membranes or skin necrosis: Yes Has patient had a PCN reaction that required hospitalization No Has patient had a PCN reaction occurring within the last 10 years: No  If all of the above answers are "NO", then may proceed with Cephalosporin use.     Patient Active Problem List   Diagnosis Date Noted  . Neck pain 07/31/2017  . Acute thyroiditis 07/31/2017  . Pelvic pain in female 07/05/2015  . Syncope 05/11/2012  . Altered mental state 05/11/2012  . Seizure ([place]) 04/30/2012  . Depression with anxiety 03/24/2012  . Varicose veins of lower extremities with other complications  86/57/8469    Past Medical History:  Diagnosis Date  . Anxiety   . Depression   . H. pylori infection   . History of concussion    2013  . History of Mallory-Weiss syndrome   . History of panic attacks   . History of seizure    05-01-2012--  idiology, xanax withdrawal seizure  . Severe dysmenorrhea   . Varicose veins    bilateral legs    Past Surgical History:  Procedure Laterality Date  . BUNIONECTOMY Bilateral 2009  . CARDIOVASCULAR STRESS TEST  09-25-2012   normal nuclear study/  no ischemia/  normal LV function and wall motion , ef 63%  . LAPAROSCOPIC ASSISTED VAGINAL HYSTERECTOMY Bilateral 07/05/2015   Procedure: LAPAROSCOPIC ASSISTED VAGINAL HYSTERECTOMY, bilateral salpingectomy;  Surgeon: Molli Posey, MD;  Location: [place];  Service: Gynecology;  Laterality: Bilateral;  . LAPAROSCOPIC OVARIAN CYSTECTOMY  2002  . TRANSTHORACIC ECHOCARDIOGRAM  09-25-2012   dr berry   normal perfusion study/  no ischemia/  normal LV function and wall motion, ef 63%  . VARICOSE VEIN SURGERY  06-07-2011   Laser right greater SV    Social History   Socioeconomic History  . Marital status: Married    Spouse name: Not on file  . Number of children: 1  . Years of education: Not on file  . Highest education level: Not on file  Social Needs  . Financial resource strain: Not on file  . Food  insecurity - worry: Not on file  . Food insecurity - inability: Not on file  . Transportation needs - medical: Not on file  . Transportation needs - non-medical: Not on file  Occupational History  . Not on file  Tobacco Use  . Smoking status: Current Some Day Smoker    Packs/day: 0.10    Years: 10.00    Pack years: 1.00    Types: Cigarettes  . Smokeless tobacco: Never Used  . Tobacco comment: 3-4 cig per day  Substance and Sexual Activity  . Alcohol use: No    Comment: occasional  . Drug use: No  . Sexual activity: Yes    Birth control/protection: None  Other Topics  Concern  . Not on file  Social History Narrative  . Not on file    Family History  Problem Relation Age of Onset  . Ulcers Father   . Ulcers Son   . Colitis Son      Review of Systems  Constitutional: Negative.  Negative for chills, fever and malaise/fatigue.  HENT: Negative.  Negative for sore throat.        Able to swallow well.  Eyes: Negative.   Respiratory: Negative.  Negative for cough and shortness of breath.   Cardiovascular: Negative.  Negative for chest pain and palpitations.  Gastrointestinal: Negative for abdominal pain, diarrhea, nausea and vomiting.  Genitourinary: Negative.  Negative for dysuria and hematuria.  Musculoskeletal: Negative for myalgias and neck pain.  Skin: Negative for rash.  Neurological: Negative.  Negative for dizziness and headaches.  Endo/Heme/Allergies: Negative.   All other systems reviewed and are negative.  Vitals:   08/02/17 1340  BP: 123/83  Pulse: 78  Resp: 16  Temp: 98.3 F (36.8 C)  SpO2: 98%     Physical Exam  Constitutional: She is oriented to person, place, and time. She appears well-developed and well-nourished.  HENT:  Head: Normocephalic and atraumatic.  Nose: Nose normal.  Mouth/Throat: Oropharynx is clear and moist.  Eyes: Conjunctivae and EOM are normal. Pupils are equal, round, and reactive to light.  Neck: Normal range of motion. Neck supple. No JVD present. No thyromegaly present.  Left-sided lower neck tenderness, suspected thyroiditis.  Cardiovascular: Normal rate and regular rhythm.  Pulmonary/Chest: Effort normal and breath sounds normal.  Musculoskeletal: Normal range of motion.  Lymphadenopathy:    She has no cervical adenopathy.  Neurological: She is alert and oriented to person, place, and time.  Skin: Skin is warm and dry. Capillary refill takes less than 2 seconds. No rash noted.  Psychiatric: She has a normal mood and affect. Her behavior is normal.  Vitals reviewed.   Results for orders  placed or performed in visit on 07/31/17 (from the past 72 hour(s))  CBC with Differential/Platelet     Status: Abnormal   Collection Time: 07/31/17  6:06 PM  Result Value Ref Range   WBC 11.8 (H) 3.4 - 10.8 x10E3/uL   RBC 4.58 3.77 - 5.28 x10E6/uL   Hemoglobin 14.3 11.1 - 15.9 g/dL   Hematocrit 41.3 34.0 - 46.6 %   MCV 90 79 - 97 fL   MCH 31.2 26.6 - 33.0 pg   MCHC 34.6 31.5 - 35.7 g/dL   RDW 12.3 12.3 - 15.4 %   Platelets 280 150 - 379 x10E3/uL   Neutrophils 65 Not Estab. %   Lymphs 28 Not Estab. %   Monocytes 4 Not Estab. %   Eos 3 Not Estab. %   Basos 0 Not  Estab. %   Neutrophils Absolute 7.5 (H) 1.4 - 7.0 x10E3/uL   Lymphocytes Absolute 3.3 (H) 0.7 - 3.1 x10E3/uL   Monocytes Absolute 0.5 0.1 - 0.9 x10E3/uL   EOS (ABSOLUTE) 0.4 0.0 - 0.4 x10E3/uL   Basophils Absolute 0.1 0.0 - 0.2 x10E3/uL   Immature Granulocytes 0 Not Estab. %   Immature Grans (Abs) 0.0 0.0 - 0.1 x10E3/uL  Comprehensive metabolic panel     Status: None   Collection Time: 07/31/17  6:06 PM  Result Value Ref Range   Glucose 95 65 - 99 mg/dL   BUN 9 6 - 24 mg/dL   Creatinine, Ser 0.70 0.57 - 1.00 mg/dL   GFR calc non Af Amer 106 >59 mL/min/1.73   GFR calc Af Amer 122 >59 mL/min/1.73   BUN/Creatinine Ratio 13 9 - 23   Sodium 140 134 - 144 mmol/L   Potassium 4.5 3.5 - 5.2 mmol/L   Chloride 103 96 - 106 mmol/L   CO2 25 20 - 29 mmol/L   Calcium 9.6 8.7 - 10.2 mg/dL   Total Protein 7.3 6.0 - 8.5 g/dL   Albumin 4.9 3.5 - 5.5 g/dL   Globulin, Total 2.4 1.5 - 4.5 g/dL   Albumin/Globulin Ratio 2.0 1.2 - 2.2   Bilirubin Total 0.3 0.0 - 1.2 mg/dL   Alkaline Phosphatase 49 39 - 117 IU/L   AST 12 0 - 40 IU/L   ALT 16 0 - 32 IU/L  Thyroid Profile     Status: Abnormal   Collection Time: 07/31/17  6:06 PM  Result Value Ref Range   T4, Total 7.5 4.5 - 12.0 ug/dL   T3 Uptake Ratio 21 (L) 24 - 39 %   Free Thyroxine Index 1.6 1.2 - 4.9   Results reviewed with patient in the room.  ASSESSMENT & PLAN: Taylor Knight was  seen today for thyroid problem.  Diagnoses and all orders for this visit:  Neck pain  Acute thyroiditis   Continue present medications.  Follow-up early next week.  May need a soft tissue of the neck ultrasound if not significantly better.   Agustina Caroli, MD [facility]

## 2017-08-06 ENCOUNTER — Telehealth: Payer: Self-pay

## 2017-08-06 ENCOUNTER — Other Ambulatory Visit: Payer: Self-pay | Admitting: Emergency Medicine

## 2017-08-06 DIAGNOSIS — M542 Cervicalgia: Secondary | ICD-10-CM

## 2017-08-06 NOTE — Telephone Encounter (Signed)
Copied from [place]. Topic: Quick Communication - See Telephone Encounter >> Aug 05, 2017  3:20 PM Antonieta Iba C wrote: CRM for notification. See Telephone encounter for: pt says that she was seen and was advised that provider could schedule a [place] if needed. Pt says that she would like for provider to go ahead with the scheduling of the [place] because it feels like something is still stuck in her throat.   Please advise  CB: [id number] (work, until 5)   08/05/17.

## 2017-08-06 NOTE — Telephone Encounter (Signed)
Order placed for soft tissue neck ultrasound. Thanks.

## 2017-08-11 ENCOUNTER — Emergency Department (HOSPITAL_COMMUNITY): Payer: BLUE CROSS/BLUE SHIELD

## 2017-08-11 ENCOUNTER — Encounter (HOSPITAL_COMMUNITY): Payer: Self-pay

## 2017-08-11 ENCOUNTER — Inpatient Hospital Stay (HOSPITAL_COMMUNITY)
Admission: EM | Admit: 2017-08-11 | Discharge: 2017-08-13 | DRG: 546 | Disposition: A | Discharge status: Home / Self Care | Payer: BLUE CROSS/BLUE SHIELD | Attending: Internal Medicine | Admitting: Internal Medicine

## 2017-08-11 ENCOUNTER — Other Ambulatory Visit: Payer: Self-pay

## 2017-08-11 DIAGNOSIS — R1110 Vomiting, unspecified: Secondary | ICD-10-CM | POA: Diagnosis present

## 2017-08-11 DIAGNOSIS — F418 Other specified anxiety disorders: Secondary | ICD-10-CM | POA: Diagnosis present

## 2017-08-11 DIAGNOSIS — Z23 Encounter for immunization: Secondary | ICD-10-CM | POA: Diagnosis not present

## 2017-08-11 DIAGNOSIS — Z880 Allergy status to penicillin: Secondary | ICD-10-CM

## 2017-08-11 DIAGNOSIS — R778 Other specified abnormalities of plasma proteins: Secondary | ICD-10-CM

## 2017-08-11 DIAGNOSIS — Z9079 Acquired absence of other genital organ(s): Secondary | ICD-10-CM

## 2017-08-11 DIAGNOSIS — M542 Cervicalgia: Secondary | ICD-10-CM | POA: Diagnosis present

## 2017-08-11 DIAGNOSIS — E876 Hypokalemia: Secondary | ICD-10-CM | POA: Diagnosis present

## 2017-08-11 DIAGNOSIS — I776 Arteritis, unspecified: Principal | ICD-10-CM

## 2017-08-11 DIAGNOSIS — I959 Hypotension, unspecified: Secondary | ICD-10-CM | POA: Diagnosis present

## 2017-08-11 DIAGNOSIS — E860 Dehydration: Secondary | ICD-10-CM | POA: Diagnosis present

## 2017-08-11 DIAGNOSIS — Z87820 Personal history of traumatic brain injury: Secondary | ICD-10-CM

## 2017-08-11 DIAGNOSIS — F17210 Nicotine dependence, cigarettes, uncomplicated: Secondary | ICD-10-CM | POA: Diagnosis present

## 2017-08-11 DIAGNOSIS — Z79899 Other long term (current) drug therapy: Secondary | ICD-10-CM

## 2017-08-11 DIAGNOSIS — R112 Nausea with vomiting, unspecified: Secondary | ICD-10-CM | POA: Diagnosis present

## 2017-08-11 DIAGNOSIS — R748 Abnormal levels of other serum enzymes: Secondary | ICD-10-CM | POA: Diagnosis present

## 2017-08-11 DIAGNOSIS — Z90710 Acquired absence of both cervix and uterus: Secondary | ICD-10-CM

## 2017-08-11 DIAGNOSIS — R51 Headache: Secondary | ICD-10-CM | POA: Diagnosis not present

## 2017-08-11 DIAGNOSIS — N179 Acute kidney failure, unspecified: Secondary | ICD-10-CM | POA: Diagnosis present

## 2017-08-11 DIAGNOSIS — G9001 Carotid sinus syncope: Secondary | ICD-10-CM

## 2017-08-11 DIAGNOSIS — R7989 Other specified abnormal findings of blood chemistry: Secondary | ICD-10-CM

## 2017-08-11 LAB — CBC WITH DIFFERENTIAL/PLATELET
Basophils Absolute: 0 10*3/uL (ref 0.0–0.1)
Basophils Relative: 0 %
Eosinophils Absolute: 0.3 10*3/uL (ref 0.0–0.7)
Eosinophils Relative: 1 %
HCT: 48 % — ABNORMAL HIGH (ref 36.0–46.0)
Hemoglobin: 16.7 g/dL — ABNORMAL HIGH (ref 12.0–15.0)
Lymphocytes Relative: 11 %
Lymphs Abs: 2.5 10*3/uL (ref 0.7–4.0)
MCH: 32.1 pg (ref 26.0–34.0)
MCHC: 34.8 g/dL (ref 30.0–36.0)
MCV: 92.3 fL (ref 78.0–100.0)
Monocytes Absolute: 0.5 10*3/uL (ref 0.1–1.0)
Monocytes Relative: 2 %
Neutro Abs: 20.1 10*3/uL — ABNORMAL HIGH (ref 1.7–7.7)
Neutrophils Relative %: 86 %
Platelets: 335 10*3/uL (ref 150–400)
RBC: 5.2 MIL/uL — ABNORMAL HIGH (ref 3.87–5.11)
RDW: 12.3 % (ref 11.5–15.5)
WBC: 23.5 10*3/uL — ABNORMAL HIGH (ref 4.0–10.5)

## 2017-08-11 LAB — URINALYSIS, ROUTINE W REFLEX MICROSCOPIC
Bilirubin Urine: NEGATIVE
Glucose, UA: NEGATIVE mg/dL
Ketones, ur: 5 mg/dL — AB
Leukocytes, UA: NEGATIVE
Nitrite: NEGATIVE
Protein, ur: NEGATIVE mg/dL
Specific Gravity, Urine: 1.032 — ABNORMAL HIGH (ref 1.005–1.030)
pH: 7 (ref 5.0–8.0)

## 2017-08-11 LAB — COMPREHENSIVE METABOLIC PANEL
ALT: 26 U/L (ref 14–54)
AST: 25 U/L (ref 15–41)
Albumin: 4.2 g/dL (ref 3.5–5.0)
Alkaline Phosphatase: 43 U/L (ref 38–126)
Anion gap: 11 (ref 5–15)
BUN: 21 mg/dL — ABNORMAL HIGH (ref 6–20)
CO2: 24 mmol/L (ref 22–32)
Calcium: 8.7 mg/dL — ABNORMAL LOW (ref 8.9–10.3)
Chloride: 107 mmol/L (ref 101–111)
Creatinine, Ser: 1.29 mg/dL — ABNORMAL HIGH (ref 0.44–1.00)
GFR calc Af Amer: 57 mL/min — ABNORMAL LOW (ref >60)
GFR calc non Af Amer: 50 mL/min — ABNORMAL LOW (ref >60)
Glucose, Bld: 127 mg/dL — ABNORMAL HIGH (ref 65–99)
Potassium: 3.2 mmol/L — ABNORMAL LOW (ref 3.5–5.1)
Sodium: 142 mmol/L (ref 135–145)
Total Bilirubin: 0.2 mg/dL — ABNORMAL LOW (ref 0.3–1.2)
Total Protein: 6.9 g/dL (ref 6.5–8.1)

## 2017-08-11 LAB — TROPONIN I
Troponin I: 0.03 ng/mL (ref <0.03)
Troponin I: 0.05 ng/mL (ref <0.03)
Troponin I: <0.03 ng/mL (ref <0.03)
Troponin I: <0.03 ng/mL (ref <0.03)

## 2017-08-11 LAB — BRAIN NATRIURETIC PEPTIDE: B Natriuretic Peptide: 21.2 pg/mL (ref 0.0–100.0)

## 2017-08-11 LAB — TSH: TSH: 5.262 u[IU]/mL — ABNORMAL HIGH (ref 0.350–4.500)

## 2017-08-11 MED ORDER — ONDANSETRON HCL 4 MG/2ML IJ SOLN
4.0000 mg | Freq: Once | INTRAMUSCULAR | Status: AC
  Administered 2017-08-11: 4 mg via INTRAVENOUS
  Filled 2017-08-11: qty 2

## 2017-08-11 MED ORDER — SODIUM CHLORIDE 0.9 % IV BOLUS (SEPSIS)
1000.0000 mL | Freq: Once | INTRAVENOUS | Status: AC
  Administered 2017-08-11: 1000 mL via INTRAVENOUS

## 2017-08-11 MED ORDER — METHYLPREDNISOLONE SODIUM SUCC 125 MG IJ SOLR
125.0000 mg | Freq: Once | INTRAMUSCULAR | Status: AC
  Administered 2017-08-11: 125 mg via INTRAVENOUS
  Filled 2017-08-11: qty 2

## 2017-08-11 MED ORDER — ENOXAPARIN SODIUM 40 MG/0.4ML ~~LOC~~ SOLN
40.0000 mg | SUBCUTANEOUS | Status: DC
  Administered 2017-08-11: 40 mg via SUBCUTANEOUS
  Filled 2017-08-11 (×2): qty 0.4

## 2017-08-11 MED ORDER — ACETAMINOPHEN 325 MG PO TABS
650.0000 mg | ORAL_TABLET | Freq: Four times a day (QID) | ORAL | Status: DC | PRN
  Administered 2017-08-11: 650 mg via ORAL
  Filled 2017-08-11: qty 2

## 2017-08-11 MED ORDER — SODIUM CHLORIDE 0.9 % IJ SOLN
INTRAMUSCULAR | Status: AC
  Administered 2017-08-11: 10:00:00
  Filled 2017-08-11: qty 50

## 2017-08-11 MED ORDER — SODIUM CHLORIDE 0.9 % IV SOLN
INTRAVENOUS | Status: DC
  Administered 2017-08-11 – 2017-08-13 (×5): via INTRAVENOUS

## 2017-08-11 MED ORDER — PANTOPRAZOLE SODIUM 40 MG IV SOLR
40.0000 mg | INTRAVENOUS | Status: DC
  Administered 2017-08-11 – 2017-08-13 (×3): 40 mg via INTRAVENOUS
  Filled 2017-08-11 (×3): qty 40

## 2017-08-11 MED ORDER — SODIUM CHLORIDE 0.9 % IV SOLN
INTRAVENOUS | Status: DC
  Administered 2017-08-11: 09:00:00 via INTRAVENOUS

## 2017-08-11 MED ORDER — INFLUENZA VAC SPLIT QUAD 0.5 ML IM SUSY
0.5000 mL | PREFILLED_SYRINGE | INTRAMUSCULAR | Status: AC
  Administered 2017-08-13: 0.5 mL via INTRAMUSCULAR
  Filled 2017-08-11: qty 0.5

## 2017-08-11 MED ORDER — IOPAMIDOL (ISOVUE-300) INJECTION 61%
INTRAVENOUS | Status: AC
  Administered 2017-08-11: 100 mL
  Filled 2017-08-11: qty 100

## 2017-08-11 MED ORDER — ONDANSETRON HCL 4 MG PO TABS: 4.0000 mg | ORAL_TABLET | Freq: Four times a day (QID) | ORAL | Status: DC | PRN

## 2017-08-11 MED ORDER — ONDANSETRON HCL 4 MG/2ML IJ SOLN
4.0000 mg | Freq: Four times a day (QID) | INTRAMUSCULAR | Status: DC | PRN
Start: 2017-08-11 — End: 2017-08-13

## 2017-08-11 MED ORDER — TRAMADOL HCL 50 MG PO TABS
50.0000 mg | ORAL_TABLET | Freq: Four times a day (QID) | ORAL | Status: DC | PRN
  Administered 2017-08-11: 50 mg via ORAL
  Filled 2017-08-11: qty 1

## 2017-08-11 MED ORDER — PREDNISONE 50 MG PO TABS
60.0000 mg | ORAL_TABLET | Freq: Every day | ORAL | Status: DC
  Administered 2017-08-12 – 2017-08-13 (×2): 60 mg via ORAL
  Filled 2017-08-11 (×2): qty 1

## 2017-08-11 MED ORDER — ACETAMINOPHEN 650 MG RE SUPP: 650.0000 mg | Freq: Four times a day (QID) | RECTAL | Status: DC | PRN

## 2017-08-11 NOTE — ED Provider Notes (Signed)
[facility] DEPT Provider Note   CSN: [id number] Arrival date & time: 08/11/17  0700     History   Chief Complaint No chief complaint on file.   HPI Taylor Knight is a 47 y.o. female.  Pt presents to the ED today with n/v/d.  The pt got up from bed to have n/v/d and felt like she was going to pass out.  The pt sat down and called EMS.  Initial BP 60/35.  They started an IV and gave her 800 cc of NS.  BP much improved.  Pt still feels nauseous.  She denies any pain.  Pt said she's been having left sided neck pain for about a month.  She did see her doctor who suspected thyroiditis.  He put her on zithromax.  She is scheduled for thyroid US, but has not yet gotten it.  She also c/o lower abd cramping.      Past Medical History:  Diagnosis Date  . Anxiety   . Depression   . H. pylori infection   . History of concussion    2013  . History of Mallory-Weiss syndrome   . History of panic attacks   . History of seizure    05-01-2012--  idiology, xanax withdrawal seizure  . Severe dysmenorrhea   . Varicose veins    bilateral legs    Patient Active Problem List   Diagnosis Date Noted  . Hypotension 08/11/2017  . Neck pain 07/31/2017  . Acute thyroiditis 07/31/2017  . Pelvic pain in female 07/05/2015  . Syncope 05/11/2012  . Altered mental state 05/11/2012  . Seizure ([place]) 04/30/2012  . Depression with anxiety 03/24/2012  . Varicose veins of lower extremities with other complications [id number]/81/0175    Past Surgical History:  Procedure Laterality Date  . BUNIONECTOMY Bilateral 2009  . CARDIOVASCULAR STRESS TEST  09-25-2012   normal nuclear study/  no ischemia/  normal LV function and wall motion , ef 63%  . LAPAROSCOPIC ASSISTED VAGINAL HYSTERECTOMY Bilateral 07/05/2015   Procedure: LAPAROSCOPIC ASSISTED VAGINAL HYSTERECTOMY, bilateral salpingectomy;  Surgeon: Molli Posey, MD;  Location: [facility];  Service: Gynecology;   Laterality: Bilateral;  . LAPAROSCOPIC OVARIAN CYSTECTOMY  2002  . TRANSTHORACIC ECHOCARDIOGRAM  09-25-2012   dr berry   normal perfusion study/  no ischemia/  normal LV function and wall motion, ef 63%  . VARICOSE VEIN SURGERY  06-07-2011   Laser right greater SV    OB History    No data available       Home Medications    Prior to Admission medications   Medication Sig Start Date End Date Taking? Authorizing Provider  ibuprofen (ADVIL,MOTRIN) 200 MG tablet Take 800 mg by mouth every 6 (six) hours as needed for moderate pain.   Yes [provider]  azithromycin (ZITHROMAX) 250 MG tablet Sig as indicated Patient not taking: Reported on 08/11/2017 07/31/17   Horald Pollen, MD  ibuprofen (ADVIL,MOTRIN) 800 MG tablet Take 1 tablet (800 mg total) by mouth every 8 (eight) hours as needed for moderate pain (mild pain). Patient not taking: Reported on 08/11/2017 07/06/15   Molli Posey, MD  linaclotide Rolan Lipa) 290 MCG CAPS capsule TAKE ONE CAPSULE BY MOUTH DAILY BEFORE BREAKFAST Patient not taking: Reported on 08/02/2017 05/20/17   Doran Stabler, MD    Family History Family History  Problem Relation Age of Onset  . Ulcers Father   . Ulcers Son   . Colitis Son  Social History Social History   Tobacco Use  . Smoking status: Current Some Day Smoker    Packs/day: 0.10    Years: 10.00    Pack years: 1.00    Types: Cigarettes  . Smokeless tobacco: Never Used  . Tobacco comment: 3-4 cig per day  Substance Use Topics  . Alcohol use: No    Comment: occasional  . Drug use: No     Allergies   Penicillins   Review of Systems Review of Systems  Gastrointestinal: Positive for diarrhea, nausea and vomiting.  All other systems reviewed and are negative.    Physical Exam Updated Vital Signs BP 92/62 (BP Location: Right Arm)   Pulse 66   Resp 15   LMP 06/09/2015 (Exact Date)   SpO2 99%   Physical Exam  Constitutional: She is oriented to person,  place, and time. She appears well-developed and well-nourished.  HENT:  Head: Normocephalic and atraumatic.  Right Ear: External ear normal.  Left Ear: External ear normal.  Nose: Nose normal.  Mouth/Throat: Mucous membranes are dry.  Eyes: Conjunctivae and EOM are normal. Pupils are equal, round, and reactive to light.  Neck: Normal range of motion. Neck supple.  No mass felt.  Thyroid feels normal.  No current left neck tenderness.  Cardiovascular: Normal rate, regular rhythm, normal heart sounds and intact distal pulses.  Pulmonary/Chest: Effort normal and breath sounds normal.  Abdominal: Soft. Bowel sounds are normal. There is tenderness in the left lower quadrant.  Musculoskeletal: Normal range of motion.  Neurological: She is alert and oriented to person, place, and time.  Skin: Skin is warm. Capillary refill takes less than 2 seconds.  Psychiatric: She has a normal mood and affect. Her behavior is normal. Judgment and thought content normal.  Nursing note and vitals reviewed.    ED Treatments / Results  Labs (all labs ordered are listed, but only abnormal results are displayed) Labs Reviewed  CBC WITH DIFFERENTIAL/PLATELET - Abnormal; Notable for the following components:      Result Value   WBC 23.5 (*)    RBC 5.20 (*)    Hemoglobin 16.7 (*)    HCT 48.0 (*)    Neutro Abs 20.1 (*)    All other components within normal limits  COMPREHENSIVE METABOLIC PANEL - Abnormal; Notable for the following components:   Potassium 3.2 (*)    Glucose, Bld 127 (*)    BUN 21 (*)    Creatinine, Ser 1.29 (*)    Calcium 8.7 (*)    Total Bilirubin 0.2 (*)    GFR calc non Af Amer 50 (*)    GFR calc Af Amer 57 (*)    All other components within normal limits  TROPONIN I - Abnormal; Notable for the following components:   Troponin I 0.03 (*)    All other components within normal limits  TSH - Abnormal; Notable for the following components:   TSH 5.262 (*)    All other components within  normal limits  TROPONIN I - Abnormal; Notable for the following components:   Troponin I 0.05 (*)    All other components within normal limits  URINALYSIS, ROUTINE W REFLEX MICROSCOPIC  I-STAT CG4 LACTIC ACID, ED    EKG  EKG Interpretation  Date/Time:  Sunday August 11 2017 07:47:40 EDT Ventricular Rate:  73 PR Interval:    QRS Duration: 107 QT Interval:  424 QTC Calculation: 468 R Axis:   81 Text Interpretation:  Sinus rhythm Confirmed by Gilford Raid,  Almyra Free [phone]) on 08/11/2017 7:51:56 AM       Radiology Ct Soft Tissue Neck W Contrast  Result Date: 08/11/2017 CLINICAL DATA:  Suspected thyroiditis. Recent left-sided neck pain. Hypotension. EXAM: CT NECK WITH CONTRAST TECHNIQUE: Multidetector CT imaging of the neck was performed using the standard protocol following the bolus administration of intravenous contrast. CONTRAST:  151mL ISOVUE-300 IOPAMIDOL (ISOVUE-300) INJECTION 61% COMPARISON:  Cervical spine CT 05/10/2012 FINDINGS: Pharynx and larynx: Symmetric pharyngeal soft tissues without evidence of mass or swelling. No parapharyngeal or retropharyngeal fluid collection or significant inflammatory changes. Unremarkable larynx. Salivary glands: No inflammation, mass, or stone. Thyroid: Unremarkable. Lymph nodes: No enlarged or suspicious lymph nodes in the neck. Vascular: Major vascular structures of the neck are patent. There is eccentric wall thickening involving an approximately 2 cm long portion of the mid left common carotid artery with infiltrative changes in the surrounding fat. The common carotid artery lumen is only slightly narrowed in this region by low-density material which may reflect fibrofatty plaque. No discrete dissection is apparent. The carotid bifurcation and cervical ICA are unremarkable. Limited intracranial: Unremarkable. Visualized orbits: Unremarkable. Mastoids and visualized paranasal sinuses: Moderate left greater than right ethmoid air cell and mild bilateral  frontal and maxillary sinus mucosal thickening. Clear mastoid air cells. Skeleton: No acute osseous abnormality or suspicious osseous lesion. Upper chest: Clear lung apices. Other: None. IMPRESSION: 1. Segmental wall thickening and surrounding inflammation involving the mid left common carotid artery suspicious for vasculitis. 2. Unremarkable CT appearance of the thyroid, although this would be more fully evaluated with ultrasound. Electronically Signed   By: Logan Bores M.D.   On: 08/11/2017 10:21   Ct Abdomen Pelvis W Contrast  Result Date: 08/11/2017 CLINICAL DATA:  Low blood pressure. Nausea, vomiting and diarrhea. Abdominal pain, diverticulitis suspected. EXAM: CT ABDOMEN AND PELVIS WITH CONTRAST TECHNIQUE: Multidetector CT imaging of the abdomen and pelvis was performed using the standard protocol following bolus administration of intravenous contrast. CONTRAST:  137mL ISOVUE-300 IOPAMIDOL (ISOVUE-300) INJECTION 61% COMPARISON:  CT abdomen dated 08/07/2016 FINDINGS: Lower chest: No acute abnormality. Hepatobiliary: No focal liver abnormality is seen. No gallstones, gallbladder wall thickening, or biliary dilatation. Pancreas: Unremarkable. No pancreatic ductal dilatation or surrounding inflammatory changes. Spleen: Normal in size without focal abnormality. Adrenals/Urinary Tract: Adrenal glands appear normal. Kidneys are unremarkable without suspicious mass, stone or hydronephrosis. Small left renal cyst. No obstructing ureteral calculi. Bladder is partially decompressed limiting characterization of its walls, perhaps mild bladder wall thickening circumferentially. Stomach/Bowel: Bowel is normal in caliber. No bowel wall thickening or evidence of bowel wall inflammation seen. Appendix is normal. Stomach is unremarkable. Vascular/Lymphatic: Aortic atherosclerosis. No enlarged abdominal or pelvic lymph nodes. Reproductive: Status post hysterectomy. No adnexal masses. Small amount of free fluid in the lower  pelvis is likely physiologic in nature. Other: No abscess collection.  No free intraperitoneal air. Musculoskeletal: No acute or significant osseous findings. IMPRESSION: 1. No acute findings in the abdomen or pelvis. No bowel obstruction or evidence of bowel wall inflammation seen. No evidence of acute solid organ abnormality. No renal or ureteral calculi. Appendix is normal. 2. Bladder walls appear mildly prominent/thickened circumferentially, but this is likely related to the partial bladder decompression, less likely cystitis. Recommend correlation with urinalysis and/or clinical presentation. 3. Aortic atherosclerosis. Electronically Signed   By: Franki Cabot M.D.   On: 08/11/2017 10:07   Dg Chest Port 1 View  Result Date: 08/11/2017 CLINICAL DATA:  Syncope. EXAM: PORTABLE CHEST 1 VIEW COMPARISON:  09/18/2012 FINDINGS:  Numerous leads and wires project over the chest. Midline trachea. Normal heart size and mediastinal contours. No pleural effusion or pneumothorax. Clear lungs. IMPRESSION: No acute cardiopulmonary disease. Electronically Signed   By: Abigail Miyamoto M.D.   On: 08/11/2017 08:04    Procedures Procedures (including critical care time)  Medications Ordered in ED Medications  sodium chloride 0.9 % bolus 1,000 mL (0 mLs Intravenous Stopped 08/11/17 0859)    And  0.9 %  sodium chloride infusion ( Intravenous New Bag/Given 08/11/17 0845)  ondansetron (ZOFRAN) injection 4 mg (4 mg Intravenous Given 08/11/17 0814)  iopamidol (ISOVUE-300) 61 % injection (100 mLs  Contrast Given 08/11/17 0933)  sodium chloride 0.9 % injection (  Given 08/11/17 0957)  sodium chloride 0.9 % bolus 1,000 mL (1,000 mLs Intravenous New Bag/Given 08/11/17 1109)  methylPREDNISolone sodium succinate (SOLU-MEDROL) 125 mg/2 mL injection 125 mg (125 mg Intravenous Given 08/11/17 1236)  sodium chloride 0.9 % bolus 1,000 mL (1,000 mLs Intravenous New Bag/Given 08/11/17 1235)     Initial Impression / Assessment and Plan / ED  Course  I have reviewed the triage vital signs and the nursing notes.  Pertinent labs & imaging results that were available during my care of the patient were reviewed by me and considered in my medical decision making (see chart for details).    I did speak with Dr. Donnetta Hutching as pt has possible vasculitis of left carotid artery.  He recommends pt f/u with rheumatology as that is not his expertise.    Pt given solumedrol 125 mg for the vasculitis.  Pt's bp has remained in the 90s.  She reports her bp is normally in the 120.  She looks and feels much better.  Troponin is mildly elevated which is likely due to dehydration causing mild aki, but she is not back to her baseline after several hours of treatment.     CRITICAL CARE Performed by: Isla Pence   Total critical care time: 30 minutes  Critical care time was exclusive of separately billable procedures and treating other patients.  Critical care was necessary to treat or prevent imminent or life-threatening deterioration.  Critical care was time spent personally by me on the following activities: development of treatment plan with patient and/or surrogate as well as nursing, discussions with consultants, evaluation of patient's response to treatment, examination of patient, obtaining history from patient or surrogate, ordering and performing treatments and interventions, ordering and review of laboratory studies, ordering and review of radiographic studies, pulse oximetry and re-evaluation of patient's condition.  Pt d/w Dr. Karleen Hampshire (triad) for admission.  Final Clinical Impressions(s) / ED Diagnoses   Final diagnoses:  Vasculitis ([place])  Carotidynia  Hypotension, unspecified hypotension type  Elevated troponin    ED Discharge Orders    None       Isla Pence, MD 08/11/17 1250

## 2017-08-11 NOTE — ED Notes (Signed)
ED TO INPATIENT HANDOFF REPORT  Name/Age/Gender Taylor Knight 47 y.o. female  Code Status Code Status History    Date Active Date Inactive Code Status Order ID Comments User Context   07/05/2015 09:47 07/06/2015 17:16 Full Code [id number]  Molli Posey, MD Inpatient   05/01/2012 01:26 05/01/2012 16:17 Full Code [id number]  Clemens Catholic, RN Inpatient      Home/SNF/Other Home  Chief Complaint hypotension  Level of Care/Admitting Diagnosis ED Disposition    ED Disposition Condition [facility] Area: [facility] [[id number]]  Level of Care: Telemetry [5]  Admit to tele based on following criteria: Complex arrhythmia (Bradycardia/Tachycardia)  Diagnosis: Hypotension [023343]  Admitting Physician: Hosie Poisson [[id number]]  Attending Physician: Hosie Poisson [[id number]]  Estimated length of stay: past midnight tomorrow  Certification:: I certify this patient will need inpatient services for at least 2 midnights  PT Class (Do Not Modify): Inpatient [101]  PT Acc Code (Do Not Modify): Private [1]       Medical History Past Medical History:  Diagnosis Date  . Anxiety   . Depression   . H. pylori infection   . History of concussion    2013  . History of Mallory-Weiss syndrome   . History of panic attacks   . History of seizure    05-01-2012--  idiology, xanax withdrawal seizure  . Severe dysmenorrhea   . Varicose veins    bilateral legs    Allergies Allergies  Allergen Reactions  . Penicillins Hives    Has patient had a PCN reaction causing immediate rash, facial/tongue/throat swelling, SOB or lightheadedness with hypotension: Yes Has patient had a PCN reaction causing severe rash involving mucus membranes or skin necrosis: Yes Has patient had a PCN reaction that required hospitalization No Has patient had a PCN reaction occurring within the last 10 years: No  If all of the above answers are "NO", then may proceed with Cephalosporin use.      IV Location/Drains/Wounds Patient Lines/Drains/Airways Status   Active Line/Drains/Airways    Name:   Placement date:   Placement time:   Site:   Days:   Peripheral IV 08/11/17 Left Antecubital   08/11/17    -    Antecubital   less than 1   Urethral Catheter [name] Non-latex 14 Fr.   07/09/15    1000    Non-latex   764   Incision (Closed) 07/05/15 Abdomen   07/05/15    0850     768   Incision - 3 Ports   Umbilicus  Lower;Mid     07/05/15    0851     768   Wound 04/30/12 Other (Comment) Head Upper;Medial Three staples from pre-hospital fall.   04/30/12    -    Head   1929   Wound 05/10/12 Abrasion(s) Nose edema   05/10/12    0702    Nose   1919          Labs/Imaging Results for orders placed or performed during the hospital encounter of 08/11/17 (from the past 48 hour(s))  CBC WITH DIFFERENTIAL     Status: Abnormal   Collection Time: 08/11/17  7:53 AM  Result Value Ref Range   WBC 23.5 (H) 4.0 - 10.5 K/uL   RBC 5.20 (H) 3.87 - 5.11 MIL/uL   Hemoglobin 16.7 (H) 12.0 - 15.0 g/dL   HCT 48.0 (H) 36.0 - 46.0 %   MCV 92.3 78.0 - 100.0 fL  MCH 32.1 26.0 - 34.0 pg   MCHC 34.8 30.0 - 36.0 g/dL   RDW 12.3 11.5 - 15.5 %   Platelets 335 150 - 400 K/uL   Neutrophils Relative % 86 %   Neutro Abs 20.1 (H) 1.7 - 7.7 K/uL   Lymphocytes Relative 11 %   Lymphs Abs 2.5 0.7 - 4.0 K/uL   Monocytes Relative 2 %   Monocytes Absolute 0.5 0.1 - 1.0 K/uL   Eosinophils Relative 1 %   Eosinophils Absolute 0.3 0.0 - 0.7 K/uL   Basophils Relative 0 %   Basophils Absolute 0.0 0.0 - 0.1 K/uL    Comment: Performed at [facility], [place] [address] [place], [place] [address]  Comprehensive metabolic panel     Status: Abnormal   Collection Time: 08/11/17  7:53 AM  Result Value Ref Range   Sodium 142 135 - 145 mmol/L   Potassium 3.2 (L) 3.5 - 5.1 mmol/L   Chloride 107 101 - 111 mmol/L   CO2 24 22 - 32 mmol/L   Glucose, Bld 127 (H) 65 - 99 mg/dL   BUN 21 (H) 6 - 20 mg/dL    Creatinine, Ser 1.29 (H) 0.44 - 1.00 mg/dL   Calcium 8.7 (L) 8.9 - 10.3 mg/dL   Total Protein 6.9 6.5 - 8.1 g/dL   Albumin 4.2 3.5 - 5.0 g/dL   AST 25 15 - 41 U/L   ALT 26 14 - 54 U/L   Alkaline Phosphatase 43 38 - 126 U/L   Total Bilirubin 0.2 (L) 0.3 - 1.2 mg/dL   GFR calc non Af Amer 50 (L) >60 mL/min   GFR calc Af Amer 57 (L) >60 mL/min    Comment: (NOTE) The eGFR has been calculated using the CKD EPI equation. This calculation has not been validated in all clinical situations. eGFR's persistently <60 mL/min signify possible Chronic Kidney Disease.    Anion gap 11 5 - 15    Comment: Performed at [facility], [place] [address] [place], [place] [address]  Troponin I     Status: Abnormal   Collection Time: 08/11/17  7:53 AM  Result Value Ref Range   Troponin I 0.03 (HH) <0.03 ng/mL    Comment: CRITICAL RESULT CALLED TO, READ BACK BY AND VERIFIED WITH: Jannette Fogo 08/11/17 0919 RHOLMES Performed at [facility], [place] [address], [place], [place] [address]   TSH     Status: Abnormal   Collection Time: 08/11/17  7:53 AM  Result Value Ref Range   TSH 5.262 (H) 0.350 - 4.500 uIU/mL    Comment: Performed by a 3rd Generation assay with a functional sensitivity of <=0.01 uIU/mL. Performed at [facility], [place] [address] [place], [place] [address]   Troponin I     Status: Abnormal   Collection Time: 08/11/17 11:16 AM  Result Value Ref Range   Troponin I 0.05 (HH) <0.03 ng/mL    Comment: CRITICAL VALUE NOTED.  VALUE IS CONSISTENT WITH PREVIOUSLY REPORTED AND CALLED VALUE. Performed at [facility], [place] [address] [place], [place] [address]    Ct Soft Tissue Neck W Contrast  Result Date: 08/11/2017 CLINICAL DATA:  Suspected thyroiditis. Recent left-sided neck pain. Hypotension. EXAM: CT NECK WITH CONTRAST TECHNIQUE: Multidetector CT imaging of the neck was performed using the standard protocol following the  bolus administration of intravenous contrast. CONTRAST:  127m ISOVUE-300 IOPAMIDOL (ISOVUE-300) INJECTION 61% COMPARISON:  Cervical spine CT 05/10/2012 FINDINGS: Pharynx  and larynx: Symmetric pharyngeal soft tissues without evidence of mass or swelling. No parapharyngeal or retropharyngeal fluid collection or significant inflammatory changes. Unremarkable larynx. Salivary glands: No inflammation, mass, or stone. Thyroid: Unremarkable. Lymph nodes: No enlarged or suspicious lymph nodes in the neck. Vascular: Major vascular structures of the neck are patent. There is eccentric wall thickening involving an approximately 2 cm long portion of the mid left common carotid artery with infiltrative changes in the surrounding fat. The common carotid artery lumen is only slightly narrowed in this region by low-density material which may reflect fibrofatty plaque. No discrete dissection is apparent. The carotid bifurcation and cervical ICA are unremarkable. Limited intracranial: Unremarkable. Visualized orbits: Unremarkable. Mastoids and visualized paranasal sinuses: Moderate left greater than right ethmoid air cell and mild bilateral frontal and maxillary sinus mucosal thickening. Clear mastoid air cells. Skeleton: No acute osseous abnormality or suspicious osseous lesion. Upper chest: Clear lung apices. Other: None. IMPRESSION: 1. Segmental wall thickening and surrounding inflammation involving the mid left common carotid artery suspicious for vasculitis. 2. Unremarkable CT appearance of the thyroid, although this would be more fully evaluated with ultrasound. Electronically Signed   By: Logan Bores M.D.   On: 08/11/2017 10:21   Ct Abdomen Pelvis W Contrast  Result Date: 08/11/2017 CLINICAL DATA:  Low blood pressure. Nausea, vomiting and diarrhea. Abdominal pain, diverticulitis suspected. EXAM: CT ABDOMEN AND PELVIS WITH CONTRAST TECHNIQUE: Multidetector CT imaging of the abdomen and pelvis was performed using the  standard protocol following bolus administration of intravenous contrast. CONTRAST:  18m ISOVUE-300 IOPAMIDOL (ISOVUE-300) INJECTION 61% COMPARISON:  CT abdomen dated 08/07/2016 FINDINGS: Lower chest: No acute abnormality. Hepatobiliary: No focal liver abnormality is seen. No gallstones, gallbladder wall thickening, or biliary dilatation. Pancreas: Unremarkable. No pancreatic ductal dilatation or surrounding inflammatory changes. Spleen: Normal in size without focal abnormality. Adrenals/Urinary Tract: Adrenal glands appear normal. Kidneys are unremarkable without suspicious mass, stone or hydronephrosis. Small left renal cyst. No obstructing ureteral calculi. Bladder is partially decompressed limiting characterization of its walls, perhaps mild bladder wall thickening circumferentially. Stomach/Bowel: Bowel is normal in caliber. No bowel wall thickening or evidence of bowel wall inflammation seen. Appendix is normal. Stomach is unremarkable. Vascular/Lymphatic: Aortic atherosclerosis. No enlarged abdominal or pelvic lymph nodes. Reproductive: Status post hysterectomy. No adnexal masses. Small amount of free fluid in the lower pelvis is likely physiologic in nature. Other: No abscess collection.  No free intraperitoneal air. Musculoskeletal: No acute or significant osseous findings. IMPRESSION: 1. No acute findings in the abdomen or pelvis. No bowel obstruction or evidence of bowel wall inflammation seen. No evidence of acute solid organ abnormality. No renal or ureteral calculi. Appendix is normal. 2. Bladder walls appear mildly prominent/thickened circumferentially, but this is likely related to the partial bladder decompression, less likely cystitis. Recommend correlation with urinalysis and/or clinical presentation. 3. Aortic atherosclerosis. Electronically Signed   By: SFranki CabotM.D.   On: 08/11/2017 10:07   Dg Chest Port 1 View  Result Date: 08/11/2017 CLINICAL DATA:  Syncope. EXAM: PORTABLE CHEST 1  VIEW COMPARISON:  09/18/2012 FINDINGS: Numerous leads and wires project over the chest. Midline trachea. Normal heart size and mediastinal contours. No pleural effusion or pneumothorax. Clear lungs. IMPRESSION: No acute cardiopulmonary disease. Electronically Signed   By: KAbigail MiyamotoM.D.   On: 08/11/2017 08:04    Pending Labs Unresulted Labs (From admission, onward)   Start     Ordered   08/11/17 0712  Urinalysis, Routine w reflex microscopic  STAT,  STAT     08/11/17 0712      Vitals/Pain Today's Vitals   08/11/17 1230 08/11/17 1245 08/11/17 1300 08/11/17 1315  BP: 103/61  100/65   Pulse:  76 73 67  Resp: _0 SpO2:  99% 100% 98%    Isolation Precautions No active isolations  Medications Medications  sodium chloride 0.9 % bolus 1,000 mL (0 mLs Intravenous Stopped 08/11/17 0859)    And  0.9 %  sodium chloride infusion ( Intravenous New Bag/Given 08/11/17 0845)  ondansetron (ZOFRAN) injection 4 mg (4 mg Intravenous Given 08/11/17 0814)  iopamidol (ISOVUE-300) 61 % injection (100 mLs  Contrast Given 08/11/17 0933)  sodium chloride 0.9 % injection (  Given 08/11/17 0957)  sodium chloride 0.9 % bolus 1,000 mL (0 mLs Intravenous Stopped 08/11/17 1324)  methylPREDNISolone sodium succinate (SOLU-MEDROL) 125 mg/2 mL injection 125 mg (125 mg Intravenous Given 08/11/17 1236)  sodium chloride 0.9 % bolus 1,000 mL (1,000 mLs Intravenous New Bag/Given 08/11/17 1235)    Mobility walks

## 2017-08-11 NOTE — ED Triage Notes (Signed)
Patient coming from home with c/o not feeling well. Pt c/o . First BP was 60/35 given bolus of 800 ml of saline per ems and last pressure 142/92. cbg 226. Pt also c/o nausea, vomiting and diarrhea at time.

## 2017-08-11 NOTE — H&P (Signed)
History and Physical    Taylor Knight QJJ:[id number] DOB: 1970/09/11 DOA: 08/11/2017  PCP: System, Pcp Not In  Patient coming from: Home.   I have personally briefly reviewed patient's old medical records in [place]  Chief Complaint: brought in EMS for hypotension, nausea and vomiting.  HPI: Taylor Knight is a 47 y.o. female with medical history significant of anxiety and depression, seizures reports waking up around 4 am today, with nausea, vomiting and diarrhea. After multiple episodes of vomiting and diarrhea, she felt weak and dizzy, felt like passing out. Her husband helped her to the living room and EMS called. When EMS arrived she was profoundly hypotensive and was brought to ED for further evaluation.  Her hypotension resolved with 3 lit fluid boluses. Pt reports she had left sided neck pain since 2 weeks, was seen at urgent care, was told she probably has thyroiditis and was given antibiotics and IBUPROFEN. She had completed antibiotics last Sunday, but has continued to use Ibuprofen every now and then for the left sided neck pain. She was supposed to get an US of the thyroid if her neck pain does not resolve.  Today she reports her neck pain is minimal, but she complains of a headache, bad taste in the mouth from vomiting, feels weak and nauseated. No fever or chills. abd pain is minimal. No sob, chest pain or cough.  No similar complaints in the past. None sick at home.   ED lab work revealed hypokalemia of 3.2, minimally elevated troponins of 0.03 and 0.05, wbc count of 23,000 and hemoglobin of 16.7. CT abd and pelvis was unremarkable.  CT soft tissue of the neck shows possible vasculitis of the  Mid part of left  Common carotid artery.  She was referred to medical service for evaluation of nausea, vomiting and hypotension.    Review of Systems: As per HPI otherwise  "All others reviewed and are negative,".  Past Medical History:  Diagnosis Date  . Anxiety   . Depression   .  H. pylori infection   . History of concussion    2013  . History of Mallory-Weiss syndrome   . History of panic attacks   . History of seizure    05-01-2012--  idiology, xanax withdrawal seizure  . Severe dysmenorrhea   . Varicose veins    bilateral legs    Past Surgical History:  Procedure Laterality Date  . BUNIONECTOMY Bilateral 2009  . CARDIOVASCULAR STRESS TEST  09-25-2012   normal nuclear study/  no ischemia/  normal LV function and wall motion , ef 63%  . LAPAROSCOPIC ASSISTED VAGINAL HYSTERECTOMY Bilateral 07/05/2015   Procedure: LAPAROSCOPIC ASSISTED VAGINAL HYSTERECTOMY, bilateral salpingectomy;  Surgeon: Molli Posey, MD;  Location: [facility];  Service: Gynecology;  Laterality: Bilateral;  . LAPAROSCOPIC OVARIAN CYSTECTOMY  2002  . TRANSTHORACIC ECHOCARDIOGRAM  09-25-2012   dr berry   normal perfusion study/  no ischemia/  normal LV function and wall motion, ef 63%  . VARICOSE VEIN SURGERY  06-07-2011   Laser right greater SV     reports that she has been smoking cigarettes.  She has a 1.00 pack-year smoking history. she has never used smokeless tobacco. She reports that she does not drink alcohol or use drugs.  Allergies  Allergen Reactions  . Penicillins Hives    Has patient had a PCN reaction causing immediate rash, facial/tongue/throat swelling, SOB or lightheadedness with hypotension: Yes Has patient had a PCN reaction causing severe rash involving  mucus membranes or skin necrosis: Yes Has patient had a PCN reaction that required hospitalization No Has patient had a PCN reaction occurring within the last 10 years: No  If all of the above answers are "NO", then may proceed with Cephalosporin use.     Family History  Problem Relation Age of Onset  . Ulcers Father   . Ulcers Son   . Colitis Son    Reviewed and pertinent.  Prior to Admission medications   Medication Sig Start Date End Date Taking? Authorizing Provider  ibuprofen  (ADVIL,MOTRIN) 200 MG tablet Take 800 mg by mouth every 6 (six) hours as needed for moderate pain.   Yes [provider]  azithromycin (ZITHROMAX) 250 MG tablet Sig as indicated Patient not taking: Reported on 08/11/2017 07/31/17   Horald Pollen, MD  ibuprofen (ADVIL,MOTRIN) 800 MG tablet Take 1 tablet (800 mg total) by mouth every 8 (eight) hours as needed for moderate pain (mild pain). Patient not taking: Reported on 08/11/2017 07/06/15   Molli Posey, MD  linaclotide [facility]) 290 MCG CAPS capsule TAKE ONE CAPSULE BY MOUTH DAILY BEFORE BREAKFAST Patient not taking: Reported on 08/02/2017 05/20/17   Doran Stabler, MD    Physical Exam: Vitals:   08/11/17 1230 08/11/17 1245 08/11/17 1300 08/11/17 1315  BP: 103/61  100/65   Pulse:  76 73 67  Resp: 20 18 16 14   SpO2:  99% 100% 98%    Constitutional: NAD, calm, comfortable Vitals:   08/11/17 1230 08/11/17 1245 08/11/17 1300 08/11/17 1315  BP: 103/61  100/65   Pulse:  76 73 67  Resp: 20 18 16 14   SpO2:  99% 100% 98%   Eyes: PERRL, lids and conjunctivae normal ENMT: Mucous membranes are moist. Posterior pharynx clear of any exudate or lesions.Normal dentition.  Neck: normal, supple, no masses, no thyromegaly Respiratory: clear to auscultation bilaterally, no wheezing, no crackles. Normal respiratory effort. No accessory muscle use.  Cardiovascular: Regular rate and rhythm, no murmurs / rubs / gallops. No extremity edema. 2+ pedal pulses. No carotid bruits.  Abdomen: no tenderness, no masses palpated. No hepatosplenomegaly. Bowel sounds positive.  Musculoskeletal: no clubbing / cyanosis. No joint deformity upper and lower extremities. Good ROM, no contractures. Normal muscle tone.  Skin: no rashes, lesions, ulcers. No induration Neurologic: CN 2-12 grossly intact. Sensation intact, DTR normal. Strength 5/5 in all 4.  Psychiatric: Normal judgment and insight. Alert and oriented x 3. Normal mood.     Labs on  Admission: I have personally reviewed following labs and imaging studies  CBC: Recent Labs  Lab 08/11/17 0753  WBC 23.5*  NEUTROABS 20.1*  HGB 16.7*  HCT 48.0*  MCV 92.3  PLT 756   Basic Metabolic Panel: Recent Labs  Lab 08/11/17 0753  NA 142  K 3.2*  CL 107  CO2 24  GLUCOSE 127*  BUN 21*  CREATININE 1.29*  CALCIUM 8.7*   GFR: Estimated Creatinine Clearance: 59.9 mL/min (A) (by C-G formula based on SCr of 1.29 mg/dL (H)). Liver Function Tests: Recent Labs  Lab 08/11/17 0753  AST 25  ALT 26  ALKPHOS 43  BILITOT 0.2*  PROT 6.9  ALBUMIN 4.2   No results for input(s): LIPASE, AMYLASE in the last 168 hours. No results for input(s): AMMONIA in the last 168 hours. Coagulation Profile: No results for input(s): INR, PROTIME in the last 168 hours. Cardiac Enzymes: Recent Labs  Lab 08/11/17 0753 08/11/17 1116  TROPONINI 0.03* 0.05*   BNP (  last 3 results) No results for input(s): PROBNP in the last 8760 hours. HbA1C: No results for input(s): HGBA1C in the last 72 hours. CBG: No results for input(s): GLUCAP in the last 168 hours. Lipid Profile: No results for input(s): CHOL, HDL, LDLCALC, TRIG, CHOLHDL, LDLDIRECT in the last 72 hours. Thyroid Function Tests: Recent Labs    08/11/17 0753  TSH 5.262*   Anemia Panel: No results for input(s): VITAMINB12, FOLATE, FERRITIN, TIBC, IRON, RETICCTPCT in the last 72 hours. Urine analysis:    Component Value Date/Time   COLORURINE ORANGE (A) 07/09/2015 0955   APPEARANCEUR CLOUDY (A) 07/09/2015 0955   LABSPEC 1.005 07/09/2015 0955   PHURINE 5.5 07/09/2015 0955   GLUCOSEU NEGATIVE 07/09/2015 0955   HGBUR TRACE (A) 07/09/2015 0955   BILIRUBINUR NEGATIVE 07/09/2015 0955   KETONESUR NEGATIVE 07/09/2015 0955   PROTEINUR NEGATIVE 07/09/2015 0955   UROBILINOGEN 0.2 04/17/2014 1555   NITRITE POSITIVE (A) 07/09/2015 0955   LEUKOCYTESUR TRACE (A) 07/09/2015 0955    Radiological Exams on Admission: Ct Soft Tissue Neck W  Contrast  Result Date: 08/11/2017 CLINICAL DATA:  Suspected thyroiditis. Recent left-sided neck pain. Hypotension. EXAM: CT NECK WITH CONTRAST TECHNIQUE: Multidetector CT imaging of the neck was performed using the standard protocol following the bolus administration of intravenous contrast. CONTRAST:  170mL ISOVUE-300 IOPAMIDOL (ISOVUE-300) INJECTION 61% COMPARISON:  Cervical spine CT 05/10/2012 FINDINGS: Pharynx and larynx: Symmetric pharyngeal soft tissues without evidence of mass or swelling. No parapharyngeal or retropharyngeal fluid collection or significant inflammatory changes. Unremarkable larynx. Salivary glands: No inflammation, mass, or stone. Thyroid: Unremarkable. Lymph nodes: No enlarged or suspicious lymph nodes in the neck. Vascular: Major vascular structures of the neck are patent. There is eccentric wall thickening involving an approximately 2 cm long portion of the mid left common carotid artery with infiltrative changes in the surrounding fat. The common carotid artery lumen is only slightly narrowed in this region by low-density material which may reflect fibrofatty plaque. No discrete dissection is apparent. The carotid bifurcation and cervical ICA are unremarkable. Limited intracranial: Unremarkable. Visualized orbits: Unremarkable. Mastoids and visualized paranasal sinuses: Moderate left greater than right ethmoid air cell and mild bilateral frontal and maxillary sinus mucosal thickening. Clear mastoid air cells. Skeleton: No acute osseous abnormality or suspicious osseous lesion. Upper chest: Clear lung apices. Other: None. IMPRESSION: 1. Segmental wall thickening and surrounding inflammation involving the mid left common carotid artery suspicious for vasculitis. 2. Unremarkable CT appearance of the thyroid, although this would be more fully evaluated with ultrasound. Electronically Signed   By: Logan Bores M.D.   On: 08/11/2017 10:21   Ct Abdomen Pelvis W Contrast  Result Date:  08/11/2017 CLINICAL DATA:  Low blood pressure. Nausea, vomiting and diarrhea. Abdominal pain, diverticulitis suspected. EXAM: CT ABDOMEN AND PELVIS WITH CONTRAST TECHNIQUE: Multidetector CT imaging of the abdomen and pelvis was performed using the standard protocol following bolus administration of intravenous contrast. CONTRAST:  145mL ISOVUE-300 IOPAMIDOL (ISOVUE-300) INJECTION 61% COMPARISON:  CT abdomen dated 08/07/2016 FINDINGS: Lower chest: No acute abnormality. Hepatobiliary: No focal liver abnormality is seen. No gallstones, gallbladder wall thickening, or biliary dilatation. Pancreas: Unremarkable. No pancreatic ductal dilatation or surrounding inflammatory changes. Spleen: Normal in size without focal abnormality. Adrenals/Urinary Tract: Adrenal glands appear normal. Kidneys are unremarkable without suspicious mass, stone or hydronephrosis. Small left renal cyst. No obstructing ureteral calculi. Bladder is partially decompressed limiting characterization of its walls, perhaps mild bladder wall thickening circumferentially. Stomach/Bowel: Bowel is normal in caliber. No bowel wall thickening  or evidence of bowel wall inflammation seen. Appendix is normal. Stomach is unremarkable. Vascular/Lymphatic: Aortic atherosclerosis. No enlarged abdominal or pelvic lymph nodes. Reproductive: Status post hysterectomy. No adnexal masses. Small amount of free fluid in the lower pelvis is likely physiologic in nature. Other: No abscess collection.  No free intraperitoneal air. Musculoskeletal: No acute or significant osseous findings. IMPRESSION: 1. No acute findings in the abdomen or pelvis. No bowel obstruction or evidence of bowel wall inflammation seen. No evidence of acute solid organ abnormality. No renal or ureteral calculi. Appendix is normal. 2. Bladder walls appear mildly prominent/thickened circumferentially, but this is likely related to the partial bladder decompression, less likely cystitis. Recommend  correlation with urinalysis and/or clinical presentation. 3. Aortic atherosclerosis. Electronically Signed   By: Franki Cabot M.D.   On: 08/11/2017 10:07   Dg Chest Port 1 View  Result Date: 08/11/2017 CLINICAL DATA:  Syncope. EXAM: PORTABLE CHEST 1 VIEW COMPARISON:  09/18/2012 FINDINGS: Numerous leads and wires project over the chest. Midline trachea. Normal heart size and mediastinal contours. No pleural effusion or pneumothorax. Clear lungs. IMPRESSION: No acute cardiopulmonary disease. Electronically Signed   By: Abigail Miyamoto M.D.   On: 08/11/2017 08:04    EKG: Independently reviewed. Sinus rhythm.  Assessment/Plan Active Problems:   Depression with anxiety   Neck pain   Hypotension   Vomiting   Hypokalemia   Vasculitis (HCC)    Nausea, Vomiting and diarrhea with abdominal pain: Differential include gastroenteritis vs Gastritis from NSAID use VS PUD. CT abdomen and pelvis is not significant. Admit, IV fluids, IV Anti emetics and IV PPI.  Symptomatic management with the above.  If her symptoms persist please call GI for evaluation of PUD.    Left Sided Neck Pain:  Probably from vasculitis of the left common carotid artery.  CT of the soft tissue neck with contrast reveals the vasculitic changes around the left common carotid artery.  No visual loss identified.  Improving left neck pain.  Will need a referral for outpatient rheumatology for follow up .  Vascular surgery consulted by EDP, recommended outpatient follow up with Rheumatology.  One dose of IV solumedrol given in ED.  Start prednisone 60 mg daily from tomorrow and slowly taper over the next few weeks.    Hypokalemia:  Replace as appropriate. Repeat levels in am.    Acute renal failure  Suspect secondary to dehydration.  Prerenal in etiology. Patient received about 3 L of normal saline and will be on maintenance fluids of normal saline at 100 mL/h for the next 24 hours.  Repeat BMP in the  morning.   Leukocytosis Probably reactive versus from vasculitis.  Versus hemoconcentration from dehydration. Hydrate and repeat counts in the morning.   Mildly elevated troponins EKG unremarkable for ischemic changes Patient denies any chest pain shortness of breath.  Her BNP is within normal limits. Serial troponins and if no abnormal or increasing please call cardiology in the morning.   Anxiety with depression  pt slightly anxious in ED, but no signs of depression. Monitor.     DVT prophylaxis: lovenox.  Code Status: Full code Family Communication: None at bedside Disposition Plan: Pending resolution of nausea and vomiting Consults called: None Admission status: Inpatient/Telemetry   Hosie Poisson MD [facility] Pager [phone]  If 7PM-7AM, please contact night-coverage [email or web address] Password [email or web address]  08/11/2017, 1:59 PM

## 2017-08-11 NOTE — ED Notes (Signed)
Bed: CS91 Expected date:  Expected time:  Means of arrival:  Comments: 47 yo F/Hypotension

## 2017-08-12 ENCOUNTER — Inpatient Hospital Stay (HOSPITAL_COMMUNITY): Payer: BLUE CROSS/BLUE SHIELD

## 2017-08-12 DIAGNOSIS — M542 Cervicalgia: Secondary | ICD-10-CM

## 2017-08-12 DIAGNOSIS — R51 Headache: Secondary | ICD-10-CM

## 2017-08-12 LAB — CBC
HCT: 34.2 % — ABNORMAL LOW (ref 36.0–46.0)
Hemoglobin: 11.8 g/dL — ABNORMAL LOW (ref 12.0–15.0)
MCH: 31.8 pg (ref 26.0–34.0)
MCHC: 34.5 g/dL (ref 30.0–36.0)
MCV: 92.2 fL (ref 78.0–100.0)
Platelets: 258 10*3/uL (ref 150–400)
RBC: 3.71 MIL/uL — ABNORMAL LOW (ref 3.87–5.11)
RDW: 12.2 % (ref 11.5–15.5)
WBC: 13.7 10*3/uL — ABNORMAL HIGH (ref 4.0–10.5)

## 2017-08-12 LAB — TROPONIN I: Troponin I: <0.03 ng/mL (ref <0.03)

## 2017-08-12 LAB — BASIC METABOLIC PANEL
Anion gap: 7 (ref 5–15)
BUN: 9 mg/dL (ref 6–20)
CO2: 19 mmol/L — ABNORMAL LOW (ref 22–32)
Calcium: 8.4 mg/dL — ABNORMAL LOW (ref 8.9–10.3)
Chloride: 114 mmol/L — ABNORMAL HIGH (ref 101–111)
Creatinine, Ser: 0.6 mg/dL (ref 0.44–1.00)
GFR calc Af Amer: >60 mL/min (ref >60)
GFR calc non Af Amer: >60 mL/min (ref >60)
Glucose, Bld: 115 mg/dL — ABNORMAL HIGH (ref 65–99)
Potassium: 3.9 mmol/L (ref 3.5–5.1)
Sodium: 140 mmol/L (ref 135–145)

## 2017-08-12 NOTE — Progress Notes (Addendum)
Bilateral carotid duplex completed. Right - 1% to 39% ICA stenosis lower end of range. Left - There is no evidence of significant ICA stenosis. There is mild intimal  wall changes of the mid common carotid artery with no significant stenosis. Vertebral artery flow is antegrade bilaterally. Taylor Knight,RVS 08/12/2017 1:30 PM

## 2017-08-12 NOTE — Progress Notes (Signed)
PROGRESS NOTE    Taylor Knight  EUM:[id number] DOB: [birth year] DOA: 08/11/2017 PCP: System, Pcp Not In    Brief Narrative:  Taylor Knight is a 47 y.o. female with medical history significant of anxiety and depression, seizures reports waking up  with nausea, vomiting and diarrhea on the day of admission. She was also found to have vasculitis of the left common carotid artery.      Assessment & Plan:   Active Problems:   Depression with anxiety   Neck pain   Hypotension   Vomiting   Hypokalemia   Vasculitis (HCC)  Nausea, Vomiting and diarrhea with abdominal pain: Differential include gastroenteritis vs Gastritis from NSAID use VS PUD. CT abdomen and pelvis is not significant. Admit for symptomatic management. With  IV fluids, IV Anti emetics and IV PPI.  Today patient reports her nausea, vomiting and diarrhea resolved.  Change to po protonix .    Left Sided Neck Pain:  Probably from vasculitis of the left common carotid artery.  CT of the soft tissue neck with contrast reveals the vasculitic changes around the left common carotid artery.  No visual loss identified.  Improving left neck pain.  Will need a referral for outpatient rheumatology for follow up .  Vascular surgery consulted by EDP, recommended outpatient follow up with Rheumatology.  One dose of IV solumedrol given in ED.  Started on prednisone taper, will need a few weeks of tapering prednisone.  US carotid on the left ordered and reviewed, no signs of dissection or stenosis. Vertebral artery flow is antegrade bilaterally.   Hypokalemia:  Replace as appropriate. Repeat levels    Acute renal failure  Suspect secondary to dehydration.  Prerenal in etiology. Repeat BMP shows resolution of ARF.   Leukocytosis Probably reactive versus from vasculitis.  Versus hemoconcentration from dehydration. Hydrate and repeat counts in the morning show much improvement. .  Mildly elevated troponins possibly demand  ischemia. EKG unremarkable for ischemic changes Patient denies any chest pain shortness of breath.  Her BNP is within normal limits. Serial troponins negative.  Repeat EKG NSR without ischemic changes.     Anxiety with depression Stable.     DVT prophylaxis: scd's Code Status:  Full code Family Communication: none at bedside.  Disposition Plan:possibly home in am if no complaints.   Consultants:   Vascular surgery over the phone.   Procedures:  US carotid.   Antimicrobials: (none.   Subjective: Reports feeling better. No chest pain or sob   Objective: Vitals:   08/11/17 1409 08/11/17 2125 08/12/17 0515 08/12/17 1405  BP: 114/79 116/72 112/65 122/66  Pulse: 70 75 72 64  Resp: 16 18 18 17   Temp: 98 F (36.7 C) 98.1 F (36.7 C) 97.9 F (36.6 C) 99.4 F (37.4 C)  TempSrc: Oral Oral Oral Oral  SpO2: 100% 99% 98% 98%  Weight: 83.9 kg (184 lb 14.4 oz)     Height: 5\' 6"  (1.676 m)       Intake/Output Summary (Last 24 hours) at 08/12/2017 1840 Last data filed at 08/12/2017 5400 Gross per 24 hour  Intake 1235 ml  Output -  Net 1235 ml   Filed Weights   08/11/17 1409  Weight: 83.9 kg (184 lb 14.4 oz)    Examination:  General exam: Appears calm and comfortable  Respiratory system: Clear to auscultation. Respiratory effort normal. Cardiovascular system: S1 & S2 heard, RRR. No JVD, murmurs, rubs, gallops or clicks. No pedal edema. Gastrointestinal system: Abdomen is nondistended, soft and  nontender. No organomegaly or masses felt. Normal bowel sounds heard. Central nervous system: Alert and oriented. No focal neurological deficits. Extremities: Symmetric 5 x 5 power. Skin: No rashes, lesions or ulcers Psychiatry: Judgement and insight appear normal. Mood & affect appropriate.     Data Reviewed: I have personally reviewed following labs and imaging studies  CBC: Recent Labs  Lab 08/11/17 0753 08/12/17 0508  WBC 23.5* 13.7*  NEUTROABS 20.1*  --   HGB  16.7* 11.8*  HCT 48.0* 34.2*  MCV 92.3 92.2  PLT 335 812   Basic Metabolic Panel: Recent Labs  Lab 08/11/17 0753 08/12/17 0508  NA 142 140  K 3.2* 3.9  CL 107 114*  CO2 24 19*  GLUCOSE 127* 115*  BUN 21* 9  CREATININE 1.29* 0.60  CALCIUM 8.7* 8.4*   GFR: Estimated Creatinine Clearance: 97.9 mL/min (by C-G formula based on SCr of 0.6 mg/dL). Liver Function Tests: Recent Labs  Lab 08/11/17 0753  AST 25  ALT 26  ALKPHOS 43  BILITOT 0.2*  PROT 6.9  ALBUMIN 4.2   No results for input(s): LIPASE, AMYLASE in the last 168 hours. No results for input(s): AMMONIA in the last 168 hours. Coagulation Profile: No results for input(s): INR, PROTIME in the last 168 hours. Cardiac Enzymes: Recent Labs  Lab 08/11/17 0753 08/11/17 1116 08/11/17 1659 08/11/17 2306 08/12/17 0508  TROPONINI 0.03* 0.05* <0.03 <0.03 <0.03   BNP (last 3 results) No results for input(s): PROBNP in the last 8760 hours. HbA1C: No results for input(s): HGBA1C in the last 72 hours. CBG: No results for input(s): GLUCAP in the last 168 hours. Lipid Profile: No results for input(s): CHOL, HDL, LDLCALC, TRIG, CHOLHDL, LDLDIRECT in the last 72 hours. Thyroid Function Tests: Recent Labs    08/11/17 0753  TSH 5.262*   Anemia Panel: No results for input(s): VITAMINB12, FOLATE, FERRITIN, TIBC, IRON, RETICCTPCT in the last 72 hours. Sepsis Labs: No results for input(s): PROCALCITON, LATICACIDVEN in the last 168 hours.  No results found for this or any previous visit (from the past 240 hour(s)).       Radiology Studies: Ct Soft Tissue Neck W Contrast  Result Date: 08/11/2017 CLINICAL DATA:  Suspected thyroiditis. Recent left-sided neck pain. Hypotension. EXAM: CT NECK WITH CONTRAST TECHNIQUE: Multidetector CT imaging of the neck was performed using the standard protocol following the bolus administration of intravenous contrast. CONTRAST:  137mL ISOVUE-300 IOPAMIDOL (ISOVUE-300) INJECTION 61%  COMPARISON:  Cervical spine CT 05/10/2012 FINDINGS: Pharynx and larynx: Symmetric pharyngeal soft tissues without evidence of mass or swelling. No parapharyngeal or retropharyngeal fluid collection or significant inflammatory changes. Unremarkable larynx. Salivary glands: No inflammation, mass, or stone. Thyroid: Unremarkable. Lymph nodes: No enlarged or suspicious lymph nodes in the neck. Vascular: Major vascular structures of the neck are patent. There is eccentric wall thickening involving an approximately 2 cm long portion of the mid left common carotid artery with infiltrative changes in the surrounding fat. The common carotid artery lumen is only slightly narrowed in this region by low-density material which may reflect fibrofatty plaque. No discrete dissection is apparent. The carotid bifurcation and cervical ICA are unremarkable. Limited intracranial: Unremarkable. Visualized orbits: Unremarkable. Mastoids and visualized paranasal sinuses: Moderate left greater than right ethmoid air cell and mild bilateral frontal and maxillary sinus mucosal thickening. Clear mastoid air cells. Skeleton: No acute osseous abnormality or suspicious osseous lesion. Upper chest: Clear lung apices. Other: None. IMPRESSION: 1. Segmental wall thickening and surrounding inflammation involving the mid left common  carotid artery suspicious for vasculitis. 2. Unremarkable CT appearance of the thyroid, although this would be more fully evaluated with ultrasound. Electronically Signed   By: Logan Bores M.D.   On: 08/11/2017 10:21   Ct Abdomen Pelvis W Contrast  Result Date: 08/11/2017 CLINICAL DATA:  Low blood pressure. Nausea, vomiting and diarrhea. Abdominal pain, diverticulitis suspected. EXAM: CT ABDOMEN AND PELVIS WITH CONTRAST TECHNIQUE: Multidetector CT imaging of the abdomen and pelvis was performed using the standard protocol following bolus administration of intravenous contrast. CONTRAST:  196mL ISOVUE-300 IOPAMIDOL  (ISOVUE-300) INJECTION 61% COMPARISON:  CT abdomen dated 08/07/2016 FINDINGS: Lower chest: No acute abnormality. Hepatobiliary: No focal liver abnormality is seen. No gallstones, gallbladder wall thickening, or biliary dilatation. Pancreas: Unremarkable. No pancreatic ductal dilatation or surrounding inflammatory changes. Spleen: Normal in size without focal abnormality. Adrenals/Urinary Tract: Adrenal glands appear normal. Kidneys are unremarkable without suspicious mass, stone or hydronephrosis. Small left renal cyst. No obstructing ureteral calculi. Bladder is partially decompressed limiting characterization of its walls, perhaps mild bladder wall thickening circumferentially. Stomach/Bowel: Bowel is normal in caliber. No bowel wall thickening or evidence of bowel wall inflammation seen. Appendix is normal. Stomach is unremarkable. Vascular/Lymphatic: Aortic atherosclerosis. No enlarged abdominal or pelvic lymph nodes. Reproductive: Status post hysterectomy. No adnexal masses. Small amount of free fluid in the lower pelvis is likely physiologic in nature. Other: No abscess collection.  No free intraperitoneal air. Musculoskeletal: No acute or significant osseous findings. IMPRESSION: 1. No acute findings in the abdomen or pelvis. No bowel obstruction or evidence of bowel wall inflammation seen. No evidence of acute solid organ abnormality. No renal or ureteral calculi. Appendix is normal. 2. Bladder walls appear mildly prominent/thickened circumferentially, but this is likely related to the partial bladder decompression, less likely cystitis. Recommend correlation with urinalysis and/or clinical presentation. 3. Aortic atherosclerosis. Electronically Signed   By: Franki Cabot M.D.   On: 08/11/2017 10:07   Dg Chest Port 1 View  Result Date: 08/11/2017 CLINICAL DATA:  Syncope. EXAM: PORTABLE CHEST 1 VIEW COMPARISON:  09/18/2012 FINDINGS: Numerous leads and wires project over the chest. Midline trachea. Normal  heart size and mediastinal contours. No pleural effusion or pneumothorax. Clear lungs. IMPRESSION: No acute cardiopulmonary disease. Electronically Signed   By: Abigail Miyamoto M.D.   On: 08/11/2017 08:04        Scheduled Meds: . enoxaparin (LOVENOX) injection  40 mg Subcutaneous Q24H  . Influenza vac split quadrivalent PF  0.5 mL Intramuscular Tomorrow-1000  . pantoprazole (PROTONIX) IV  40 mg Intravenous Q24H  . predniSONE  60 mg Oral QAC breakfast   Continuous Infusions: . sodium chloride Stopped (08/11/17 1533)  . sodium chloride 100 mL/hr at 08/12/17 1015     LOS: 1 day    Time spent: 35 minutes.     Hosie Poisson, MD [facility] Pager [phone]  If 7PM-7AM, please contact night-coverage [email or web address] Password [facility] 08/12/2017, 6:40 PM

## 2017-08-13 ENCOUNTER — Inpatient Hospital Stay (HOSPITAL_COMMUNITY): Payer: BLUE CROSS/BLUE SHIELD

## 2017-08-13 DIAGNOSIS — F418 Other specified anxiety disorders: Secondary | ICD-10-CM

## 2017-08-13 DIAGNOSIS — E876 Hypokalemia: Secondary | ICD-10-CM

## 2017-08-13 DIAGNOSIS — R748 Abnormal levels of other serum enzymes: Secondary | ICD-10-CM

## 2017-08-13 DIAGNOSIS — G9001 Carotid sinus syncope: Secondary | ICD-10-CM

## 2017-08-13 LAB — C-REACTIVE PROTEIN: CRP: 0.8 mg/dL (ref <1.0)

## 2017-08-13 LAB — HIV ANTIBODY (ROUTINE TESTING W REFLEX): HIV Screen 4th Generation wRfx: NONREACTIVE

## 2017-08-13 LAB — SEDIMENTATION RATE: Sed Rate: 8 mm/hr (ref 0–22)

## 2017-08-13 MED ORDER — PREDNISONE 20 MG PO TABS: 60.0000 mg | ORAL_TABLET | Freq: Every day | ORAL | 1 refills | Status: DC

## 2017-08-13 MED ORDER — ASPIRIN 81 MG PO TBEC: 81.0000 mg | DELAYED_RELEASE_TABLET | Freq: Every day | ORAL | 1 refills | Status: DC

## 2017-08-13 MED ORDER — TRAMADOL HCL 50 MG PO TABS: 50.0000 mg | ORAL_TABLET | Freq: Four times a day (QID) | ORAL | 0 refills | Status: DC | PRN

## 2017-08-13 MED ORDER — PANTOPRAZOLE SODIUM 40 MG PO TBEC: 40.0000 mg | DELAYED_RELEASE_TABLET | Freq: Every day | ORAL | 1 refills | Status: DC

## 2017-08-13 MED ORDER — ASPIRIN EC 81 MG PO TBEC
81.0000 mg | DELAYED_RELEASE_TABLET | Freq: Every day | ORAL | Status: DC
Start: 2017-08-13 — End: 2017-08-13
  Administered 2017-08-13: 81 mg via ORAL
  Filled 2017-08-13: qty 1

## 2017-08-13 NOTE — Progress Notes (Signed)
Received pt back from MRI in good condition. Agree with previous RN  assessment

## 2017-08-13 NOTE — Progress Notes (Signed)
MRI results received. MD Abrol updated. MD ok to discharge pt

## 2017-08-13 NOTE — Discharge Summary (Addendum)
Physician Discharge Summary  Taylor Knight MRN: [id number] DOB/AGE: 07/14/1970 47 y.o.  PCP: System, Pcp Not In   Admit date: 08/11/2017 Discharge date: 08/13/2017  Discharge Diagnoses:    Active Problems:   Depression with anxiety   Neck pain   Hypotension   Vomiting   Hypokalemia   Vasculitis (HCC)    Follow-up recommendations Follow-up with PCP in 3-5 days , including all  additional recommended appointments as below Follow-up CBC, CMP in 3-5 days Patient has been provided outpatient referral for rheumatology, she needs follow-up for Segmental wall thickening and surrounding inflammation involving the mid left common carotid artery suspicious for vasculitis      Allergies as of 08/13/2017      Reactions   Penicillins Hives   Has patient had a PCN reaction causing immediate rash, facial/tongue/throat swelling, SOB or lightheadedness with hypotension: Yes Has patient had a PCN reaction causing severe rash involving mucus membranes or skin necrosis: Yes Has patient had a PCN reaction that required hospitalization No Has patient had a PCN reaction occurring within the last 10 years: No  If all of the above answers are "NO", then may proceed with Cephalosporin use.      Medication List    STOP taking these medications   ibuprofen 200 MG tablet Commonly known as:  ADVIL,MOTRIN   ibuprofen 800 MG tablet Commonly known as:  ADVIL,MOTRIN     TAKE these medications   aspirin 81 MG EC tablet Take 1 tablet (81 mg total) by mouth daily.   azithromycin 250 MG tablet Commonly known as:  ZITHROMAX Sig as indicated   linaclotide 290 MCG Caps capsule Commonly known as:  LINZESS TAKE ONE CAPSULE BY MOUTH DAILY BEFORE BREAKFAST   pantoprazole 40 MG tablet Commonly known as:  PROTONIX Take 1 tablet (40 mg total) by mouth daily.   predniSONE 20 MG tablet Commonly known as:  DELTASONE Take 3 tablets (60 mg total) by mouth daily with breakfast.   traMADol 50 MG  tablet Commonly known as:  ULTRAM Take 1 tablet (50 mg total) by mouth every 6 (six) hours as needed for moderate pain or severe pain.         Discharge Condition:  Stable   Discharge Instructions Get Medicines reviewed and adjusted: Please take all your medications with you for your next visit with your Primary MD  Please request your Primary MD to go over all hospital tests and procedure/radiological results at the follow up, please ask your Primary MD to get all Hospital records sent to his/her office.  If you experience worsening of your admission symptoms, develop shortness of breath, life threatening emergency, suicidal or homicidal thoughts you must seek medical attention immediately by calling 911 or calling your MD immediately if symptoms less severe.  You must read complete instructions/literature along with all the possible adverse reactions/side effects for all the Medicines you take and that have been prescribed to you. Take any new Medicines after you have completely understood and accpet all the possible adverse reactions/side effects.   Do not drive when taking Pain medications.   Do not take more than prescribed Pain, Sleep and Anxiety Medications  Special Instructions: If you have smoked or chewed Tobacco in the last 2 yrs please stop smoking, stop any regular Alcohol and or any Recreational drug use.  Wear Seat belts while driving.  Please note  You were cared for by a hospitalist during your hospital stay. Once you are discharged, your primary care physician will  handle any further medical issues. Please note that NO REFILLS for any discharge medications will be authorized once you are discharged, as it is imperative that you return to your primary care physician (or establish a relationship with a primary care physician if you do not have one) for your aftercare needs so that they can reassess your need for medications and monitor your lab values.  Discharge  Instructions    Ambulatory referral to Rheumatology   Complete by:  As directed    Carotid artery vasculitis       Allergies  Allergen Reactions  . Penicillins Hives    Has patient had a PCN reaction causing immediate rash, facial/tongue/throat swelling, SOB or lightheadedness with hypotension: Yes Has patient had a PCN reaction causing severe rash involving mucus membranes or skin necrosis: Yes Has patient had a PCN reaction that required hospitalization No Has patient had a PCN reaction occurring within the last 10 years: No  If all of the above answers are "NO", then may proceed with Cephalosporin use.       Disposition: 01-Home or Self Care   Consults:  None    Significant Diagnostic Studies:  Ct Soft Tissue Neck W Contrast  Result Date: 08/11/2017 CLINICAL DATA:  Suspected thyroiditis. Recent left-sided neck pain. Hypotension. EXAM: CT NECK WITH CONTRAST TECHNIQUE: Multidetector CT imaging of the neck was performed using the standard protocol following the bolus administration of intravenous contrast. CONTRAST:  151m ISOVUE-300 IOPAMIDOL (ISOVUE-300) INJECTION 61% COMPARISON:  Cervical spine CT 05/10/2012 FINDINGS: Pharynx and larynx: Symmetric pharyngeal soft tissues without evidence of mass or swelling. No parapharyngeal or retropharyngeal fluid collection or significant inflammatory changes. Unremarkable larynx. Salivary glands: No inflammation, mass, or stone. Thyroid: Unremarkable. Lymph nodes: No enlarged or suspicious lymph nodes in the neck. Vascular: Major vascular structures of the neck are patent. There is eccentric wall thickening involving an approximately 2 cm long portion of the mid left common carotid artery with infiltrative changes in the surrounding fat. The common carotid artery lumen is only slightly narrowed in this region by low-density material which may reflect fibrofatty plaque. No discrete dissection is apparent. The carotid bifurcation and cervical ICA  are unremarkable. Limited intracranial: Unremarkable. Visualized orbits: Unremarkable. Mastoids and visualized paranasal sinuses: Moderate left greater than right ethmoid air cell and mild bilateral frontal and maxillary sinus mucosal thickening. Clear mastoid air cells. Skeleton: No acute osseous abnormality or suspicious osseous lesion. Upper chest: Clear lung apices. Other: None. IMPRESSION: 1. Segmental wall thickening and surrounding inflammation involving the mid left common carotid artery suspicious for vasculitis. 2. Unremarkable CT appearance of the thyroid, although this would be more fully evaluated with ultrasound. Electronically Signed   By: ALogan BoresM.D.   On: 08/11/2017 10:21   Ct Abdomen Pelvis W Contrast  Result Date: 08/11/2017 CLINICAL DATA:  Low blood pressure. Nausea, vomiting and diarrhea. Abdominal pain, diverticulitis suspected. EXAM: CT ABDOMEN AND PELVIS WITH CONTRAST TECHNIQUE: Multidetector CT imaging of the abdomen and pelvis was performed using the standard protocol following bolus administration of intravenous contrast. CONTRAST:  1037mISOVUE-300 IOPAMIDOL (ISOVUE-300) INJECTION 61% COMPARISON:  CT abdomen dated 08/07/2016 FINDINGS: Lower chest: No acute abnormality. Hepatobiliary: No focal liver abnormality is seen. No gallstones, gallbladder wall thickening, or biliary dilatation. Pancreas: Unremarkable. No pancreatic ductal dilatation or surrounding inflammatory changes. Spleen: Normal in size without focal abnormality. Adrenals/Urinary Tract: Adrenal glands appear normal. Kidneys are unremarkable without suspicious mass, stone or hydronephrosis. Small left renal cyst. No obstructing ureteral calculi. Bladder  is partially decompressed limiting characterization of its walls, perhaps mild bladder wall thickening circumferentially. Stomach/Bowel: Bowel is normal in caliber. No bowel wall thickening or evidence of bowel wall inflammation seen. Appendix is normal. Stomach is  unremarkable. Vascular/Lymphatic: Aortic atherosclerosis. No enlarged abdominal or pelvic lymph nodes. Reproductive: Status post hysterectomy. No adnexal masses. Small amount of free fluid in the lower pelvis is likely physiologic in nature. Other: No abscess collection.  No free intraperitoneal air. Musculoskeletal: No acute or significant osseous findings. IMPRESSION: 1. No acute findings in the abdomen or pelvis. No bowel obstruction or evidence of bowel wall inflammation seen. No evidence of acute solid organ abnormality. No renal or ureteral calculi. Appendix is normal. 2. Bladder walls appear mildly prominent/thickened circumferentially, but this is likely related to the partial bladder decompression, less likely cystitis. Recommend correlation with urinalysis and/or clinical presentation. 3. Aortic atherosclerosis. Electronically Signed   By: Franki Cabot M.D.   On: 08/11/2017 10:07   Dg Chest Port 1 View  Result Date: 08/11/2017 CLINICAL DATA:  Syncope. EXAM: PORTABLE CHEST 1 VIEW COMPARISON:  09/18/2012 FINDINGS: Numerous leads and wires project over the chest. Midline trachea. Normal heart size and mediastinal contours. No pleural effusion or pneumothorax. Clear lungs. IMPRESSION: No acute cardiopulmonary disease. Electronically Signed   By: Abigail Miyamoto M.D.   On: 08/11/2017 08:04        Filed Weights   08/11/17 1409  Weight: 83.9 kg (184 lb 14.4 oz)     Microbiology: No results found for this or any previous visit (from the past 240 hour(s)).     Blood Culture    Component Value Date/Time   SDES URINE, CLEAN CATCH 07/09/2015 0955   SPECREQUEST NONE 07/09/2015 0955   CULT  07/09/2015 0955    20,000 COLONIES/mL GROUP B STREP(S.AGALACTIAE)ISOLATED TESTING AGAINST S. AGALACTIAE NOT ROUTINELY PERFORMED DUE TO PREDICTABILITY OF AMP/PEN/VAN SUSCEPTIBILITY. Performed at Between 07/10/2015 FINAL 07/09/2015 0955      Labs: Results for orders placed or  performed during the hospital encounter of 08/11/17 (from the past 48 hour(s))  Troponin I     Status: Abnormal   Collection Time: 08/11/17 11:16 AM  Result Value Ref Range   Troponin I 0.05 (HH) <0.03 ng/mL    Comment: CRITICAL VALUE NOTED.  VALUE IS CONSISTENT WITH PREVIOUSLY REPORTED AND CALLED VALUE. Performed at [facility], [place] [address], [place], [place] [address]   Urinalysis, Routine w reflex microscopic     Status: Abnormal   Collection Time: 08/11/17  4:45 PM  Result Value Ref Range   Color, Urine YELLOW YELLOW   APPearance CLEAR CLEAR   Specific Gravity, Urine 1.032 (H) 1.005 - 1.030   pH 7.0 5.0 - 8.0   Glucose, UA NEGATIVE NEGATIVE mg/dL   Hgb urine dipstick MODERATE (A) NEGATIVE   Bilirubin Urine NEGATIVE NEGATIVE   Ketones, ur 5 (A) NEGATIVE mg/dL   Protein, ur NEGATIVE NEGATIVE mg/dL   Nitrite NEGATIVE NEGATIVE   Leukocytes, UA NEGATIVE NEGATIVE   RBC / HPF 6-30 0 - 5 RBC/hpf   WBC, UA 0-5 0 - 5 WBC/hpf   Bacteria, UA RARE (A) NONE SEEN   Squamous Epithelial / LPF 0-5 (A) NONE SEEN   Hyaline Casts, UA PRESENT     Comment: Performed at [facility], [place] [address], [place], [place] [address]  Troponin I     Status: None   Collection Time: 08/11/17  4:59 PM  Result  Value Ref Range   Troponin I <0.03 <0.03 ng/mL    Comment: Performed at [facility], [place] [address] [place], [place] [address]  HIV antibody (Routine Testing)     Status: None   Collection Time: 08/11/17  4:59 PM  Result Value Ref Range   HIV Screen 4th Generation wRfx Non Reactive Non Reactive    Comment: (NOTE) Performed At: [facility] [place], [place] [id number] Rush Farmer MD [id number] Performed at [facility], [place] [address], [place], [place] [address]   Troponin I     Status: None   Collection Time: 08/11/17 11:06 PM  Result Value Ref Range   Troponin I <0.03 <0.03 ng/mL     Comment: Performed at [facility], [place] [address], [place], [place] [address]  CBC     Status: Abnormal   Collection Time: 08/12/17  5:08 AM  Result Value Ref Range   WBC 13.7 (H) 4.0 - 10.5 K/uL   RBC 3.71 (L) 3.87 - 5.11 MIL/uL   Hemoglobin 11.8 (L) 12.0 - 15.0 g/dL    Comment: REPEATED TO VERIFY DELTA CHECK NOTED    HCT 34.2 (L) 36.0 - 46.0 %   MCV 92.2 78.0 - 100.0 fL   MCH 31.8 26.0 - 34.0 pg   MCHC 34.5 30.0 - 36.0 g/dL   RDW 12.2 11.5 - 15.5 %   Platelets 258 150 - 400 K/uL    Comment: Performed at [facility], [place] [address], [place], [place] [address]  Basic metabolic panel     Status: Abnormal   Collection Time: 08/12/17  5:08 AM  Result Value Ref Range   Sodium 140 135 - 145 mmol/L   Potassium 3.9 3.5 - 5.1 mmol/L    Comment: DELTA CHECK NOTED NO VISIBLE HEMOLYSIS REPEATED TO VERIFY    Chloride 114 (H) 101 - 111 mmol/L   CO2 19 (L) 22 - 32 mmol/L   Glucose, Bld 115 (H) 65 - 99 mg/dL   BUN 9 6 - 20 mg/dL   Creatinine, Ser 0.60 0.44 - 1.00 mg/dL   Calcium 8.4 (L) 8.9 - 10.3 mg/dL   GFR calc non Af Amer >60 >60 mL/min   GFR calc Af Amer >60 >60 mL/min    Comment: (NOTE) The eGFR has been calculated using the CKD EPI equation. This calculation has not been validated in all clinical situations. eGFR's persistently <60 mL/min signify possible Chronic Kidney Disease.    Anion gap 7 5 - 15    Comment: Performed at [facility], [place] [address], [place], [place] [address]  Troponin I     Status: None   Collection Time: 08/12/17  5:08 AM  Result Value Ref Range   Troponin I <0.03 <0.03 ng/mL    Comment: Performed at [facility], [place] [address] [place], [place] [address]     Lipid Panel  No results found for: CHOL, TRIG, HDL, CHOLHDL, VLDL, LDLCALC, LDLDIRECT   No results found for: HGBA1C   Lab Results  Component Value Date   CREATININE 0.60 08/12/2017     HPI :  47 y.o.  female with medical history significant of anxiety and depression, seizures reports waking up around 4 am today, with nausea, vomiting and diarrhea. After multiple episodes of vomiting and diarrhea, she felt weak and dizzy, felt like passing out. Her husband helped her to the living room and EMS called. When EMS arrived she  was profoundly hypotensive and was brought to ED for further evaluation.  Her hypotension resolved with 3 lit fluid boluses. Pt reports she had left sided neck pain since 2 weeks, was seen at urgent care, was told she probably has thyroiditis and was given antibiotics and IBUPROFEN. She had completed antibiotics last Sunday, but has continued to use Ibuprofen every now and then for the left sided neck pain. She was supposed to get an US of the thyroid if her neck pain does not resolve.  In the ED she reports her neck pain is minimal, but she complains of a headache, bad taste in the mouth from vomiting, feels weak and nauseated. No fever or chills. abd pain is minimal. No sob, chest pain or cough.  No similar complaints in the past. None sick at home.   ED lab work revealed hypokalemia of 3.2, minimally elevated troponins of 0.03 and 0.05, wbc count of 23,000 and hemoglobin of 16.7. CT abd and pelvis was unremarkable.  CT soft tissue of the neck shows possible vasculitis of the  Mid part of left  Common carotid artery. EDP did speak with Dr. Donnetta Hutching as pt has possible vasculitis of left carotid artery.  He recommended  pt f/u with rheumatology as that is not his expertise.    Pt given 1 dose of solumedrol 125 mg for the vasculitis.     HOSPITAL COURSE:    Nausea, Vomiting and diarrhea with abdominal pain: Differential include gastroenteritis vs Gastritis from NSAID use VS PUD. CT abdomen and pelvis is not significant. Admitted for symptomatic management, hydrated with   IV fluids, IV Anti emetics and IV PPI. Change to oral Protonix Tolerating regular diet  Left Sided Neck  Pain:  Probably from vasculitis of the left common carotid artery.CT of the soft tissue neck with contrast reveals the vasculitic changes around the left common carotid artery. No visual loss identified.  Improvingleft neck pain.  Has been provided with outpatient referral for outpatient rheumatology for follow up .  One dose of IV solumedrol given in ED.  continue prednisone 60 mg by mouth daily. ESR pending at the time of discharge  US carotid on the left ordered and reviewed, no signs of dissection or stenosis. Vertebral artery flow is antegrade bilaterally.  Patient complained of perioral numbness and tingling on the day of admission which has since resolved MRI of the brain showed.............................. Patient has been started on aspirin 81 mg a day, and advised to discontinue ibuprofen She has been advised to return to the ER if her symptoms recur Otherwise she remains on prednisone 60 mg a day until she sees her rheumatologist   Hypokalemia:  Replace as appropriate. Repeat levels wnl   Acute renal failure Suspect secondary to dehydration. Prerenal in etiology. Repeat BMP shows resolution of ARF.   Leukocytosis Probably reactive versus from vasculitis. Versus hemoconcentration from dehydration. Hydrate and repeat counts in the morning show much improvement. .  Nausea vomiting unrelated to any cardiac issues EKG unremarkable for ischemic changes Patient denies any chest pain shortness of breath. Her BNP is within normal limits. Serial troponins negative.  Repeat EKG NSR without ischemic changes.     Anxiety with depression Stable.     Discharge Exam  Blood pressure 125/79, pulse (!) 52, temperature 97.6 F (36.4 C), temperature source Oral, resp. rate 16, height '5\' 6"'  (1.676 m), weight 83.9 kg (184 lb 14.4 oz), last menstrual period 06/09/2015, SpO2 98 %.  Cardiovascular system: S1 & S2 heard,  RRR. No JVD, murmurs, rubs, gallops or clicks. No  pedal edema. Gastrointestinal system: Abdomen is nondistended, soft and nontender. No organomegaly or masses felt. Normal bowel sounds heard. Central nervous system: Alert and oriented. No focal neurological deficits. Extremities: Symmetric 5 x 5 power. Skin: No rashes, lesions or ulcers Psychiatry: Judgement and insight appear normal. Mood & affect appropriate.        Follow-up Information    Rheumatology, [place]. Schedule an appointment as soon as possible for a visit in 1 week(s).   Why:  for vasculitis of the left Common carotid artery .  Contact information: [address] [place] [address] [phone]        Primary care provider. Call.   Why:  3-5 days          Signed: Reyne Dumas 08/13/2017, 11:11 AM        Time spent >1 hour

## 2017-08-14 ENCOUNTER — Telehealth: Payer: Self-pay | Admitting: *Deleted

## 2017-08-14 ENCOUNTER — Other Ambulatory Visit: Payer: Self-pay

## 2017-08-14 ENCOUNTER — Ambulatory Visit (INDEPENDENT_AMBULATORY_CARE_PROVIDER_SITE_OTHER): Payer: BLUE CROSS/BLUE SHIELD | Admitting: Emergency Medicine

## 2017-08-14 ENCOUNTER — Encounter: Payer: Self-pay | Admitting: Emergency Medicine

## 2017-08-14 VITALS — BP 136/80 | HR 59 | Temp 98.7°F | Resp 16 | Wt 184.4 lb

## 2017-08-14 DIAGNOSIS — I776 Arteritis, unspecified: Secondary | ICD-10-CM

## 2017-08-14 NOTE — Patient Instructions (Addendum)
IF you received an x-ray today, you will receive an invoice from [facility]. Please contact [facility] at [phone] with questions or concerns regarding your invoice.   IF you received labwork today, you will receive an invoice from [place]. Please contact [facility] at [phone] with questions or concerns regarding your invoice.   Our billing staff will not be able to assist you with questions regarding bills from these companies.  You will be contacted with the lab results as soon as they are available. The fastest way to get your results is to activate your My Chart account. Instructions are located on the last page of this paperwork. If you have not heard from [place] regarding the results in 2 weeks, please contact this office.    Vasculitis Vasculitis is swelling (inflammation) of the blood vessels. With vasculitis, the blood vessels can become thick, narrow, scarred, or weak, and enough blood may not be able to flow through them. This can cause damage to the muscles, kidneys, lungs, brain, and other parts of the body. There are many types of vasculitis. Some last only a short time while others last a long time. What are the causes? The exact cause is unknown, but vasculitis can develop when the body's immune system attacks its own blood vessels. This attack can be caused by:  An infection.  An immune system disease, such as lupus, rheumatoid arthritis, or scleroderma.  An allergic reaction to a medicine.  Cancer that affects blood cells, such as leukemia and lymphoma.  What increases the risk?  Being a smoker.  Being under stress.  Having a physical injury. What are the signs or symptoms? Symptoms vary depending on the type of vasculitis you have. Symptoms that are common to all types of vasculitis include:  Fever.  Poor appetite.  Weight loss.  Feeling very tired.  Having aches and pains.  Weakness.  Numbness in an area of your  body.  Symptoms for specific types of vasculitis include:  Skin problems, such as sores, spots, or rashes.  Trouble seeing.  Trouble breathing.  Blood in your urine.  Headaches.  Stomach pain.  Stuffy or bloody nose.  How is this diagnosed? Your health care provider will ask about your symptoms and do a physical exam. You may have tests done, such as:  A complete blood count (CBC).  Erythrocyte sedimentation, also called sed rate test.  C-reactive protein (CRP).  Antineutrophil cytoplasmic antibodies (ANCA).  A urine test.  A biopsy of a blood vessel.  A nerve conduction study.  Imaging tests, such as: ? X-rays. ? A CT scan. ? An ultrasound. ? An MRI. ? Angiography.  How is this treated? Treatment will depend on the type of vasculitis you have and how severe it is. Sometimes treatment is not needed. Treatment often includes:  Medicines.  Physical therapy or occupational therapy. This helps strengthen muscles that were weakened by the disease.  You will need to see your health care provider while you are being treated. During follow-up visits, your health care provider will:  Perform blood tests and bone density tests.  Check your blood pressure and blood sugar.  Check for side effects of any medicines you are taking.  Vasculitis cannot always be cured. Sometimes symptoms go away but the disease does not (the disease goes in remission). If symptoms return, increased treatment may be needed. Follow these instructions at home:  Take medicines only as directed by your health care provider.  Keep all follow-up  visits as directed by your health care provider. This is important.  Exercise. Talk with your health care provider about what exercises are okay for you to do. Usually exercises that increase your heart rate (aerobic exercise), such as walking, are recommended. Aerobic exercise helps control your blood pressure and prevent bone loss.  Follow a  healthy diet. Include healthy sources of protein, fruits, vegetables, and whole grains in your diet.  Learn as much as you can about vasculitis, and consider joining a support group. Understanding your condition and talking with others who have it may help you cope. Talk with your health care provider if you feel stressed, anxious, or depressed. Contact a health care provider if:  Your symptoms return, or you have new symptoms.  Your fever, fatigue, headache, or weight loss gets worse.  You have signs of infection, such as fever, warmth, tenderness, redness, or swelling. Get help right away if:  Your vision gets worse.  Your pain does not go away, even after you take pain medicine.  You have chest or stomach pain.  You have trouble breathing.  One side of your face or body suddenly becomes weak or numb.  Your nose bleeds.  There is blood in your urine. This information is not intended to replace advice given to you by your health care provider. Make sure you discuss any questions you have with your health care provider. Document Released: 03/17/2009 Document Revised: 10/27/2015 Document Reviewed: 07/15/2013 Elsevier Interactive Patient Education  [employer].

## 2017-08-14 NOTE — Telephone Encounter (Signed)
Faxed request for diclofenac refill to patient's pharmacy. Request denied need office visit. Confirmation page received at 10:39 am.

## 2017-08-14 NOTE — Progress Notes (Signed)
Taylor Knight 47 y.o.   Chief Complaint  Patient presents with  . Referral    rheumatology    HISTORY OF PRESENT ILLNESS: This is a 47 y.o. female seen by me on 2/27/ 2019 for left sided neck pain.  Treated with antibiotics and NSAIDs for possible acute thyroiditis.  Last Sunday morning patient got very sick at home.  EMS found her hypotensive after vomiting several times.  Resuscitated with IV fluids.  Taken to the hospital.  Workup showed leukocytosis and vasculitis of the left common carotid artery.  Brain MRI done yesterday looked normal.  Patient was started on corticosteroids and referred to rheumatology ASAP.  Patient feels better.  Here today for follow-up.  Blood work reviewed.  BMP suggestive of RTA.  Autoimmune disease very likely.  HPI   Prior to Admission medications   Medication Sig Start Date End Date Taking? Authorizing Provider  aspirin EC 81 MG EC tablet Take 1 tablet (81 mg total) by mouth daily. 08/13/17  Yes Reyne Dumas, MD  pantoprazole (PROTONIX) 40 MG tablet Take 1 tablet (40 mg total) by mouth daily. 08/13/17 08/13/18 Yes Reyne Dumas, MD  predniSONE (DELTASONE) 20 MG tablet Take 3 tablets (60 mg total) by mouth daily with breakfast. 08/13/17  Yes Reyne Dumas, MD  traMADol (ULTRAM) 50 MG tablet Take 1 tablet (50 mg total) by mouth every 6 (six) hours as needed for moderate pain or severe pain. 08/13/17  Yes Reyne Dumas, MD  azithromycin (ZITHROMAX) 250 MG tablet Sig as indicated Patient not taking: Reported on 08/11/2017 07/31/17   Horald Pollen, MD  linaclotide [facility]) [place] capsule [place] BREAKFAST Patient not taking: Reported on 08/02/2017 05/20/17   Doran Stabler, MD    Allergies  Allergen Reactions  . Penicillins Hives    Has patient had a PCN reaction causing immediate rash, facial/tongue/throat swelling, SOB or lightheadedness with hypotension: Yes Has patient had a PCN reaction causing severe rash  involving mucus membranes or skin necrosis: Yes Has patient had a PCN reaction that required hospitalization No Has patient had a PCN reaction occurring within the last 10 years: No  If all of the above answers are "NO", then may proceed with Cephalosporin use.     Patient Active Problem List   Diagnosis Date Noted  . Hypotension 08/11/2017  . Vomiting 08/11/2017  . Hypokalemia 08/11/2017  . Vasculitis ([place]) 08/11/2017  . Neck pain 07/31/2017  . Acute thyroiditis 07/31/2017  . Pelvic pain in female 07/05/2015  . Syncope 05/11/2012  . Altered mental state 05/11/2012  . Seizure ([place]) 04/30/2012  . Depression with anxiety 03/24/2012  . Varicose veins of lower extremities with other complications 36/64/4034    Past Medical History:  Diagnosis Date  . Anxiety   . Depression   . H. pylori infection   . History of concussion    2013  . History of Mallory-Weiss syndrome   . History of panic attacks   . History of seizure    05-01-2012--  idiology, xanax withdrawal seizure  . Severe dysmenorrhea   . Varicose veins    bilateral legs    Past Surgical History:  Procedure Laterality Date  . BUNIONECTOMY Bilateral 2009  . CARDIOVASCULAR STRESS TEST  09-25-2012   normal nuclear study/  no ischemia/  normal LV function and wall motion , ef 63%  . LAPAROSCOPIC ASSISTED VAGINAL HYSTERECTOMY Bilateral 07/05/2015   Procedure: LAPAROSCOPIC ASSISTED VAGINAL HYSTERECTOMY, bilateral salpingectomy;  Surgeon: Molli Posey, MD;  Location: [facility];  Service: Gynecology;  Laterality: Bilateral;  . LAPAROSCOPIC OVARIAN CYSTECTOMY  2002  . TRANSTHORACIC ECHOCARDIOGRAM  09-25-2012   dr berry   normal perfusion study/  no ischemia/  normal LV function and wall motion, ef 63%  . VARICOSE VEIN SURGERY  06-07-2011   Laser right greater SV    Social History   Socioeconomic History  . Marital status: Married    Spouse name: Not on file  . Number of children: 1  . Years of  education: Not on file  . Highest education level: Not on file  Social Needs  . Financial resource strain: Not on file  . Food insecurity - worry: Not on file  . Food insecurity - inability: Not on file  . Transportation needs - medical: Not on file  . Transportation needs - non-medical: Not on file  Occupational History  . Not on file  Tobacco Use  . Smoking status: Current Some Day Smoker    Packs/day: 0.10    Years: 10.00    Pack years: 1.00    Types: Cigarettes  . Smokeless tobacco: Never Used  . Tobacco comment: 3-4 cig per day  Substance and Sexual Activity  . Alcohol use: No    Comment: occasional  . Drug use: No  . Sexual activity: Yes    Birth control/protection: None  Other Topics Concern  . Not on file  Social History Narrative  . Not on file    Family History  Problem Relation Age of Onset  . Ulcers Father   . Ulcers Son   . Colitis Son      Review of Systems  Constitutional: Negative.  Negative for chills and fever.  HENT: Negative.  Negative for congestion, nosebleeds and sinus pain.   Eyes: Negative.  Negative for blurred vision and double vision.  Respiratory: Negative.  Negative for cough and shortness of breath.   Cardiovascular: Negative.  Negative for chest pain and leg swelling.  Gastrointestinal: Negative.  Negative for abdominal pain, diarrhea, nausea and vomiting.  Genitourinary: Negative.  Negative for dysuria and hematuria.  Musculoskeletal: Positive for neck pain. Negative for back pain and myalgias.  Skin: Negative.  Negative for rash.  Neurological: Negative.  Negative for dizziness and headaches.  Endo/Heme/Allergies: Negative.   All other systems reviewed and are negative.     Vitals:   08/14/17 1436  BP: 136/80  Pulse: (!) 59  Resp: 16  Temp: 98.7 F (37.1 C)  SpO2: 99%    Physical Exam  Constitutional: She is oriented to person, place, and time. She appears well-developed and well-nourished.  HENT:  Head:  Normocephalic and atraumatic.  Eyes: EOM are normal. Pupils are equal, round, and reactive to light.  Neck: Normal range of motion. Neck supple.  Cardiovascular: Normal rate and regular rhythm.  Pulmonary/Chest: Effort normal and breath sounds normal.  Musculoskeletal: Normal range of motion.  Neurological: She is alert and oriented to person, place, and time. No sensory deficit. She exhibits normal muscle tone.  Skin: Skin is warm and dry. Capillary refill takes less than 2 seconds. No rash noted.  Psychiatric: She has a normal mood and affect. Her behavior is normal.  Vitals reviewed.  Pertinent labs & imaging results that were available during my care of the patient were reviewed by me and considered in my medical decision making (see chart for details).   ASSESSMENT & PLAN: Taylor Knight was seen today for referral.  Diagnoses and all orders for this visit:  Vasculitis ([place]) Comments: Left common carotid artery Orders: -     ANA,IFA RA Diag Pnl w/rflx Tit/Patn -     Ambulatory referral to Rheumatology    Patient Instructions       IF you received an x-ray today, you will receive an invoice from [facility]. Please contact [facility] at [phone] with questions or concerns regarding your invoice.   IF you received labwork today, you will receive an invoice from [facility]. Please contact [facility] at [phone] with questions or concerns regarding your invoice.   Our billing staff will not be able to assist you with questions regarding bills from these companies.  You will be contacted with the lab results as soon as they are available. The fastest way to get your results is to activate your My Chart account. Instructions are located on the last page of this paperwork. If you have not heard from [place] regarding the results in 2 weeks, please contact this office.    Vasculitis Vasculitis is swelling (inflammation) of the blood vessels. With vasculitis, the blood  vessels can become thick, narrow, scarred, or weak, and enough blood may not be able to flow through them. This can cause damage to the muscles, kidneys, lungs, brain, and other parts of the body. There are many types of vasculitis. Some last only a short time while others last a long time. What are the causes? The exact cause is unknown, but vasculitis can develop when the body's immune system attacks its own blood vessels. This attack can be caused by:  An infection.  An immune system disease, such as lupus, rheumatoid arthritis, or scleroderma.  An allergic reaction to a medicine.  Cancer that affects blood cells, such as leukemia and lymphoma.  What increases the risk?  Being a smoker.  Being under stress.  Having a physical injury. What are the signs or symptoms? Symptoms vary depending on the type of vasculitis you have. Symptoms that are common to all types of vasculitis include:  Fever.  Poor appetite.  Weight loss.  Feeling very tired.  Having aches and pains.  Weakness.  Numbness in an area of your body.  Symptoms for specific types of vasculitis include:  Skin problems, such as sores, spots, or rashes.  Trouble seeing.  Trouble breathing.  Blood in your urine.  Headaches.  Stomach pain.  Stuffy or bloody nose.  How is this diagnosed? Your health care provider will ask about your symptoms and do a physical exam. You may have tests done, such as:  A complete blood count (CBC).  Erythrocyte sedimentation, also called sed rate test.  C-reactive protein (CRP).  Antineutrophil cytoplasmic antibodies (ANCA).  A urine test.  A biopsy of a blood vessel.  A nerve conduction study.  Imaging tests, such as: ? X-rays. ? A CT scan. ? An ultrasound. ? An MRI. ? Angiography.  How is this treated? Treatment will depend on the type of vasculitis you have and how severe it is. Sometimes treatment is not needed. Treatment often  includes:  Medicines.  Physical therapy or occupational therapy. This helps strengthen muscles that were weakened by the disease.  You will need to see your health care provider while you are being treated. During follow-up visits, your health care provider will:  Perform blood tests and bone density tests.  Check your blood pressure and blood sugar.  Check for side effects of any medicines you are taking.  Vasculitis cannot  always be cured. Sometimes symptoms go away but the disease does not (the disease goes in remission). If symptoms return, increased treatment may be needed. Follow these instructions at home:  Take medicines only as directed by your health care provider.  Keep all follow-up visits as directed by your health care provider. This is important.  Exercise. Talk with your health care provider about what exercises are okay for you to do. Usually exercises that increase your heart rate (aerobic exercise), such as walking, are recommended. Aerobic exercise helps control your blood pressure and prevent bone loss.  Follow a healthy diet. Include healthy sources of protein, fruits, vegetables, and whole grains in your diet.  Learn as much as you can about vasculitis, and consider joining a support group. Understanding your condition and talking with others who have it may help you cope. Talk with your health care provider if you feel stressed, anxious, or depressed. Contact a health care provider if:  Your symptoms return, or you have new symptoms.  Your fever, fatigue, headache, or weight loss gets worse.  You have signs of infection, such as fever, warmth, tenderness, redness, or swelling. Get help right away if:  Your vision gets worse.  Your pain does not go away, even after you take pain medicine.  You have chest or stomach pain.  You have trouble breathing.  One side of your face or body suddenly becomes weak or numb.  Your nose bleeds.  There is blood in  your urine. This information is not intended to replace advice given to you by your health care provider. Make sure you discuss any questions you have with your health care provider. Document Released: 03/17/2009 Document Revised: 10/27/2015 Document Reviewed: 07/15/2013 Elsevier Interactive Patient Education  2018 Elsevier Inc.    Agustina Caroli, MD [facility]

## 2017-08-15 ENCOUNTER — Telehealth: Payer: Self-pay | Admitting: Emergency Medicine

## 2017-08-15 NOTE — Telephone Encounter (Signed)
Taylor Knight from [facility] called stating their doctor has reviewed pt's referral and they did not have an available appt but they have put her on an urgent cancellation list and might be able to get her in next week. She stated they have an opening currently and if it is not filled by Tuesday they can get her in, but hopefully they will get her in sooner next week. Do we want to try another Rheumatologist? If so I will call Taylor Knight to take pt off list. Please advise. Thanks!

## 2017-08-16 LAB — ANA,IFA RA DIAG PNL W/RFLX TIT/PATN
ANA Titer 1: NEGATIVE
Cyclic Citrullin Peptide Ab: 4 units (ref 0–19)
Rhuematoid fact SerPl-aCnc: <10 IU/mL (ref 0.0–13.9)

## 2017-08-16 NOTE — Telephone Encounter (Signed)
Left vm for Anderson [place] letting her know we would stick with this plan.

## 2017-08-16 NOTE — Telephone Encounter (Signed)
No.This plan is Ok. Thanks.

## 2017-08-20 ENCOUNTER — Encounter: Payer: Self-pay | Admitting: *Deleted

## 2017-08-20 ENCOUNTER — Other Ambulatory Visit: Payer: Self-pay | Admitting: Rheumatology

## 2017-08-20 DIAGNOSIS — I776 Arteritis, unspecified: Secondary | ICD-10-CM

## 2017-08-21 ENCOUNTER — Other Ambulatory Visit: Payer: Self-pay | Admitting: Otolaryngology

## 2017-08-29 ENCOUNTER — Other Ambulatory Visit: Payer: Self-pay | Admitting: Rheumatology

## 2017-08-29 ENCOUNTER — Ambulatory Visit
Admission: RE | Admit: 2017-08-29 | Discharge: 2017-08-29 | Disposition: A | Discharge status: Home / Self Care | Payer: BLUE CROSS/BLUE SHIELD | Source: Ambulatory Visit | Attending: Rheumatology | Admitting: Rheumatology

## 2017-08-29 DIAGNOSIS — I776 Arteritis, unspecified: Secondary | ICD-10-CM

## 2017-08-29 MED ORDER — IOPAMIDOL (ISOVUE-370) INJECTION 76%
75.0000 mL | Freq: Once | INTRAVENOUS | Status: AC | PRN
  Administered 2017-08-29: 75 mL via INTRAVENOUS

## 2017-08-30 ENCOUNTER — Other Ambulatory Visit: Payer: BLUE CROSS/BLUE SHIELD

## 2017-09-03 ENCOUNTER — Other Ambulatory Visit: Payer: Self-pay

## 2017-09-03 ENCOUNTER — Encounter: Payer: Self-pay | Admitting: Emergency Medicine

## 2017-09-03 ENCOUNTER — Ambulatory Visit (INDEPENDENT_AMBULATORY_CARE_PROVIDER_SITE_OTHER): Payer: BLUE CROSS/BLUE SHIELD | Admitting: Emergency Medicine

## 2017-09-03 VITALS — BP 116/76 | HR 85 | Temp 98.8°F | Resp 16 | Ht 66.25 in | Wt 185.5 lb

## 2017-09-03 DIAGNOSIS — I776 Arteritis, unspecified: Secondary | ICD-10-CM

## 2017-09-03 DIAGNOSIS — M2550 Pain in unspecified joint: Secondary | ICD-10-CM | POA: Diagnosis not present

## 2017-09-03 MED ORDER — HYDROCODONE-ACETAMINOPHEN 5-325 MG PO TABS: 1.0000 | ORAL_TABLET | Freq: Four times a day (QID) | ORAL | 0 refills | Status: DC | PRN

## 2017-09-03 NOTE — Progress Notes (Signed)
Taylor Knight 47 y.o.   Chief Complaint  Patient presents with  . body pain    per patient joint pain x 10 days - getting really bad    HISTORY OF PRESENT ILLNESS: This is a 47 y.o. female was seen by me last 08/14/2017 with a history of vasculitis presenting with left-sided neck pain.  On prednisone, getting tapered down as instructed by rheumatologist.  Down to 30 mg from 60 mg.  Complaining of multiple joint pains for the past 10 days.  No fever or chills.  No other associated symptomatology.  Tried tramadol with little relief.  HPI   Prior to Admission medications   Medication Sig Start Date End Date Taking? Authorizing Provider  aspirin EC 81 MG EC tablet Take 1 tablet (81 mg total) by mouth daily. 08/13/17  Yes Reyne Dumas, MD  linaclotide (LINZESS) 290 MCG CAPS capsule TAKE ONE CAPSULE BY MOUTH DAILY BEFORE BREAKFAST 05/20/17  Yes Danis, Estill Cotta III, MD  pantoprazole (PROTONIX) 40 MG tablet Take 1 tablet (40 mg total) by mouth daily. 08/13/17 08/13/18 Yes Reyne Dumas, MD  predniSONE (DELTASONE) 20 MG tablet Take 3 tablets (60 mg total) by mouth daily with breakfast. 08/13/17  Yes Reyne Dumas, MD  traMADol (ULTRAM) 50 MG tablet Take 1 tablet (50 mg total) by mouth every 6 (six) hours as needed for moderate pain or severe pain. 08/13/17  Yes Reyne Dumas, MD    Allergies  Allergen Reactions  . Penicillins Hives    Has patient had a PCN reaction causing immediate rash, facial/tongue/throat swelling, SOB or lightheadedness with hypotension: Yes Has patient had a PCN reaction causing severe rash involving mucus membranes or skin necrosis: Yes Has patient had a PCN reaction that required hospitalization No Has patient had a PCN reaction occurring within the last 10 years: No  If all of the above answers are "NO", then may proceed with Cephalosporin use.     Patient Active Problem List   Diagnosis Date Noted  . Vasculitis ([place]) 08/11/2017  . Neck pain 07/31/2017  . Pelvic pain  in female 07/05/2015  . Seizure ([place]) 04/30/2012  . Varicose veins of lower extremities with other complications 16/03/9603    Past Medical History:  Diagnosis Date  . Anxiety   . Depression   . H. pylori infection   . History of concussion    2013  . History of Mallory-Weiss syndrome   . History of panic attacks   . History of seizure    05-01-2012--  idiology, xanax withdrawal seizure  . Severe dysmenorrhea   . Varicose veins    bilateral legs    Past Surgical History:  Procedure Laterality Date  . BUNIONECTOMY Bilateral 2009  . CARDIOVASCULAR STRESS TEST  09-25-2012   normal nuclear study/  no ischemia/  normal LV function and wall motion , ef 63%  . LAPAROSCOPIC ASSISTED VAGINAL HYSTERECTOMY Bilateral 07/05/2015   Procedure: LAPAROSCOPIC ASSISTED VAGINAL HYSTERECTOMY, bilateral salpingectomy;  Surgeon: Molli Posey, MD;  Location: [facility];  Service: Gynecology;  Laterality: Bilateral;  . LAPAROSCOPIC OVARIAN CYSTECTOMY  2002  . TRANSTHORACIC ECHOCARDIOGRAM  09-25-2012   dr berry   normal perfusion study/  no ischemia/  normal LV function and wall motion, ef 63%  . VARICOSE VEIN SURGERY  06-07-2011   Laser right greater SV    Social History   Socioeconomic History  . Marital status: Married    Spouse name: Not on file  . Number of children: 1  .  Years of education: Not on file  . Highest education level: Not on file  Occupational History  . Not on file  Social Needs  . Financial resource strain: Not on file  . Food insecurity:    Worry: Not on file    Inability: Not on file  . Transportation needs:    Medical: Not on file    Non-medical: Not on file  Tobacco Use  . Smoking status: Current Some Day Smoker    Packs/day: 0.10    Years: 10.00    Pack years: 1.00    Types: Cigarettes  . Smokeless tobacco: Never Used  . Tobacco comment: 3-4 cig per day  Substance and Sexual Activity  . Alcohol use: No    Comment: occasional  . Drug  use: No  . Sexual activity: Yes    Birth control/protection: None  Lifestyle  . Physical activity:    Days per week: Not on file    Minutes per session: Not on file  . Stress: Not on file  Relationships  . Social connections:    Talks on phone: Not on file    Gets together: Not on file    Attends religious service: Not on file    Active member of club or organization: Not on file    Attends meetings of clubs or organizations: Not on file    Relationship status: Not on file  . Intimate partner violence:    Fear of current or ex partner: Not on file    Emotionally abused: Not on file    Physically abused: Not on file    Forced sexual activity: Not on file  Other Topics Concern  . Not on file  Social History Narrative  . Not on file    Family History  Problem Relation Age of Onset  . Ulcers Father   . Ulcers Son   . Colitis Son      Review of Systems  Constitutional: Positive for malaise/fatigue. Negative for chills, fever and weight loss.  HENT: Negative.  Negative for sore throat.   Eyes: Negative.  Negative for blurred vision and double vision.  Respiratory: Negative.  Negative for cough and shortness of breath.   Cardiovascular: Negative.  Negative for chest pain and palpitations.  Gastrointestinal: Negative.  Negative for abdominal pain, diarrhea, nausea and vomiting.  Genitourinary: Negative.   Musculoskeletal: Positive for back pain and joint pain.  Skin: Negative.  Negative for rash.  Neurological: Negative.  Negative for dizziness and headaches.  Endo/Heme/Allergies: Negative.    Vitals:   09/03/17 0832  BP: 116/76  Pulse: 85  Resp: 16  Temp: 98.8 F (37.1 C)  SpO2: 97%     Physical Exam  Constitutional: She is oriented to person, place, and time. She appears well-developed and well-nourished.  HENT:  Head: Normocephalic and atraumatic.  Nose: Nose normal.  Mouth/Throat: Oropharynx is clear and moist.  Eyes: Pupils are equal, round, and reactive  to light. Conjunctivae and EOM are normal.  Neck: Normal range of motion. Neck supple. Muscular tenderness (trapezius) present.  Cardiovascular: Normal rate, regular rhythm and normal heart sounds.  Pulmonary/Chest: Effort normal and breath sounds normal.  Musculoskeletal: Normal range of motion. She exhibits no edema or tenderness.  No erythema or swelling of the joints.  Neurological: She is alert and oriented to person, place, and time. No sensory deficit. She exhibits normal muscle tone.  Skin: Skin is warm and dry. Capillary refill takes less than 2 seconds. No rash noted.  Psychiatric: She has a normal mood and affect. Her behavior is normal.  Vitals reviewed.   Vasculitis ([place]) Being tapered down from high-dose prednisone.  May be exacerbating her pain.  Tramadol failed.  Will use short-term hydrocodone for pain control.  Has follow-up appointment with rheumatologist next Thursday the fourth.  Etiology of vasculitis still undetermined.  Autoimmune disease most likely.  A total of 25 minutes was spent in the room with the patient, greater than 50% of which was in counseling/coordination of care.  ASSESSMENT & PLAN: Taylor Knight was seen today for body pain.  Diagnoses and all orders for this visit:  Vasculitis ([place])  Arthralgia of multiple sites, bilateral  Other orders -     HYDROcodone-acetaminophen (NORCO) 5-325 MG tablet; Take 1 tablet by mouth every 6 (six) hours as needed for moderate pain.    Patient Instructions       IF you received an x-ray today, you will receive an invoice from [facility]. Please contact [facility] at [phone] with questions or concerns regarding your invoice.   IF you received labwork today, you will receive an invoice from [facility]. Please contact [facility] at [phone] with questions or concerns regarding your invoice.   Our billing staff will not be able to assist you with questions regarding bills from these  companies.  You will be contacted with the lab results as soon as they are available. The fastest way to get your results is to activate your My Chart account. Instructions are located on the last page of this paperwork. If you have not heard from [place] regarding the results in 2 weeks, please contact this office.     Joint Pain Joint pain can be caused by many things. The joint can be bruised, infected, weak from aging, or sore from exercise. The pain will probably go away if you follow your doctor's instructions for home care. If your joint pain continues, more tests may be needed to help find the cause of your condition. Follow these instructions at home: Watch your condition for any changes. Follow these instructions as told to lessen the pain that you are feeling:  Take medicines only as told by your doctor.  Rest the sore joint for as long as told by your doctor. If your doctor tells you to, raise (elevate) the painful joint above the level of your heart while you are sitting or lying down.  Do not do things that cause pain or make the pain worse.  If told, put ice on the painful area: ? Put ice in a plastic bag. ? Place a towel between your skin and the bag. ? Leave the ice on for 20 minutes, 2-3 times per day.  Wear an elastic bandage, splint, or sling as told by your doctor. Loosen the bandage or splint if your fingers or toes lose feeling (become numb) and tingle, or if they turn cold and blue.  Begin exercising or stretching the joint as told by your doctor. Ask your doctor what types of exercise are safe for you.  Keep all follow-up visits as told by your doctor. This is important.  Contact a doctor if:  Your pain gets worse and medicine does not help it.  Your joint pain does not get better in 3 days.  You have more bruising or swelling.  You have a fever.  You lose 10 pounds (4.5 kg) or more without trying. Get help right away if:  You are not able to move the  joint.  Your fingers or toes become numb or they turn cold and blue. This information is not intended to replace advice given to you by your health care provider. Make sure you discuss any questions you have with your health care provider. Document Released: 05/09/2009 Document Revised: 10/27/2015 Document Reviewed: 03/02/2014 Elsevier Interactive Patient Education  2018 Elsevier Inc.      Agustina Caroli, MD [facility]

## 2017-09-03 NOTE — Assessment & Plan Note (Signed)
Being tapered down from high-dose prednisone.  May be exacerbating her pain.  Tramadol failed.  Will use short-term hydrocodone for pain control.  Has follow-up appointment with rheumatologist next Thursday the fourth.  Etiology of vasculitis still undetermined.  Autoimmune disease most likely.

## 2017-09-03 NOTE — Patient Instructions (Addendum)
     IF you received an x-ray today, you will receive an invoice from [facility]. Please contact [facility] at [phone] with questions or concerns regarding your invoice.   IF you received labwork today, you will receive an invoice from [place]. Please contact [facility] at [phone] with questions or concerns regarding your invoice.   Our billing staff will not be able to assist you with questions regarding bills from these companies.  You will be contacted with the lab results as soon as they are available. The fastest way to get your results is to activate your My Chart account. Instructions are located on the last page of this paperwork. If you have not heard from [place] regarding the results in 2 weeks, please contact this office.     Joint Pain Joint pain can be caused by many things. The joint can be bruised, infected, weak from aging, or sore from exercise. The pain will probably go away if you follow your doctor's instructions for home care. If your joint pain continues, more tests may be needed to help find the cause of your condition. Follow these instructions at home: Watch your condition for any changes. Follow these instructions as told to lessen the pain that you are feeling:  Take medicines only as told by your doctor.  Rest the sore joint for as long as told by your doctor. If your doctor tells you to, raise (elevate) the painful joint above the level of your heart while you are sitting or lying down.  Do not do things that cause pain or make the pain worse.  If told, put ice on the painful area: ? Put ice in a plastic bag. ? Place a towel between your skin and the bag. ? Leave the ice on for 20 minutes, 2-3 times per day.  Wear an elastic bandage, splint, or sling as told by your doctor. Loosen the bandage or splint if your fingers or toes lose feeling (become numb) and tingle, or if they turn cold and blue.  Begin exercising or stretching the  joint as told by your doctor. Ask your doctor what types of exercise are safe for you.  Keep all follow-up visits as told by your doctor. This is important.  Contact a doctor if:  Your pain gets worse and medicine does not help it.  Your joint pain does not get better in 3 days.  You have more bruising or swelling.  You have a fever.  You lose 10 pounds (4.5 kg) or more without trying. Get help right away if:  You are not able to move the joint.  Your fingers or toes become numb or they turn cold and blue. This information is not intended to replace advice given to you by your health care provider. Make sure you discuss any questions you have with your health care provider. Document Released: 05/09/2009 Document Revised: 10/27/2015 Document Reviewed: 03/02/2014 Elsevier Interactive Patient Education  [employer].

## 2017-12-09 ENCOUNTER — Encounter: Payer: Self-pay | Admitting: Emergency Medicine

## 2017-12-10 ENCOUNTER — Encounter: Payer: Self-pay | Admitting: Emergency Medicine

## 2017-12-10 ENCOUNTER — Ambulatory Visit (INDEPENDENT_AMBULATORY_CARE_PROVIDER_SITE_OTHER): Payer: BLUE CROSS/BLUE SHIELD | Admitting: Emergency Medicine

## 2017-12-10 ENCOUNTER — Ambulatory Visit (INDEPENDENT_AMBULATORY_CARE_PROVIDER_SITE_OTHER): Payer: BLUE CROSS/BLUE SHIELD

## 2017-12-10 ENCOUNTER — Other Ambulatory Visit: Payer: Self-pay

## 2017-12-10 VITALS — BP 123/59 | HR 82 | Temp 98.8°F | Resp 16 | Ht 66.25 in | Wt 185.2 lb

## 2017-12-10 DIAGNOSIS — I776 Arteritis, unspecified: Secondary | ICD-10-CM

## 2017-12-10 DIAGNOSIS — R05 Cough: Secondary | ICD-10-CM

## 2017-12-10 DIAGNOSIS — R059 Cough, unspecified: Secondary | ICD-10-CM

## 2017-12-10 DIAGNOSIS — J22 Unspecified acute lower respiratory infection: Secondary | ICD-10-CM | POA: Insufficient documentation

## 2017-12-10 MED ORDER — PROMETHAZINE-CODEINE 6.25-10 MG/5ML PO SYRP: 5.0000 mL | ORAL_SOLUTION | Freq: Every evening | ORAL | 0 refills | Status: DC | PRN

## 2017-12-10 MED ORDER — AZITHROMYCIN 250 MG PO TABS: ORAL_TABLET | ORAL | 0 refills | Status: DC

## 2017-12-10 MED ORDER — BENZONATATE 200 MG PO CAPS: 200.0000 mg | ORAL_CAPSULE | Freq: Two times a day (BID) | ORAL | 0 refills | Status: DC | PRN

## 2017-12-10 NOTE — Progress Notes (Signed)
Taylor Knight 47 y.o.   Chief Complaint  Patient presents with  . Cough    x 2 months on/off and per patient wants labwork to check liver, patient having problem with pain left arm vein    HISTORY OF PRESENT ILLNESS: This is a 47 y.o. female complaining of cough for 2 months.  At times dry at times productive of mucus.  Denies difficulty breathing but sometimes at night she can hear some wheezing.  No history of asthma or COPD.  Occasional social smoker.  Recent chest x-ray done last March read as normal.  Denies chest pain or pressure.  Not diabetic.  No recent traveling.  Earlier this year diagnosed with vasculitis after she presented with left-sided neck pain.  Doing better.  Seen by rheumatologist couple months ago and liver panel showed 1 elevated liver enzyme.  No history of GERD.  Denies heartburn.  Says that she has a history of almost yearly bronchitis/pneumonia.  No occupational exposure.  Has tried cough drops and ibuprofen. Also complaining of mild discomfort to left antecubital vein, vein that has been frequently used for phlebotomy during the past few months.  Denies any other significant symptoms.  HPI   Prior to Admission medications   Medication Sig Start Date End Date Taking? Authorizing Provider  aspirin EC 81 MG EC tablet Take 1 tablet (81 mg total) by mouth daily. Patient not taking: Reported on 12/10/2017 08/13/17   Reyne Dumas, MD  HYDROcodone-acetaminophen (NORCO) 5-325 MG tablet Take 1 tablet by mouth every 6 (six) hours as needed for moderate pain. Patient not taking: Reported on 12/10/2017 09/03/17   Horald Pollen, MD  linaclotide [facility]) [place] capsule TAKE ONE CAPSULE BY MOUTH DAILY BEFORE BREAKFAST Patient not taking: Reported on 12/10/2017 05/20/17   Doran Stabler, MD  pantoprazole (PROTONIX) 40 MG tablet Take 1 tablet (40 mg total) by mouth daily. Patient not taking: Reported on 12/10/2017 08/13/17 08/13/18  Reyne Dumas, MD  predniSONE (DELTASONE)  20 MG tablet Take 3 tablets (60 mg total) by mouth daily with breakfast. Patient not taking: Reported on 12/10/2017 08/13/17   Reyne Dumas, MD  traMADol (ULTRAM) 50 MG tablet Take 1 tablet (50 mg total) by mouth every 6 (six) hours as needed for moderate pain or severe pain. Patient not taking: Reported on 12/10/2017 08/13/17   Reyne Dumas, MD    Allergies  Allergen Reactions  . Penicillins Hives    Has patient had a PCN reaction causing immediate rash, facial/tongue/throat swelling, SOB or lightheadedness with hypotension: Yes Has patient had a PCN reaction causing severe rash involving mucus membranes or skin necrosis: Yes Has patient had a PCN reaction that required hospitalization No Has patient had a PCN reaction occurring within the last 10 years: No  If all of the above answers are "NO", then may proceed with Cephalosporin use.     Patient Active Problem List   Diagnosis Date Noted  . Vasculitis ([place]) 08/11/2017  . Neck pain 07/31/2017  . Pelvic pain in female 07/05/2015  . Seizure ([place]) 04/30/2012  . Varicose veins of lower extremities with other complications 35/59/7416    Past Medical History:  Diagnosis Date  . Anxiety   . Depression   . H. pylori infection   . History of concussion    2013  . History of Mallory-Weiss syndrome   . History of panic attacks   . History of seizure    05-01-2012--  idiology, xanax withdrawal seizure  .  Severe dysmenorrhea   . Varicose veins    bilateral legs    Past Surgical History:  Procedure Laterality Date  . BUNIONECTOMY Bilateral 2009  . CARDIOVASCULAR STRESS TEST  09-25-2012   normal nuclear study/  no ischemia/  normal LV function and wall motion , ef 63%  . LAPAROSCOPIC ASSISTED VAGINAL HYSTERECTOMY Bilateral 07/05/2015   Procedure: LAPAROSCOPIC ASSISTED VAGINAL HYSTERECTOMY, bilateral salpingectomy;  Surgeon: Molli Posey, MD;  Location: [facility];  Service: Gynecology;  Laterality: Bilateral;  .  LAPAROSCOPIC OVARIAN CYSTECTOMY  2002  . TRANSTHORACIC ECHOCARDIOGRAM  09-25-2012   dr berry   normal perfusion study/  no ischemia/  normal LV function and wall motion, ef 63%  . VARICOSE VEIN SURGERY  06-07-2011   Laser right greater SV    Social History   Socioeconomic History  . Marital status: Married    Spouse name: Not on file  . Number of children: 1  . Years of education: Not on file  . Highest education level: Not on file  Occupational History  . Not on file  Social Needs  . Financial resource strain: Not on file  . Food insecurity:    Worry: Not on file    Inability: Not on file  . Transportation needs:    Medical: Not on file    Non-medical: Not on file  Tobacco Use  . Smoking status: Current Some Day Smoker    Packs/day: 0.10    Years: 10.00    Pack years: 1.00    Types: Cigarettes  . Smokeless tobacco: Never Used  . Tobacco comment: 3-4 cig per day  Substance and Sexual Activity  . Alcohol use: No    Comment: occasional  . Drug use: No  . Sexual activity: Yes    Birth control/protection: None  Lifestyle  . Physical activity:    Days per week: Not on file    Minutes per session: Not on file  . Stress: Not on file  Relationships  . Social connections:    Talks on phone: Not on file    Gets together: Not on file    Attends religious service: Not on file    Active member of club or organization: Not on file    Attends meetings of clubs or organizations: Not on file    Relationship status: Not on file  . Intimate partner violence:    Fear of current or ex partner: Not on file    Emotionally abused: Not on file    Physically abused: Not on file    Forced sexual activity: Not on file  Other Topics Concern  . Not on file  Social History Narrative  . Not on file    Family History  Problem Relation Age of Onset  . Ulcers Father   . Ulcers Son   . Colitis Son      Review of Systems  Constitutional: Negative.  Negative for chills, fever,  malaise/fatigue and weight loss.  HENT: Negative.  Negative for congestion, ear pain, nosebleeds, sinus pain and sore throat.   Eyes: Negative.  Negative for blurred vision and double vision.  Respiratory: Positive for cough. Negative for hemoptysis, shortness of breath and wheezing.   Cardiovascular: Negative.  Negative for chest pain and palpitations.  Gastrointestinal: Negative.  Negative for abdominal pain, blood in stool, heartburn, melena, nausea and vomiting.  Genitourinary: Negative.  Negative for dysuria and hematuria.  Musculoskeletal: Negative.  Negative for back pain, joint pain, myalgias and neck pain.  Skin: Negative.  Negative for rash.  Neurological: Negative.  Negative for dizziness, sensory change, speech change, focal weakness, weakness and headaches.  Endo/Heme/Allergies: Negative.   All other systems reviewed and are negative.   Vitals:   12/10/17 1033  BP: (!) 123/59  Pulse: 82  Resp: 16  Temp: 98.8 F (37.1 C)  SpO2: 98%    Physical Exam  Constitutional: She is oriented to person, place, and time. She appears well-developed and well-nourished.  HENT:  Head: Normocephalic and atraumatic.  Nose: Nose normal.  Mouth/Throat: Oropharynx is clear and moist.  Eyes: Pupils are equal, round, and reactive to light. Conjunctivae are normal.  Neck: Normal range of motion. Neck supple. No JVD present. No thyromegaly present.  Cardiovascular: Normal rate, regular rhythm and normal heart sounds.  Pulmonary/Chest: Effort normal and breath sounds normal.  Abdominal: Soft. Bowel sounds are normal. She exhibits no distension. There is no tenderness.  Musculoskeletal: Normal range of motion. She exhibits no edema.  Lymphadenopathy:    She has no cervical adenopathy.  Neurological: She is alert and oriented to person, place, and time. No sensory deficit. She exhibits normal muscle tone.  Skin: Skin is warm and dry. Capillary refill takes less than 2 seconds. No rash noted.    Psychiatric: She has a normal mood and affect. Her behavior is normal.  Vitals reviewed.  Dg Chest 2 View  Result Date: 12/10/2017 CLINICAL DATA:  Cough EXAM: CHEST - 2 VIEW COMPARISON:  08/11/2017 FINDINGS: The heart size and mediastinal contours are within normal limits. Both lungs are clear. The visualized skeletal structures are unremarkable. IMPRESSION: No active cardiopulmonary disease. Electronically Signed   By: Kathreen Devoid   On: 12/10/2017 11:26     ASSESSMENT & PLAN:  Naelani was seen today for cough.  Diagnoses and all orders for this visit:  Cough -     CBC with Differential/Platelet -     benzonatate (TESSALON) 200 MG capsule; Take 1 capsule (200 mg total) by mouth 2 (two) times daily as needed for cough. -     promethazine-codeine (PHENERGAN WITH CODEINE) 6.25-10 MG/5ML syrup; Take 5 mLs by mouth at bedtime as needed for cough.  Vasculitis ([place]) -     DG Chest 2 View; Future -     Comprehensive metabolic panel  Lower resp. tract infection -     azithromycin (ZITHROMAX) 250 MG tablet; Sig as indicated    Patient Instructions       IF you received an x-ray today, you will receive an invoice from [facility]. Please contact [facility] at [phone] with questions or concerns regarding your invoice.   IF you received labwork today, you will receive an invoice from [place]. Please contact [facility] at [phone] with questions or concerns regarding your invoice.   Our billing staff will not be able to assist you with questions regarding bills from these companies.  You will be contacted with the lab results as soon as they are available. The fastest way to get your results is to activate your My Chart account. Instructions are located on the last page of this paperwork. If you have not heard from [place] regarding the results in 2 weeks, please contact this office.     Cough, Adult A cough helps to clear your throat and lungs. A cough may last  only 2-3 weeks (acute), or it may last longer than 8 weeks (chronic). Many different things can cause a cough. A cough may be a sign of an  illness or another medical condition. Follow these instructions at home:  Pay attention to any changes in your cough.  Take medicines only as told by your doctor. ? If you were prescribed an antibiotic medicine, take it as told by your doctor. Do not stop taking it even if you start to feel better. ? Talk with your doctor before you try using a cough medicine.  Drink enough fluid to keep your pee (urine) clear or pale yellow.  If the air is dry, use a cold steam vaporizer or humidifier in your home.  Stay away from things that make you cough at work or at home.  If your cough is worse at night, try using extra pillows to raise your head up higher while you sleep.  Do not smoke, and try not to be around smoke. If you need help quitting, ask your doctor.  Do not have caffeine.  Do not drink alcohol.  Rest as needed. Contact a doctor if:  You have new problems (symptoms).  You cough up yellow fluid (pus).  Your cough does not get better after 2-3 weeks, or your cough gets worse.  Medicine does not help your cough and you are not sleeping well.  You have pain that gets worse or pain that is not helped with medicine.  You have a fever.  You are losing weight and you do not know why.  You have night sweats. Get help right away if:  You cough up blood.  You have trouble breathing.  Your heartbeat is very fast. This information is not intended to replace advice given to you by your health care provider. Make sure you discuss any questions you have with your health care provider. Document Released: 02/01/2011 Document Revised: 10/27/2015 Document Reviewed: 07/28/2014 Elsevier Interactive Patient Education  2018 Elsevier Inc.     Agustina Caroli, MD [facility]

## 2017-12-10 NOTE — Patient Instructions (Addendum)
     IF you received an x-ray today, you will receive an invoice from [facility]. Please contact [facility] at [phone] with questions or concerns regarding your invoice.   IF you received labwork today, you will receive an invoice from [facility]. Please contact [facility] at [phone] with questions or concerns regarding your invoice.   Our billing staff will not be able to assist you with questions regarding bills from these companies.  You will be contacted with the lab results as soon as they are available. The fastest way to get your results is to activate your My Chart account. Instructions are located on the last page of this paperwork. If you have not heard from us regarding the results in 2 weeks, please contact this office.     Cough, Adult A cough helps to clear your throat and lungs. A cough may last only 2-3 weeks (acute), or it may last longer than 8 weeks (chronic). Many different things can cause a cough. A cough may be a sign of an illness or another medical condition. Follow these instructions at home:  Pay attention to any changes in your cough.  Take medicines only as told by your doctor. ? If you were prescribed an antibiotic medicine, take it as told by your doctor. Do not stop taking it even if you start to feel better. ? Talk with your doctor before you try using a cough medicine.  Drink enough fluid to keep your pee (urine) clear or pale yellow.  If the air is dry, use a cold steam vaporizer or humidifier in your home.  Stay away from things that make you cough at work or at home.  If your cough is worse at night, try using extra pillows to raise your head up higher while you sleep.  Do not smoke, and try not to be around smoke. If you need help quitting, ask your doctor.  Do not have caffeine.  Do not drink alcohol.  Rest as needed. Contact a doctor if:  You have new problems (symptoms).  You cough up yellow fluid  (pus).  Your cough does not get better after 2-3 weeks, or your cough gets worse.  Medicine does not help your cough and you are not sleeping well.  You have pain that gets worse or pain that is not helped with medicine.  You have a fever.  You are losing weight and you do not know why.  You have night sweats. Get help right away if:  You cough up blood.  You have trouble breathing.  Your heartbeat is very fast. This information is not intended to replace advice given to you by your health care provider. Make sure you discuss any questions you have with your health care provider. Document Released: 02/01/2011 Document Revised: 10/27/2015 Document Reviewed: 07/28/2014 Elsevier Interactive Patient Education  2018 Elsevier Inc.  

## 2017-12-11 ENCOUNTER — Encounter: Payer: Self-pay | Admitting: Emergency Medicine

## 2017-12-11 LAB — CBC WITH DIFFERENTIAL/PLATELET
Basophils Absolute: 0 10*3/uL (ref 0.0–0.2)
Basos: 0 %
EOS (ABSOLUTE): 0.2 10*3/uL (ref 0.0–0.4)
Eos: 1 %
Hematocrit: 42.1 % (ref 34.0–46.6)
Hemoglobin: 14.4 g/dL (ref 11.1–15.9)
Immature Grans (Abs): 0 10*3/uL (ref 0.0–0.1)
Immature Granulocytes: 0 %
Lymphocytes Absolute: 2.3 10*3/uL (ref 0.7–3.1)
Lymphs: 19 %
MCH: 31.1 pg (ref 26.6–33.0)
MCHC: 34.2 g/dL (ref 31.5–35.7)
MCV: 91 fL (ref 79–97)
Monocytes Absolute: 0.6 10*3/uL (ref 0.1–0.9)
Monocytes: 5 %
Neutrophils Absolute: 8.9 10*3/uL — ABNORMAL HIGH (ref 1.4–7.0)
Neutrophils: 75 %
Platelets: 360 10*3/uL (ref 150–450)
RBC: 4.63 x10E6/uL (ref 3.77–5.28)
RDW: 12.6 % (ref 12.3–15.4)
WBC: 12 10*3/uL — ABNORMAL HIGH (ref 3.4–10.8)

## 2017-12-11 LAB — COMPREHENSIVE METABOLIC PANEL
ALT: 17 IU/L (ref 0–32)
AST: 14 IU/L (ref 0–40)
Albumin/Globulin Ratio: 1.7 (ref 1.2–2.2)
Albumin: 4.5 g/dL (ref 3.5–5.5)
Alkaline Phosphatase: 55 IU/L (ref 39–117)
BUN/Creatinine Ratio: 12 (ref 9–23)
BUN: 9 mg/dL (ref 6–24)
Bilirubin Total: 0.4 mg/dL (ref 0.0–1.2)
CO2: 22 mmol/L (ref 20–29)
Calcium: 9.9 mg/dL (ref 8.7–10.2)
Chloride: 104 mmol/L (ref 96–106)
Creatinine, Ser: 0.76 mg/dL (ref 0.57–1.00)
GFR calc Af Amer: 110 mL/min/{1.73_m2} (ref >59)
GFR calc non Af Amer: 96 mL/min/{1.73_m2} (ref >59)
Globulin, Total: 2.6 g/dL (ref 1.5–4.5)
Glucose: 89 mg/dL (ref 65–99)
Potassium: 4.8 mmol/L (ref 3.5–5.2)
Sodium: 140 mmol/L (ref 134–144)
Total Protein: 7.1 g/dL (ref 6.0–8.5)

## 2018-01-22 ENCOUNTER — Encounter: Payer: Self-pay | Admitting: Emergency Medicine

## 2018-01-23 ENCOUNTER — Ambulatory Visit (INDEPENDENT_AMBULATORY_CARE_PROVIDER_SITE_OTHER): Payer: BLUE CROSS/BLUE SHIELD | Admitting: Emergency Medicine

## 2018-01-23 ENCOUNTER — Other Ambulatory Visit: Payer: Self-pay

## 2018-01-23 ENCOUNTER — Encounter: Payer: Self-pay | Admitting: Emergency Medicine

## 2018-01-23 ENCOUNTER — Ambulatory Visit: Payer: BLUE CROSS/BLUE SHIELD | Admitting: Emergency Medicine

## 2018-01-23 VITALS — BP 129/78 | HR 77 | Temp 99.9°F | Resp 16 | Wt 183.6 lb

## 2018-01-23 DIAGNOSIS — I776 Arteritis, unspecified: Secondary | ICD-10-CM

## 2018-01-23 DIAGNOSIS — M542 Cervicalgia: Secondary | ICD-10-CM | POA: Diagnosis not present

## 2018-01-23 MED ORDER — METHYLPREDNISOLONE ACETATE 80 MG/ML IJ SUSP
80.0000 mg | Freq: Once | INTRAMUSCULAR | Status: AC
Start: 2018-01-23 — End: 2018-01-23
  Administered 2018-01-23: 80 mg via INTRAMUSCULAR

## 2018-01-23 MED ORDER — METHYLPREDNISOLONE 4 MG PO TBPK: ORAL_TABLET | ORAL | 1 refills | Status: DC

## 2018-01-23 NOTE — Patient Instructions (Addendum)
If you have lab work done today you will be contacted with your lab results within the next 2 weeks.  If you have not heard from [place] then please contact us. The fastest way to get your results is to register for My Chart.   IF you received an x-ray today, you will receive an invoice from [facility]. Please contact [facility] at [phone] with questions or concerns regarding your invoice.   IF you received labwork today, you will receive an invoice from [place]. Please contact [facility] at [phone] with questions or concerns regarding your invoice.   Our billing staff will not be able to assist you with questions regarding bills from these companies.  You will be contacted with the lab results as soon as they are available. The fastest way to get your results is to activate your My Chart account. Instructions are located on the last page of this paperwork. If you have not heard from [place] regarding the results in 2 weeks, please contact this office.    Vasculitis Vasculitis is swelling (inflammation) of the blood vessels. With vasculitis, the blood vessels can become thick, narrow, scarred, or weak, and enough blood may not be able to flow through them. This can cause damage to the muscles, kidneys, lungs, brain, and other parts of the body. There are many types of vasculitis. Some last only a short time while others last a long time. What are the causes? The exact cause is unknown, but vasculitis can develop when the body's immune system attacks its own blood vessels. This attack can be caused by:  An infection.  An immune system disease, such as lupus, rheumatoid arthritis, or scleroderma.  An allergic reaction to a medicine.  Cancer that affects blood cells, such as leukemia and lymphoma.  What increases the risk?  Being a smoker.  Being under stress.  Having a physical injury. What are the signs or symptoms? Symptoms vary depending on the type of  vasculitis you have. Symptoms that are common to all types of vasculitis include:  Fever.  Poor appetite.  Weight loss.  Feeling very tired.  Having aches and pains.  Weakness.  Numbness in an area of your body.  Symptoms for specific types of vasculitis include:  Skin problems, such as sores, spots, or rashes.  Trouble seeing.  Trouble breathing.  Blood in your urine.  Headaches.  Stomach pain.  Stuffy or bloody nose.  How is this diagnosed? Your health care provider will ask about your symptoms and do a physical exam. You may have tests done, such as:  A complete blood count (CBC).  Erythrocyte sedimentation, also called sed rate test.  C-reactive protein (CRP).  Antineutrophil cytoplasmic antibodies (ANCA).  A urine test.  A biopsy of a blood vessel.  A nerve conduction study.  Imaging tests, such as: ? X-rays. ? A CT scan. ? An ultrasound. ? An MRI. ? Angiography.  How is this treated? Treatment will depend on the type of vasculitis you have and how severe it is. Sometimes treatment is not needed. Treatment often includes:  Medicines.  Physical therapy or occupational therapy. This helps strengthen muscles that were weakened by the disease.  You will need to see your health care provider while you are being treated. During follow-up visits, your health care provider will:  Perform blood tests and bone density tests.  Check your blood pressure and blood sugar.  Check for side effects of any medicines you are taking.  Vasculitis cannot always be cured. Sometimes symptoms go away but the disease does not (the disease goes in remission). If symptoms return, increased treatment may be needed. Follow these instructions at home:  Take medicines only as directed by your health care provider.  Keep all follow-up visits as directed by your health care provider. This is important.  Exercise. Talk with your health care provider about what  exercises are okay for you to do. Usually exercises that increase your heart rate (aerobic exercise), such as walking, are recommended. Aerobic exercise helps control your blood pressure and prevent bone loss.  Follow a healthy diet. Include healthy sources of protein, fruits, vegetables, and whole grains in your diet.  Learn as much as you can about vasculitis, and consider joining a support group. Understanding your condition and talking with others who have it may help you cope. Talk with your health care provider if you feel stressed, anxious, or depressed. Contact a health care provider if:  Your symptoms return, or you have new symptoms.  Your fever, fatigue, headache, or weight loss gets worse.  You have signs of infection, such as fever, warmth, tenderness, redness, or swelling. Get help right away if:  Your vision gets worse.  Your pain does not go away, even after you take pain medicine.  You have chest or stomach pain.  You have trouble breathing.  One side of your face or body suddenly becomes weak or numb.  Your nose bleeds.  There is blood in your urine. This information is not intended to replace advice given to you by your health care provider. Make sure you discuss any questions you have with your health care provider. Document Released: 03/17/2009 Document Revised: 10/27/2015 Document Reviewed: 07/15/2013 Elsevier Interactive Patient Education  [employer].

## 2018-01-23 NOTE — Progress Notes (Signed)
Taylor Knight 47 y.o.   Chief Complaint  Patient presents with  . Neck Pain    and left jaw area since Monday    HISTORY OF PRESENT ILLNESS: This is a 47 y.o. female complaining of pain to the left side of the neck similar to the one that she had on 08/14/2017 when she was diagnosed with vasculitis.  HPI   Prior to Admission medications   Medication Sig Start Date End Date Taking? Authorizing Provider  aspirin EC 81 MG EC tablet Take 1 tablet (81 mg total) by mouth daily. Patient not taking: Reported on 12/10/2017 08/13/17   Reyne Dumas, MD  azithromycin [facility]) 250 MG tablet Sig as indicated Patient not taking: Reported on 01/23/2018 12/10/17   Horald Pollen, MD  benzonatate (TESSALON) 200 MG capsule Take 1 capsule (200 mg total) by mouth 2 (two) times daily as needed for cough. Patient not taking: Reported on 01/23/2018 12/10/17   Horald Pollen, MD  HYDROcodone-acetaminophen [facility]) 5-325 MG tablet Take 1 tablet by mouth every 6 (six) hours as needed for moderate pain. Patient not taking: Reported on 12/10/2017 09/03/17   Horald Pollen, MD  linaclotide [facility]) [place] capsule TAKE ONE CAPSULE BY MOUTH DAILY BEFORE BREAKFAST Patient not taking: Reported on 12/10/2017 05/20/17   Doran Stabler, MD  pantoprazole (PROTONIX) 40 MG tablet Take 1 tablet (40 mg total) by mouth daily. Patient not taking: Reported on 12/10/2017 08/13/17 08/13/18  Reyne Dumas, MD  predniSONE (DELTASONE) 20 MG tablet Take 3 tablets (60 mg total) by mouth daily with breakfast. Patient not taking: Reported on 12/10/2017 08/13/17   Reyne Dumas, MD  promethazine-codeine (PHENERGAN WITH CODEINE) 6.25-10 MG/5ML syrup Take 5 mLs by mouth at bedtime as needed for cough. Patient not taking: Reported on 01/23/2018 12/10/17   Horald Pollen, MD  traMADol (ULTRAM) 50 MG tablet Take 1 tablet (50 mg total) by mouth every 6 (six) hours as needed for moderate pain or severe pain. Patient not taking:  Reported on 12/10/2017 08/13/17   Reyne Dumas, MD    Allergies  Allergen Reactions  . Penicillins Hives    Has patient had a PCN reaction causing immediate rash, facial/tongue/throat swelling, SOB or lightheadedness with hypotension: Yes Has patient had a PCN reaction causing severe rash involving mucus membranes or skin necrosis: Yes Has patient had a PCN reaction that required hospitalization No Has patient had a PCN reaction occurring within the last 10 years: No  If all of the above answers are "NO", then may proceed with Cephalosporin use.     Patient Active Problem List   Diagnosis Date Noted  . Cough 12/10/2017  . Lower resp. tract infection 12/10/2017  . Vasculitis ([place]) 08/11/2017  . Neck pain 07/31/2017  . Pelvic pain in female 07/05/2015  . Seizure ([place]) 04/30/2012  . Varicose veins of lower extremities with other complications 71/69/6789    Past Medical History:  Diagnosis Date  . Anxiety   . Depression   . H. pylori infection   . History of concussion    2013  . History of Mallory-Weiss syndrome   . History of panic attacks   . History of seizure    05-01-2012--  idiology, xanax withdrawal seizure  . Severe dysmenorrhea   . Varicose veins    bilateral legs    Past Surgical History:  Procedure Laterality Date  . BUNIONECTOMY Bilateral 2009  . CARDIOVASCULAR STRESS TEST  09-25-2012   normal nuclear study/  no ischemia/  normal LV function and wall motion , ef 63%  . LAPAROSCOPIC ASSISTED VAGINAL HYSTERECTOMY Bilateral 07/05/2015   Procedure: LAPAROSCOPIC ASSISTED VAGINAL HYSTERECTOMY, bilateral salpingectomy;  Surgeon: Molli Posey, MD;  Location: [place];  Service: Gynecology;  Laterality: Bilateral;  . LAPAROSCOPIC OVARIAN CYSTECTOMY  2002  . TRANSTHORACIC ECHOCARDIOGRAM  09-25-2012   dr berry   normal perfusion study/  no ischemia/  normal LV function and wall motion, ef 63%  . VARICOSE VEIN SURGERY  06-07-2011   Laser right  greater SV    Social History   Socioeconomic History  . Marital status: Married    Spouse name: Not on file  . Number of children: 1  . Years of education: Not on file  . Highest education level: Not on file  Occupational History  . Not on file  Social Needs  . Financial resource strain: Not on file  . Food insecurity:    Worry: Not on file    Inability: Not on file  . Transportation needs:    Medical: Not on file    Non-medical: Not on file  Tobacco Use  . Smoking status: Current Some Day Smoker    Packs/day: 0.10    Years: 10.00    Pack years: 1.00    Types: Cigarettes  . Smokeless tobacco: Never Used  . Tobacco comment: 3-4 cig per day  Substance and Sexual Activity  . Alcohol use: No    Comment: occasional  . Drug use: No  . Sexual activity: Yes    Birth control/protection: None  Lifestyle  . Physical activity:    Days per week: Not on file    Minutes per session: Not on file  . Stress: Not on file  Relationships  . Social connections:    Talks on phone: Not on file    Gets together: Not on file    Attends religious service: Not on file    Active member of club or organization: Not on file    Attends meetings of clubs or organizations: Not on file    Relationship status: Not on file  . Intimate partner violence:    Fear of current or ex partner: Not on file    Emotionally abused: Not on file    Physically abused: Not on file    Forced sexual activity: Not on file  Other Topics Concern  . Not on file  Social History Narrative  . Not on file    Family History  Problem Relation Age of Onset  . Ulcers Father   . Ulcers Son   . Colitis Son      Review of Systems  Constitutional: Negative.  Negative for chills and fever.  HENT: Positive for ear pain. Negative for congestion, hearing loss and sore throat.   Eyes: Negative.  Negative for blurred vision and double vision.  Respiratory: Negative.  Negative for cough and shortness of breath.    Cardiovascular: Negative.  Negative for chest pain and palpitations.  Gastrointestinal: Negative for abdominal pain, nausea and vomiting.  Musculoskeletal: Positive for neck pain.  Skin: Negative.  Negative for rash.  Neurological: Negative for dizziness, sensory change, focal weakness and headaches.  Endo/Heme/Allergies: Negative.     Vitals:   01/23/18 1721  BP: 129/78  Pulse: 77  Resp: 16  Temp: 99.9 F (37.7 C)  SpO2: 98%    Physical Exam  Constitutional: She is oriented to person, place, and time. She appears well-developed and well-nourished.  HENT:  Head: Normocephalic and atraumatic.  Right Ear: External ear normal.  Left Ear: External ear normal.  Nose: Nose normal.  Mouth/Throat: Oropharynx is clear and moist.  Eyes: Pupils are equal, round, and reactive to light. Conjunctivae and EOM are normal.  Neck: Normal range of motion.  Positive tenderness to left side in the area of carotid artery.  Cardiovascular: Normal rate and regular rhythm.  Pulmonary/Chest: Effort normal and breath sounds normal.  Musculoskeletal: Normal range of motion.  Lymphadenopathy:    She has no cervical adenopathy.  Neurological: She is alert and oriented to person, place, and time. No sensory deficit. She exhibits normal muscle tone.  Skin: Skin is warm and dry. Capillary refill takes less than 2 seconds.  Psychiatric: She has a normal mood and affect. Her behavior is normal.  Vitals reviewed.    ASSESSMENT & PLAN: Aamirah was seen today for neck pain.  Diagnoses and all orders for this visit:  Neck pain -     methylPREDNISolone acetate (DEPO-MEDROL) injection 80 mg -     methylPREDNISolone (MEDROL DOSEPAK) 4 MG TBPK tablet; Sig as indicated  Vasculitis ([place]) -     Ambulatory referral to Vascular Surgery -     methylPREDNISolone (MEDROL DOSEPAK) 4 MG TBPK tablet; Sig as indicated   Patient Instructions       If you have lab work done today you will be contacted with your  lab results within the next 2 weeks.  If you have not heard from [place] then please contact us. The fastest way to get your results is to register for My Chart.   IF you received an x-ray today, you will receive an invoice from [facility]. Please contact [facility] at [phone] with questions or concerns regarding your invoice.   IF you received labwork today, you will receive an invoice from [facility]. Please contact [facility] at [phone] with questions or concerns regarding your invoice.   Our billing staff will not be able to assist you with questions regarding bills from these companies.  You will be contacted with the lab results as soon as they are available. The fastest way to get your results is to activate your My Chart account. Instructions are located on the last page of this paperwork. If you have not heard from [place] regarding the results in 2 weeks, please contact this office.    Vasculitis Vasculitis is swelling (inflammation) of the blood vessels. With vasculitis, the blood vessels can become thick, narrow, scarred, or weak, and enough blood may not be able to flow through them. This can cause damage to the muscles, kidneys, lungs, brain, and other parts of the body. There are many types of vasculitis. Some last only a short time while others last a long time. What are the causes? The exact cause is unknown, but vasculitis can develop when the body's immune system attacks its own blood vessels. This attack can be caused by:  An infection.  An immune system disease, such as lupus, rheumatoid arthritis, or scleroderma.  An allergic reaction to a medicine.  Cancer that affects blood cells, such as leukemia and lymphoma.  What increases the risk?  Being a smoker.  Being under stress.  Having a physical injury. What are the signs or symptoms? Symptoms vary depending on the type of vasculitis you have. Symptoms that are common to all types of vasculitis  include:  Fever.  Poor appetite.  Weight loss.  Feeling very tired.  Having aches and pains.  Weakness.  Numbness in an area of your body.  Symptoms for specific types of vasculitis include:  Skin problems, such as sores, spots, or rashes.  Trouble seeing.  Trouble breathing.  Blood in your urine.  Headaches.  Stomach pain.  Stuffy or bloody nose.  How is this diagnosed? Your health care provider will ask about your symptoms and do a physical exam. You may have tests done, such as:  A complete blood count (CBC).  Erythrocyte sedimentation, also called sed rate test.  C-reactive protein (CRP).  Antineutrophil cytoplasmic antibodies (ANCA).  A urine test.  A biopsy of a blood vessel.  A nerve conduction study.  Imaging tests, such as: ? X-rays. ? A CT scan. ? An ultrasound. ? An MRI. ? Angiography.  How is this treated? Treatment will depend on the type of vasculitis you have and how severe it is. Sometimes treatment is not needed. Treatment often includes:  Medicines.  Physical therapy or occupational therapy. This helps strengthen muscles that were weakened by the disease.  You will need to see your health care provider while you are being treated. During follow-up visits, your health care provider will:  Perform blood tests and bone density tests.  Check your blood pressure and blood sugar.  Check for side effects of any medicines you are taking.  Vasculitis cannot always be cured. Sometimes symptoms go away but the disease does not (the disease goes in remission). If symptoms return, increased treatment may be needed. Follow these instructions at home:  Take medicines only as directed by your health care provider.  Keep all follow-up visits as directed by your health care provider. This is important.  Exercise. Talk with your health care provider about what exercises are okay for you to do. Usually exercises that increase your heart rate  (aerobic exercise), such as walking, are recommended. Aerobic exercise helps control your blood pressure and prevent bone loss.  Follow a healthy diet. Include healthy sources of protein, fruits, vegetables, and whole grains in your diet.  Learn as much as you can about vasculitis, and consider joining a support group. Understanding your condition and talking with others who have it may help you cope. Talk with your health care provider if you feel stressed, anxious, or depressed. Contact a health care provider if:  Your symptoms return, or you have new symptoms.  Your fever, fatigue, headache, or weight loss gets worse.  You have signs of infection, such as fever, warmth, tenderness, redness, or swelling. Get help right away if:  Your vision gets worse.  Your pain does not go away, even after you take pain medicine.  You have chest or stomach pain.  You have trouble breathing.  One side of your face or body suddenly becomes weak or numb.  Your nose bleeds.  There is blood in your urine. This information is not intended to replace advice given to you by your health care provider. Make sure you discuss any questions you have with your health care provider. Document Released: 03/17/2009 Document Revised: 10/27/2015 Document Reviewed: 07/15/2013 Elsevier Interactive Patient Education  2018 Elsevier Inc.      Agustina Caroli, MD [facility]

## 2018-02-24 ENCOUNTER — Other Ambulatory Visit: Payer: Self-pay

## 2018-02-24 ENCOUNTER — Ambulatory Visit (HOSPITAL_COMMUNITY)
Admission: RE | Admit: 2018-02-24 | Discharge: 2018-02-24 | Disposition: A | Discharge status: Home / Self Care | Payer: BLUE CROSS/BLUE SHIELD | Source: Ambulatory Visit | Attending: Surgery | Admitting: Surgery

## 2018-02-24 DIAGNOSIS — M542 Cervicalgia: Secondary | ICD-10-CM | POA: Diagnosis not present

## 2018-02-24 DIAGNOSIS — R200 Anesthesia of skin: Secondary | ICD-10-CM | POA: Insufficient documentation

## 2018-02-24 DIAGNOSIS — I6529 Occlusion and stenosis of unspecified carotid artery: Secondary | ICD-10-CM

## 2018-02-24 DIAGNOSIS — I6521 Occlusion and stenosis of right carotid artery: Secondary | ICD-10-CM | POA: Insufficient documentation

## 2018-02-24 DIAGNOSIS — S46819S Strain of other muscles, fascia and tendons at shoulder and upper arm level, unspecified arm, sequela: Secondary | ICD-10-CM

## 2018-02-25 ENCOUNTER — Other Ambulatory Visit: Payer: Self-pay

## 2018-02-25 ENCOUNTER — Encounter: Payer: Self-pay | Admitting: Vascular Surgery

## 2018-02-25 ENCOUNTER — Ambulatory Visit (INDEPENDENT_AMBULATORY_CARE_PROVIDER_SITE_OTHER): Payer: BLUE CROSS/BLUE SHIELD | Admitting: Vascular Surgery

## 2018-02-25 ENCOUNTER — Encounter

## 2018-02-25 VITALS — BP 135/88 | HR 74 | Temp 99.3°F | Resp 16 | Ht 66.0 in | Wt 183.0 lb

## 2018-02-25 DIAGNOSIS — I776 Arteritis, unspecified: Secondary | ICD-10-CM

## 2018-02-25 NOTE — Progress Notes (Signed)
Patient name: Taylor Knight MRN: [id number] DOB: 1971-01-26 Sex: female  REASON FOR CONSULT: Evaluate for carotid stenosis and concern for left common carotid artery vasculitis on remote CTA  HPI: Taylor Knight is a 47 y.o. female with hx anxiety/depression who presents for evaluation of her carotid arteries including a concern for vasculitis around her left common carotid artery on CT from March.  Patient states initially in March 2019 she had some numbness of her tongue with vomiting and becoming pale.  Ultimately it sounds like she presented to the hospital and at that time had some pain around her jawline/neck and with suspicion for thyroiditis.  CT scan was obtained at the hospital and there was initially concern for some inflammation around the common carotid artery on the left.  Most recent CTA scan done at the end of March 2019 showed resolution.  At this time she has had no focal neurologic symptoms in follow-up.  She does describe some headaches that start at the back of her head as well as some blurry vision.  In the past she was on prednisone but states that this made her symptoms worse.  She states she has been seen by rheumatology and states her rheumatologist's thought that her symptoms were unlikely to be consistent with vasculitis.  On chart review ESR and CRP were normal in March 2019.  Past Medical History:  Diagnosis Date  . Anxiety   . Depression   . H. pylori infection   . History of concussion    2013  . History of Mallory-Weiss syndrome   . History of panic attacks   . History of seizure    05-01-2012--  idiology, xanax withdrawal seizure  . Severe dysmenorrhea   . Varicose veins    bilateral legs    Past Surgical History:  Procedure Laterality Date  . BUNIONECTOMY Bilateral 2009  . CARDIOVASCULAR STRESS TEST  09-25-2012   normal nuclear study/  no ischemia/  normal LV function and wall motion , ef 63%  . LAPAROSCOPIC ASSISTED VAGINAL HYSTERECTOMY Bilateral 07/05/2015    Procedure: LAPAROSCOPIC ASSISTED VAGINAL HYSTERECTOMY, bilateral salpingectomy;  Surgeon: Molli Posey, MD;  Location: [place];  Service: Gynecology;  Laterality: Bilateral;  . LAPAROSCOPIC OVARIAN CYSTECTOMY  2002  . TRANSTHORACIC ECHOCARDIOGRAM  09-25-2012   dr berry   normal perfusion study/  no ischemia/  normal LV function and wall motion, ef 63%  . VARICOSE VEIN SURGERY  06-07-2011   Laser right greater SV    Family History  Problem Relation Age of Onset  . Ulcers Father   . Ulcers Son   . Colitis Son     SOCIAL HISTORY: Social History   Socioeconomic History  . Marital status: Married    Spouse name: Not on file  . Number of children: 1  . Years of education: Not on file  . Highest education level: Not on file  Occupational History  . Not on file  Social Needs  . Financial resource strain: Not on file  . Food insecurity:    Worry: Not on file    Inability: Not on file  . Transportation needs:    Medical: Not on file    Non-medical: Not on file  Tobacco Use  . Smoking status: Current Some Day Smoker    Packs/day: 0.10    Years: 10.00    Pack years: 1.00    Types: Cigarettes  . Smokeless tobacco: Never Used  . Tobacco comment: 3-4 cig per day  or less  Substance and Sexual Activity  . Alcohol use: No    Comment: occasional  . Drug use: No  . Sexual activity: Yes    Birth control/protection: None  Lifestyle  . Physical activity:    Days per week: Not on file    Minutes per session: Not on file  . Stress: Not on file  Relationships  . Social connections:    Talks on phone: Not on file    Gets together: Not on file    Attends religious service: Not on file    Active member of club or organization: Not on file    Attends meetings of clubs or organizations: Not on file    Relationship status: Not on file  . Intimate partner violence:    Fear of current or ex partner: Not on file    Emotionally abused: Not on file    Physically  abused: Not on file    Forced sexual activity: Not on file  Other Topics Concern  . Not on file  Social History Narrative  . Not on file    Allergies  Allergen Reactions  . Penicillins Hives    Has patient had a PCN reaction causing immediate rash, facial/tongue/throat swelling, SOB or lightheadedness with hypotension: Yes Has patient had a PCN reaction causing severe rash involving mucus membranes or skin necrosis: Yes Has patient had a PCN reaction that required hospitalization No Has patient had a PCN reaction occurring within the last 10 years: No  If all of the above answers are "NO", then may proceed with Cephalosporin use.     Current Outpatient Medications  Medication Sig Dispense Refill  . aspirin EC 81 MG EC tablet Take 1 tablet (81 mg total) by mouth daily. 60 tablet 1  . azithromycin (ZITHROMAX) 250 MG tablet Sig as indicated 6 tablet 0  . benzonatate (TESSALON) 200 MG capsule Take 1 capsule (200 mg total) by mouth 2 (two) times daily as needed for cough. 20 capsule 0  . HYDROcodone-acetaminophen (NORCO) 5-325 MG tablet Take 1 tablet by mouth every 6 (six) hours as needed for moderate pain. 12 tablet 0  . linaclotide (LINZESS) 290 MCG CAPS capsule TAKE ONE CAPSULE BY MOUTH DAILY BEFORE BREAKFAST 30 capsule 4  . methylPREDNISolone (MEDROL DOSEPAK) 4 MG TBPK tablet Sig as indicated 21 tablet 1  . pantoprazole (PROTONIX) 40 MG tablet Take 1 tablet (40 mg total) by mouth daily. 30 tablet 1  . predniSONE (DELTASONE) 20 MG tablet Take 3 tablets (60 mg total) by mouth daily with breakfast. 90 tablet 1  . promethazine-codeine (PHENERGAN WITH CODEINE) 6.25-10 MG/5ML syrup Take 5 mLs by mouth at bedtime as needed for cough. 120 mL 0  . traMADol (ULTRAM) 50 MG tablet Take 1 tablet (50 mg total) by mouth every 6 (six) hours as needed for moderate pain or severe pain. 30 tablet 0   Current Facility-Administered Medications  Medication Dose Route Frequency Provider Last Rate Last Dose    . 0.9 %  sodium chloride infusion  500 mL Intravenous Continuous Danis, Estill Cotta III, MD        REVIEW OF SYSTEMS:  '[X]'  denotes positive finding, '[ ]'  denotes negative finding Cardiac  Comments:  Chest pain or chest pressure:    Shortness of breath upon exertion:    Short of breath when lying flat:    Irregular heart rhythm:        Vascular    Pain in calf, thigh, or hip brought  on by ambulation:    Pain in feet at night that wakes you up from your sleep:     Blood clot in your veins:    Leg swelling:  x       Pulmonary    Oxygen at home:    Productive cough:     Wheezing:         Neurologic    Sudden weakness in arms or legs:     Sudden numbness in arms or legs:     Sudden onset of difficulty speaking or slurred speech:    Temporary loss of vision in one eye:     Problems with dizziness:         Gastrointestinal    Blood in stool:     Vomited blood:         Genitourinary    Burning when urinating:     Blood in urine:        Psychiatric    Major depression:         Hematologic    Bleeding problems:    Problems with blood clotting too easily:        Skin    Rashes or ulcers:        Constitutional    Headache: x     PHYSICAL EXAM: Vitals:   02/25/18 0918 02/25/18 0922  BP: 139/82 135/88  Pulse: 74   Resp: 16   Temp: 99.3 F (37.4 C)   TempSrc: Oral   SpO2: 98%   Weight: 83 kg   Height: '5\' 6"'  (1.676 m)     GENERAL: The patient is a well-nourished female, in no acute distress. The vital signs are documented above. CARDIAC: There is a regular rate and rhythm.  VASCULAR:  2+ radial palpable BUE 2+ femoral palpable bilateral groins 2+ DP palpable BLE No carotid bruit on exam PULMONARY: There is good air exchange bilaterally without wheezing or rales. ABDOMEN: Soft and non-tender with normal pitched bowel sounds.  MUSCULOSKELETAL: There are no major deformities or cyanosis. NEUROLOGIC: No focal weakness or paresthesias are detected. SKIN: There are  no ulcers or rashes noted. PSYCHIATRIC: The patient has a normal affect.  DATA:   I independently reviewed her CTA neck from 08/29/2017 and on my review I do not see any significant inflammation around the left carotid artery.  She has no significant atherosclerotic disease in either carotid bifurcation and the ICAs appear widely patent.  Personally reviewed her carotid duplex which shows no evidence of stenosis in the left ICA and only some mild disease in the right ICA.  Assessment/Plan:  47 year old female referred for evaluation of carotid artery disease with previous suspicion for vasculitis given some remote inflammation around left CCA.  On my review of her CTA from March 2019, I see no evidence of inflammation around the left common carotid artery.  This previous finding appears to be resolved.  Moreover on her duplex today she has no significant evidence of carotid artery disease.  I suggested that she have an appropriate work-up by rheumatology which she states has already been completed and per the patient her rheumatologist does not feel her symptoms are consistent with vasculitis.  I explained today that I see no evidence on her CTA or duplex to suggest that she needs any intervention from vascular surgery.  I do not think she needs regular surveillance for her carotid disease either given no significant findings.  I offered her follow-up as needed in the future.  Marty Heck, MD [facility] Office: [phone] Pager: [place]

## 2018-03-25 ENCOUNTER — Encounter (HOSPITAL_COMMUNITY): Payer: BLUE CROSS/BLUE SHIELD

## 2018-03-25 ENCOUNTER — Encounter: Payer: BLUE CROSS/BLUE SHIELD | Admitting: Vascular Surgery

## 2018-08-10 ENCOUNTER — Other Ambulatory Visit: Payer: Self-pay | Admitting: Gastroenterology

## 2018-08-11 NOTE — Telephone Encounter (Signed)
Incoming Rx request for Linzess 267mcg one every other day. Last seen 08-2016. No current follow up. Please advise

## 2018-08-12 NOTE — Telephone Encounter (Signed)
Left a detailed voicemail for the patient with instructions from Dr Loletha Carrow.

## 2018-08-12 NOTE — Telephone Encounter (Signed)
Hasn't been seen for 2 years. Needs to be seen in clinic to discuss condition if meds to be prescribed. Can also see PCP if more convenient and they are agreeable to managing it.

## 2018-09-03 ENCOUNTER — Telehealth: Payer: BLUE CROSS/BLUE SHIELD | Admitting: Nurse Practitioner

## 2018-09-03 DIAGNOSIS — R05 Cough: Secondary | ICD-10-CM

## 2018-09-03 DIAGNOSIS — R059 Cough, unspecified: Secondary | ICD-10-CM

## 2018-09-03 NOTE — Progress Notes (Signed)

## 2018-09-09 ENCOUNTER — Telehealth: Payer: BLUE CROSS/BLUE SHIELD | Admitting: Physician Assistant

## 2018-09-09 ENCOUNTER — Ambulatory Visit: Payer: Self-pay | Admitting: Emergency Medicine

## 2018-09-09 DIAGNOSIS — R059 Cough, unspecified: Secondary | ICD-10-CM

## 2018-09-09 DIAGNOSIS — R05 Cough: Secondary | ICD-10-CM

## 2018-09-09 NOTE — Progress Notes (Signed)
E-Visit for Corona Virus Screening  Based on your current symptoms, you may very well have the virus, however your symptoms are mild. Currently, not all patients are being tested. If the symptoms are mild and there is not a known exposure, performing the test is not indicated.  Coronavirus disease 2019 (COVID-19)is a respiratory illness that can spread from person to person. The virus that causes COVID-19 is a new virus that was first identified in the country of [place] but is now found in multiple other countries and has spread to the [place].  Symptoms associated with the virus are mild to severe fever, cough, and shortness of breath. There is currently no vaccine to protect against COVID-19, and there is no specific antiviral treatment for the virus.   To be considered HIGH RISK for Coronavirus (COVID-19), you have to meet the following criteria:  . Traveled to [place], [place], [place], [place] or [place]; or in the [place] to [place], [place], [place], or [place]; and have fever, cough, and shortness of breath within the last 2 weeks of travel OR  . Been in close contact with a person diagnosed with COVID-19 within the last 2 weeks and have fever, cough, and shortness of breath  . IF YOU DO NOT MEET THESE CRITERIA, YOU ARE CONSIDERED LOW RISK FOR COVID-19.   It is vitally important that if you feel that you have an infection such as this virus or any other virus that you stay home and away from places where you may spread it to others.  You should self-quarantine for 14 days if you have symptoms that could potentially be coronavirus and avoid contact with people age 49 and older.     You may take acetaminophen (Tylenol) as needed for fever.   Reduce your risk of any infection by using the same precautions used for avoiding the common cold or flu: Wash your hands often with soap and warm water for at least 20 seconds.  If soap and water are not readily available, use an  alcohol-based hand sanitizer with at least 60% alcohol.  If coughing or sneezing, cover your mouth and nose by coughing or sneezing into the elbow areas of your shirt or coat, into a tissue or into your sleeve (not your hands). Avoid shaking hands with others and consider head nods or verbal greetings only.  Avoid touching your eyes,nose, or mouth with unwashed hands. Avoid close contact with people who are sick. Avoid places or events with large numbers of people in one location, like concerts or sporting events. Carefully consider travel plans you have or are making. If you are planning any travel outside or inside the [place], visit the CDC'sTravelers' Health webpagefor the latest health notices. If you have some symptoms but not all symptoms, continue to monitor at home and seek medical attention if your symptoms worsen. If you are having a medical emergency, call 911.  HOME CARE Only take medications as instructed by your medical team. Drink plenty of fluids and get plenty of rest. A steam or ultrasonic humidifier can help if you have congestion.   GET HELP RIGHT AWAY IF: You develop worsening fever. You become short of breath You cough up blood. Your symptoms become more severe MAKE SURE YOU  Understand these instructions. Will watch your condition. Will get help right away if you are not doing well or get worse.  Your e-visit answers were reviewed by a board certified advanced clinical practitioner to complete your  personal care plan.  Depending on the condition, your plan could have included both over the counter or prescription medications.  If there is a problem please reply once you have received a response from your provider. Your safety is important to [place].  If you have drug allergies check your prescription carefully.    You can use MyChart to ask questions about today's visit, request a non-urgent call back, or ask for a work or school excuse for 24 hours related to this  e-Visit. If it has been greater than 24 hours you will need to follow up with your provider, or enter a new e-Visit to address those concerns. You will get an e-mail in the next two days asking about your experience.  I hope that your e-visit has been valuable and will speed your recovery. Thank you for using e-visits.    ===View-only below this line===   ----- Message -----    From: Horace Porteous    Sent: 09/09/2018 12:10 PM EDT      To: E-Visit Mailing List Subject: E-Visit Submission: CoronaVirus (COVID-19) Screening  E-Visit Submission: CoronaVirus (NTZGY-17) Screening --------------------------------  Question: Do you have any of the following?  Answer:   Cough            Fever  Question: Do you have any of the following additional symptoms?  Answer:   Body aches            Headache  Question: Have you had a fever? Answer:   Yes  Question: If you are running a fever, please type in your temperature reading Answer:   99.8  Question: How long have you had the fever? Answer:   For a few days  Question: Have others in your home or workplace had similar symptoms? Answer:   No  Question: When did your symptoms start? Answer:   09/02/2018  Question: Have you recently visited any of the following countries? Answer:   None of these  Question: If you have traveled anywhere in the last  2 months please document where you have visited: Answer:     Question: Have you recently been around others from these countries or visited these countries who have had coughing or fever? Answer:   No  Question: Have you recently been around anyone who has been diagnosed with Corona virus? Answer:   No  Question: Have you been taking any medications? Answer:   No  Question: If taking medications for these symptoms, please list the names and whether they are helping or not Answer:     Question: Are you treated for any of the following conditions: Asthma, COPD, Diabetes, Renal Failure (on  Dialysis), AIDS, any Neuromuscular disease that effects the clearing of secretions, Heart Failure, or Heart Disease? Answer:   No  Question: Please enter a phone number where you can be reached if we have additional questions about your symptoms Answer:   [phone]  Question: Please list your medication allergies that you may have ? (If 'none' , please list as 'none') Answer:   N/A  Question: Please list any additional comments  Answer:   N/A  Question: Are you pregnant? Answer:   I am confident that I am not pregnant  Question: Are you breastfeeding? Answer:   No  A total of 5-10 minutes was spent evaluating this patients questionnaire and formulating a plan of care.

## 2018-09-09 NOTE — Telephone Encounter (Signed)
Pt. Reports she still has cough, body aches and temp. Of 100 on and off. Has headache as well. Asking about testing and informed pt. States she did a e-visit last week, but was asking for PCP recommendation.The practices are not testing. Spoke with Otila Kluver in the office and they recommend going to the [facility] website for further e-visit. She states they are possibly testing at [facility] site. Pt. Verbalizes understanding.  Answer Assessment - Initial Assessment Questions 1. COVID-19 DIAGNOSIS: "Who made your Coronavirus (COVID-19) diagnosis?" "Was it confirmed by a positive lab test?" If not diagnosed by a HCP, ask "Are there lots of cases (community spread) where you live?" (See public health department website, if unsure)   * MAJOR community spread: high number of cases; numbers of cases are increasing; many people hospitalized.   * MINOR community spread: low number of cases; not increasing; few or no people hospitalized     No direct contact as far as she knows 2. ONSET: "When did the COVID-19 symptoms start?"      Last week 3. WORST SYMPTOM: "What is your worst symptom?" (e.g., cough, fever, shortness of breath, muscle aches)     Cough, fever 100, body aches 4. COUGH: "How bad is the cough?"        Mild 5. FEVER: "Do you have a fever?" If so, ask: "What is your temperature, how was it measured, and when did it start?"     100 6. RESPIRATORY STATUS: "Describe your breathing?" (e.g., shortness of breath, wheezing, unable to speak)      Shortness of breath with exertion 7. BETTER-SAME-WORSE: "Are you getting better, staying the same or getting worse compared to yesterday?"  If getting worse, ask, "In what way?"     Worse 8. HIGH RISK DISEASE: "Do you have any chronic medical problems?" (e.g., asthma, heart or lung disease, weak immune system, etc.)     No 9. PREGNANCY: "Is there any chance you are pregnant?" "When was your last menstrual period?"     No 10. OTHER SYMPTOMS: "Do you have any other  symptoms?"  (e.g., runny nose, headache, sore throat, loss of smell)       Headache  Protocols used: CORONAVIRUS (COVID-19) DIAGNOSED OR SUSPECTED-A-AH

## 2018-09-10 NOTE — Telephone Encounter (Signed)
Pt was evaluated with an e-visit for concerns and symptoms.

## 2018-11-18 ENCOUNTER — Encounter: Payer: Self-pay | Admitting: Family Medicine

## 2018-11-18 ENCOUNTER — Other Ambulatory Visit: Payer: Self-pay

## 2018-11-18 ENCOUNTER — Ambulatory Visit (INDEPENDENT_AMBULATORY_CARE_PROVIDER_SITE_OTHER): Payer: BC Managed Care – PPO | Admitting: Family Medicine

## 2018-11-18 VITALS — BP 146/84 | HR 74 | Temp 98.5°F | Resp 18 | Ht 67.05 in | Wt 168.2 lb

## 2018-11-18 DIAGNOSIS — I10 Essential (primary) hypertension: Secondary | ICD-10-CM | POA: Diagnosis not present

## 2018-11-18 MED ORDER — LISINOPRIL 10 MG PO TABS: 10.0000 mg | ORAL_TABLET | Freq: Every day | ORAL | 3 refills | Status: AC

## 2018-11-18 NOTE — Patient Instructions (Signed)
Take blood pressure first morning Take blood pressure before bedtime Bring to follow up appt

## 2018-11-18 NOTE — Progress Notes (Signed)
Acute Office Visit  Subjective:    Patient ID: Taylor Knight, female    DOB: February 09, 1971, 48 y.o.   MRN: [id number]  Chief Complaint  Patient presents with   Hypertension    HPI Patient is in today for elevated blood pressure at eye doctor.  Pt states headaches in the past.  Pt with no h/o of taking medication for elevated blood pressure. Pt with no HRT -hysterectomy-ovaries remain.   Past Medical History:  Diagnosis Date   Anxiety    Depression    H. pylori infection    History of concussion    2013   History of Mallory-Weiss syndrome    History of panic attacks    History of seizure    05-01-2012--  idiology, xanax withdrawal seizure   Severe dysmenorrhea    Varicose veins    bilateral legs    Past Surgical History:  Procedure Laterality Date   BUNIONECTOMY Bilateral 2009   CARDIOVASCULAR STRESS TEST  09-25-2012   normal nuclear study/  no ischemia/  normal LV function and wall motion , ef 63%   LAPAROSCOPIC ASSISTED VAGINAL HYSTERECTOMY Bilateral 07/05/2015   Procedure: LAPAROSCOPIC ASSISTED VAGINAL HYSTERECTOMY, bilateral salpingectomy;  Surgeon: Molli Posey, MD;  Location: [place];  Service: Gynecology;  Laterality: Bilateral;   LAPAROSCOPIC OVARIAN CYSTECTOMY  2002   TRANSTHORACIC ECHOCARDIOGRAM  09-25-2012   dr berry   normal perfusion study/  no ischemia/  normal LV function and wall motion, ef 63%   VARICOSE VEIN SURGERY  06-07-2011   Laser right greater SV    Family History  Problem Relation Age of Onset   Ulcers Father    Ulcers Son    Colitis Son     Social History   Socioeconomic History   Marital status: Married    Spouse name: Not on file   Number of children: 1   Years of education: Not on file   Highest education level: Not on file  Occupational History   Not on file  Social Needs   Financial resource strain: Not on file   Food insecurity    Worry: Not on file    Inability: Not on file    Transportation needs    Medical: Not on file    Non-medical: Not on file  Tobacco Use   Smoking status: Current Some Day Smoker    Packs/day: 0.10    Years: 10.00    Pack years: 1.00    Types: Cigarettes   Smokeless tobacco: Never Used   Tobacco comment: 3-4 cig per day or less  Substance and Sexual Activity   Alcohol use: Yes    Comment: occasional   Drug use: No   Sexual activity: Yes    Birth control/protection: None  Lifestyle   Physical activity    Days per week: Not on file    Minutes per session: Not on file   Stress: Not on file  Relationships   Social connections    Talks on phone: Not on file    Gets together: Not on file    Attends religious service: Not on file    Active member of club or organization: Not on file    Attends meetings of clubs or organizations: Not on file    Relationship status: Not on file   Intimate partner violence    Fear of current or ex partner: Not on file    Emotionally abused: Not on file    Physically abused: Not on  file    Forced sexual activity: Not on file  Other Topics Concern   Not on file  Social History Narrative   Not on file    Outpatient Medications Prior to Visit  Medication Sig Dispense Refill   aspirin EC 81 MG EC tablet Take 1 tablet (81 mg total) by mouth daily. (Patient not taking: Reported on 11/18/2018) 60 tablet 1   azithromycin (ZITHROMAX) 250 MG tablet Sig as indicated (Patient not taking: Reported on 11/18/2018) 6 tablet 0   benzonatate (TESSALON) 200 MG capsule Take 1 capsule (200 mg total) by mouth 2 (two) times daily as needed for cough. (Patient not taking: Reported on 11/18/2018) 20 capsule 0   HYDROcodone-acetaminophen (NORCO) 5-325 MG tablet Take 1 tablet by mouth every 6 (six) hours as needed for moderate pain. (Patient not taking: Reported on 11/18/2018) 12 tablet 0   linaclotide (LINZESS) 290 MCG CAPS capsule TAKE ONE CAPSULE BY MOUTH DAILY BEFORE BREAKFAST (Patient not taking: Reported  on 11/18/2018) 30 capsule 4   methylPREDNISolone (MEDROL DOSEPAK) 4 MG TBPK tablet Sig as indicated (Patient not taking: Reported on 11/18/2018) 21 tablet 1   pantoprazole (PROTONIX) 40 MG tablet Take 1 tablet (40 mg total) by mouth daily. 30 tablet 1   predniSONE (DELTASONE) 20 MG tablet Take 3 tablets (60 mg total) by mouth daily with breakfast. (Patient not taking: Reported on 11/18/2018) 90 tablet 1   promethazine-codeine (PHENERGAN WITH CODEINE) 6.25-10 MG/5ML syrup Take 5 mLs by mouth at bedtime as needed for cough. (Patient not taking: Reported on 11/18/2018) 120 mL 0   traMADol (ULTRAM) 50 MG tablet Take 1 tablet (50 mg total) by mouth every 6 (six) hours as needed for moderate pain or severe pain. (Patient not taking: Reported on 11/18/2018) 30 tablet 0   Facility-Administered Medications Prior to Visit  Medication Dose Route Frequency Provider Last Rate Last Dose   0.9 %  sodium chloride infusion  500 mL Intravenous Continuous Danis, Estill Cotta III, MD        Allergies  Allergen Reactions   Penicillins Hives    Has patient had a PCN reaction causing immediate rash, facial/tongue/throat swelling, SOB or lightheadedness with hypotension: Yes Has patient had a PCN reaction causing severe rash involving mucus membranes or skin necrosis: Yes Has patient had a PCN reaction that required hospitalization No Has patient had a PCN reaction occurring within the last 10 years: No  If all of the above answers are "NO", then may proceed with Cephalosporin use.     Review of Systems  Constitutional: Positive for malaise/fatigue. Negative for fever and weight loss.  HENT: Negative for congestion, sinus pain and sore throat.   Respiratory: Negative for cough and sputum production.   Cardiovascular: Negative for chest pain.  Neurological: Positive for headaches. Negative for dizziness.   lymph node enlarged on the right over the past days    Objective:    Physical Exam  Constitutional: She  appears well-developed and well-nourished.  HENT:  Head: Normocephalic and atraumatic.  Right Ear: External ear normal.  Left Ear: External ear normal.  Eyes: Pupils are equal, round, and reactive to light.  Neck: Normal range of motion. Neck supple.  Cardiovascular: Normal rate and regular rhythm.  Pulmonary/Chest: Effort normal and breath sounds normal.    BP (!) 146/84    Pulse 74    Temp 98.5 F (36.9 C) (Oral)    Resp 18    Ht 5' 7.05" (1.703 m)    Wt  168 lb 3.2 oz (76.3 kg)    LMP 06/09/2015 (Exact Date)    SpO2 98%    BMI 26.31 kg/m  Wt Readings from Last 3 Encounters:  11/18/18 168 lb 3.2 oz (76.3 kg)  02/25/18 183 lb (83 kg)  01/23/18 183 lb 9.6 oz (83.3 kg)    Health Maintenance Due  Topic Date Due   PAP SMEAR-Modifier  02/01/1994      Lab Results  Component Value Date   TSH 5.262 (H) 08/11/2017   Lab Results  Component Value Date   WBC 12.0 (H) 12/10/2017   HGB 14.4 12/10/2017   HCT 42.1 12/10/2017   MCV 91 12/10/2017   PLT 360 12/10/2017   Lab Results  Component Value Date   NA 140 12/10/2017   K 4.8 12/10/2017   CO2 22 12/10/2017   GLUCOSE 89 12/10/2017   BUN 9 12/10/2017   CREATININE 0.76 12/10/2017   BILITOT 0.4 12/10/2017   ALKPHOS 55 12/10/2017   AST 14 12/10/2017   ALT 17 12/10/2017   PROT 7.1 12/10/2017   ALBUMIN 4.5 12/10/2017   CALCIUM 9.9 12/10/2017   ANIONGAP 7 08/12/2017   No results found for: CHOL No results found for: HDL No results found for: LDLCALC No results found for: TRIG No results found for: CHOLHDL No results found for: HGBA1C     Assessment & Plan:   Problem List Items Addressed This Visit    None    1. Hypertension, unspecified type Lisinopril 47m-rx Risk/benefit/side effect d/w pt-no tob/caffeine/nicotine-limit stress, increase exercise - EKG 12-Lead - CBC with Differential/Platelet - CMP14+EGFR - TSH  Geneive Sandstrom LHannah Beat MD

## 2018-11-19 LAB — CMP14+EGFR
ALT: 13 IU/L (ref 0–32)
AST: 17 IU/L (ref 0–40)
Albumin/Globulin Ratio: 2.3 — ABNORMAL HIGH (ref 1.2–2.2)
Albumin: 4.9 g/dL — ABNORMAL HIGH (ref 3.8–4.8)
Alkaline Phosphatase: 56 IU/L (ref 39–117)
BUN/Creatinine Ratio: 9 (ref 9–23)
BUN: 7 mg/dL (ref 6–24)
Bilirubin Total: 0.4 mg/dL (ref 0.0–1.2)
CO2: 20 mmol/L (ref 20–29)
Calcium: 9.8 mg/dL (ref 8.7–10.2)
Chloride: 101 mmol/L (ref 96–106)
Creatinine, Ser: 0.77 mg/dL (ref 0.57–1.00)
GFR calc Af Amer: 108 mL/min/{1.73_m2} (ref >59)
GFR calc non Af Amer: 94 mL/min/{1.73_m2} (ref >59)
Globulin, Total: 2.1 g/dL (ref 1.5–4.5)
Glucose: 79 mg/dL (ref 65–99)
Potassium: 4.3 mmol/L (ref 3.5–5.2)
Sodium: 139 mmol/L (ref 134–144)
Total Protein: 7 g/dL (ref 6.0–8.5)

## 2018-11-19 LAB — CBC WITH DIFFERENTIAL/PLATELET
Basophils Absolute: 0.1 10*3/uL (ref 0.0–0.2)
Basos: 1 %
EOS (ABSOLUTE): 0.2 10*3/uL (ref 0.0–0.4)
Eos: 2 %
Hematocrit: 42.6 % (ref 34.0–46.6)
Hemoglobin: 14.9 g/dL (ref 11.1–15.9)
Immature Grans (Abs): 0 10*3/uL (ref 0.0–0.1)
Immature Granulocytes: 0 %
Lymphocytes Absolute: 3 10*3/uL (ref 0.7–3.1)
Lymphs: 22 %
MCH: 31.8 pg (ref 26.6–33.0)
MCHC: 35 g/dL (ref 31.5–35.7)
MCV: 91 fL (ref 79–97)
Monocytes Absolute: 0.6 10*3/uL (ref 0.1–0.9)
Monocytes: 4 %
Neutrophils Absolute: 9.8 10*3/uL — ABNORMAL HIGH (ref 1.4–7.0)
Neutrophils: 71 %
Platelets: 385 10*3/uL (ref 150–450)
RBC: 4.69 x10E6/uL (ref 3.77–5.28)
RDW: 11.7 % (ref 11.7–15.4)
WBC: 13.7 10*3/uL — ABNORMAL HIGH (ref 3.4–10.8)

## 2018-11-19 LAB — TSH: TSH: 1.29 u[IU]/mL (ref 0.450–4.500)

## 2018-11-19 LAB — MICROALBUMIN, URINE: Microalbumin, Urine: 9.6 ug/mL

## 2018-11-20 ENCOUNTER — Other Ambulatory Visit: Payer: Self-pay | Admitting: Family Medicine

## 2018-11-20 DIAGNOSIS — D72829 Elevated white blood cell count, unspecified: Secondary | ICD-10-CM

## 2018-11-24 ENCOUNTER — Telehealth: Payer: Self-pay | Admitting: Family Medicine

## 2018-11-24 NOTE — Telephone Encounter (Signed)
Please send message to Dr. Holly Bodily.  Patient was seen by her on 11/20/2018 and found to have leukocytosis.  Thanks.

## 2018-11-24 NOTE — Telephone Encounter (Signed)
Could you place a referral for Hematologist for this pt

## 2018-11-24 NOTE — Telephone Encounter (Signed)
Copied from [place] [id number]. Topic: Referral - Request for Referral >> Nov 24, 2018  1:26 PM Scherrie Gerlach wrote: Has patient seen PCP for this complaint? yes Pt states mychart message states she needs to see hematologist.  But no referral. Pt is off work this week and would like appt asap.

## 2018-11-25 ENCOUNTER — Other Ambulatory Visit: Payer: Self-pay | Admitting: Family Medicine

## 2018-11-25 DIAGNOSIS — D72829 Elevated white blood cell count, unspecified: Secondary | ICD-10-CM

## 2018-11-25 NOTE — Telephone Encounter (Signed)
Yes-referral made

## 2018-11-27 ENCOUNTER — Encounter: Payer: Self-pay | Admitting: Emergency Medicine

## 2018-11-28 ENCOUNTER — Telehealth: Payer: Self-pay | Admitting: Hematology

## 2018-11-28 NOTE — Telephone Encounter (Signed)
lmom for patient to return call to confirm new patient appt 7/24 at 0830

## 2018-12-22 ENCOUNTER — Other Ambulatory Visit: Payer: Self-pay | Admitting: Hematology

## 2018-12-22 DIAGNOSIS — D72829 Elevated white blood cell count, unspecified: Secondary | ICD-10-CM | POA: Insufficient documentation

## 2018-12-22 NOTE — Progress Notes (Signed)
[facility] NOTE  Patient Care Team: Horald Pollen, MD as PCP - General (Internal Medicine)  HEME/ONC OVERVIEW: 1. Chronic intermittent leukocytosis -WBC fluctuates between 9k and 20k since 2013; occasionally accompanied by mild polycythemia   ASSESSMENT & PLAN:   Chronic intermittent leukocytosis  -I reviewed the patient's records in detail, including PCP clinic notes and lab studies dating back to 2013 -In summary, patient has had fluctuating WBC between 9k and 20k since 2013, occasionally accompanied by mild polycythemia.  Platelet count has remained normal. 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 She has not taken steroid recently or received abx for any suspected infection. -Clinically, patient denies any constitutional symptoms or symptoms of infection -WBC 8.8k with normal differential today -I personally reviewed the patient's peripheral blood smear today.  The red blood cells were of normal morphology.  There was no schistocytosis.  The white blood cells were of normal morphology. There were no peripheral circulating blasts. The platelets were of normal size and I verified that there were no platelet clumping. -Given the patient's young age and ongoing tobacco use, I suspect that the leukocytosis is at least in part reactive in the setting of ongoing tobacco use  -While I think primary blood disorder, such as myeloproliferative neoplasm, is very unlikely, she has had chronic intermittent leukocytosis for some years, and is very anxious about the lab abnormality, so I have ordered MPN NGS and BCR/ABL FISH to rule out myeloproliferative neoplasm -In addition, I will also order inflammatory markers, including CRP and ESR, to rule out any chronic inflammation -If the above work-up is negative, I do not think  further work-up is indicated at this time, especially given the normal CBC today -I counseled the patient on the importance of age-appropriate cancer screening, including mammogram, colonoscopy and PAP smear   Transient visual change -Etiology unclear, possibly due to migraine -No recurrence since the 1st episode last week -I encouraged patient to discuss her symptoms further with her PCP, and if she continues to have these symptoms, she may consider requesting a referral to neurology -Unlikely to be related to intermittent leukocytosis  No orders of the defined types were placed in this encounter.  All questions were answered. The patient knows to call the clinic with any problems, questions or concerns.  Return as needed. If MPN work-up shows any abnormality, we will contact the patient and schedule follow-up as indicated.  Tish Men, MD 12/26/2018 9:39 AM   CHIEF COMPLAINTS/PURPOSE OF CONSULTATION:  "I am just here to find out why my WBC is high"  HISTORY OF PRESENTING ILLNESS:  Taylor Knight 48 y.o. female is here because of chronic, intermittent, mild leukocytosis.  Ms. Bartnick reports that she was diagnosed with left carotid artery vasculitis and March 2019, for which she was started on a prolonged course of steroid.  She was referred to rheumatology, who apparently had ordered extensive work-up that was negative.  She was also referred to a vascular surgeon, who did not see any evidence of recurrent vasculitis.  Since then, she has not taken any steroid or being treated recently for any symptoms of infection.  She reports that last week, she was at work when she had a transient episode of flashing lights in her peripheral vision without any associated headache, blurry vision, diplopia, or other focal neurologic symptoms.  Since then,  she has not had any recurrent visual symptoms.  She has not yet discussed this with her PCP.  She currently smokes 1 pack of cigarettes every 3 to 4 days.  She  denies any other complaint today.  I have reviewed her chart and materials related to her cancer extensively and collaborated history with the patient. Summary of oncologic history is as follows: Oncology History   No history exists.    MEDICAL HISTORY:  Past Medical History:  Diagnosis Date  . Anxiety   . Depression   . H. pylori infection   . History of concussion    2013  . History of Mallory-Weiss syndrome   . History of panic attacks   . History of seizure    05-01-2012--  idiology, xanax withdrawal seizure  . Severe dysmenorrhea   . Varicose veins    bilateral legs    SURGICAL HISTORY: Past Surgical History:  Procedure Laterality Date  . BUNIONECTOMY Bilateral 2009  . CARDIOVASCULAR STRESS TEST  09-25-2012   normal nuclear study/  no ischemia/  normal LV function and wall motion , ef 63%  . LAPAROSCOPIC ASSISTED VAGINAL HYSTERECTOMY Bilateral 07/05/2015   Procedure: LAPAROSCOPIC ASSISTED VAGINAL HYSTERECTOMY, bilateral salpingectomy;  Surgeon: Molli Posey, MD;  Location: [facility];  Service: Gynecology;  Laterality: Bilateral;  . LAPAROSCOPIC OVARIAN CYSTECTOMY  2002  . TRANSTHORACIC ECHOCARDIOGRAM  09-25-2012   dr berry   normal perfusion study/  no ischemia/  normal LV function and wall motion, ef 63%  . VARICOSE VEIN SURGERY  06-07-2011   Laser right greater SV    SOCIAL HISTORY: Social History   Socioeconomic History  . Marital status: Married    Spouse name: Not on file  . Number of children: 1  . Years of education: Not on file  . Highest education level: Not on file  Occupational History  . Not on file  Social Needs  . Financial resource strain: Not on file  . Food insecurity    Worry: Not on file    Inability: Not on file  . Transportation needs    Medical: Not on file    Non-medical: Not on file  Tobacco Use  . Smoking status: Current Some Day Smoker    Packs/day: 1.00    Years: 10.00    Pack years: 10.00    Types:  Cigarettes  . Smokeless tobacco: Never Used  . Tobacco comment: 1pack every three days  Substance and Sexual Activity  . Alcohol use: Yes    Comment: occasional  . Drug use: No  . Sexual activity: Yes    Birth control/protection: None  Lifestyle  . Physical activity    Days per week: Not on file    Minutes per session: Not on file  . Stress: Not on file  Relationships  . Social Herbalist on phone: Not on file    Gets together: Not on file    Attends religious service: Not on file    Active member of club or organization: Not on file    Attends meetings of clubs or organizations: Not on file    Relationship status: Not on file  . Intimate partner violence    Fear of current or ex partner: Not on file    Emotionally abused: Not on file    Physically abused: Not on file    Forced sexual activity: Not on file  Other Topics Concern  . Not on file  Social History Narrative  .  Not on file    FAMILY HISTORY: Family History  Problem Relation Age of Onset  . Ulcers Father   . Ulcers Son   . Colitis Son     ALLERGIES:  is allergic to penicillins.  MEDICATIONS:  Current Outpatient Medications  Medication Sig Dispense Refill  . linaclotide (LINZESS) 290 MCG CAPS capsule TAKE ONE CAPSULE BY MOUTH DAILY BEFORE BREAKFAST 30 capsule 4  . lisinopril (ZESTRIL) 10 MG tablet Take 1 tablet (10 mg total) by mouth daily. 90 tablet 3   No current facility-administered medications for this visit.     REVIEW OF SYSTEMS:   Constitutional: ( - ) fevers, ( - )  chills , ( - ) night sweats Eyes: ( - ) blurriness of vision, ( - ) double vision, ( - ) watery eyes Ears, nose, mouth, throat, and face: ( - ) mucositis, ( - ) sore throat Respiratory: ( - ) cough, ( - ) dyspnea, ( - ) wheezes Cardiovascular: ( - ) palpitation, ( - ) chest discomfort, ( - ) lower extremity swelling Gastrointestinal:  ( - ) nausea, ( - ) heartburn, ( - ) change in bowel habits Skin: ( - ) abnormal skin  rashes Lymphatics: ( - ) new lymphadenopathy, ( + ) easy bruising Neurological: ( - ) numbness, ( - ) tingling, ( - ) new weaknesses Behavioral/Psych: ( - ) mood change, ( - ) new changes  All other systems were reviewed with the patient and are negative.  PHYSICAL EXAMINATION: ECOG PERFORMANCE STATUS: 0 - Asymptomatic  Vitals:   12/26/18 0906  BP: 139/79  Pulse: 71  Resp: 18  Temp: 98 F (36.7 C)  SpO2: 100%   Filed Weights   12/26/18 0906  Weight: 170 lb (77.1 kg)    GENERAL: alert, no distress and comfortable SKIN: skin color, texture, turgor are normal, no rashes or significant lesions EYES: conjunctiva are pink and non-injected, sclera clear OROPHARYNX: no exudate, no erythema; lips, buccal mucosa, and tongue normal  NECK: supple, non-tender LYMPH:  no palpable lymphadenopathy in the cervical LUNGS: clear to auscultation with normal breathing effort HEART: regular rate & rhythm, no murmurs, no lower extremity edema ABDOMEN: soft, non-tender, non-distended, normal bowel sounds Musculoskeletal: no cyanosis of digits and no clubbing  PSYCH: alert & oriented x 3, fluent speech NEURO: no focal motor/sensory deficits  LABORATORY DATA:  I have reviewed the data as listed Lab Results  Component Value Date   WBC 8.8 12/26/2018   HGB 14.5 12/26/2018   HCT 42.2 12/26/2018   MCV 91.7 12/26/2018   PLT 286 12/26/2018   Lab Results  Component Value Date   NA 140 12/26/2018   K 4.5 12/26/2018   CL 106 12/26/2018   CO2 26 12/26/2018    PATHOLOGY: I personally reviewed the patient's peripheral blood smear today.  The red blood cells were of normal morphology.  There was no schistocytosis.  The white blood cells were of normal morphology. There were no peripheral circulating blasts. The platelets were of normal size and I verified that there were no platelet clumping.

## 2018-12-26 ENCOUNTER — Other Ambulatory Visit: Payer: Self-pay

## 2018-12-26 ENCOUNTER — Inpatient Hospital Stay: Payer: BC Managed Care – PPO

## 2018-12-26 ENCOUNTER — Inpatient Hospital Stay: Payer: BC Managed Care – PPO | Attending: Hematology | Admitting: Hematology

## 2018-12-26 ENCOUNTER — Encounter: Payer: Self-pay | Admitting: Hematology

## 2018-12-26 VITALS — BP 139/79 | HR 71 | Temp 98.0°F | Resp 18 | Ht 66.0 in | Wt 170.0 lb

## 2018-12-26 DIAGNOSIS — D72829 Elevated white blood cell count, unspecified: Secondary | ICD-10-CM | POA: Diagnosis present

## 2018-12-26 DIAGNOSIS — F17210 Nicotine dependence, cigarettes, uncomplicated: Secondary | ICD-10-CM

## 2018-12-26 DIAGNOSIS — H539 Unspecified visual disturbance: Secondary | ICD-10-CM

## 2018-12-26 LAB — CBC WITH DIFFERENTIAL (CANCER CENTER ONLY)
Abs Immature Granulocytes: 0.03 10*3/uL (ref 0.00–0.07)
Basophils Absolute: 0.1 10*3/uL (ref 0.0–0.1)
Basophils Relative: 1 %
Eosinophils Absolute: 0.4 10*3/uL (ref 0.0–0.5)
Eosinophils Relative: 5 %
HCT: 42.2 % (ref 36.0–46.0)
Hemoglobin: 14.5 g/dL (ref 12.0–15.0)
Immature Granulocytes: 0 %
Lymphocytes Relative: 29 %
Lymphs Abs: 2.6 10*3/uL (ref 0.7–4.0)
MCH: 31.5 pg (ref 26.0–34.0)
MCHC: 34.4 g/dL (ref 30.0–36.0)
MCV: 91.7 fL (ref 80.0–100.0)
Monocytes Absolute: 0.5 10*3/uL (ref 0.1–1.0)
Monocytes Relative: 5 %
Neutro Abs: 5.3 10*3/uL (ref 1.7–7.7)
Neutrophils Relative %: 60 %
Platelet Count: 286 10*3/uL (ref 150–400)
RBC: 4.6 MIL/uL (ref 3.87–5.11)
RDW: 11.6 % (ref 11.5–15.5)
WBC Count: 8.8 10*3/uL (ref 4.0–10.5)
nRBC: 0 % (ref 0.0–0.2)

## 2018-12-26 LAB — LACTATE DEHYDROGENASE: LDH: 149 U/L (ref 98–192)

## 2018-12-26 LAB — CMP (CANCER CENTER ONLY)
ALT: 16 U/L (ref 0–44)
AST: 14 U/L — ABNORMAL LOW (ref 15–41)
Albumin: 4.6 g/dL (ref 3.5–5.0)
Alkaline Phosphatase: 40 U/L (ref 38–126)
Anion gap: 8 (ref 5–15)
BUN: 10 mg/dL (ref 6–20)
CO2: 26 mmol/L (ref 22–32)
Calcium: 8.8 mg/dL — ABNORMAL LOW (ref 8.9–10.3)
Chloride: 106 mmol/L (ref 98–111)
Creatinine: 0.82 mg/dL (ref 0.44–1.00)
GFR, Est AFR Am: >60 mL/min (ref >60)
GFR, Estimated: >60 mL/min (ref >60)
Glucose, Bld: 97 mg/dL (ref 70–99)
Potassium: 4.5 mmol/L (ref 3.5–5.1)
Sodium: 140 mmol/L (ref 135–145)
Total Bilirubin: 0.4 mg/dL (ref 0.3–1.2)
Total Protein: 6.7 g/dL (ref 6.5–8.1)

## 2018-12-26 LAB — C-REACTIVE PROTEIN: CRP: <0.8 mg/dL (ref <1.0)

## 2018-12-26 LAB — SAVE SMEAR(SSMR), FOR PROVIDER SLIDE REVIEW

## 2018-12-26 LAB — SEDIMENTATION RATE: Sed Rate: 18 mm/hr (ref 0–22)

## 2018-12-29 ENCOUNTER — Telehealth: Payer: Self-pay | Admitting: Hematology

## 2018-12-29 NOTE — Telephone Encounter (Signed)
Return as needed

## 2019-01-15 LAB — JAK2 (INCLUDING V617F AND EXON 12), MPL,& CALR W/RFL MPN PANEL (NGS)

## 2019-01-15 LAB — BCR ABL1 FISH (GENPATH)

## 2019-04-01 ENCOUNTER — Telehealth: Payer: BC Managed Care – PPO | Admitting: Nurse Practitioner

## 2019-04-01 DIAGNOSIS — Z20828 Contact with and (suspected) exposure to other viral communicable diseases: Secondary | ICD-10-CM

## 2019-04-01 DIAGNOSIS — R05 Cough: Secondary | ICD-10-CM

## 2019-04-01 DIAGNOSIS — R059 Cough, unspecified: Secondary | ICD-10-CM

## 2019-04-01 DIAGNOSIS — Z20822 Contact with and (suspected) exposure to covid-19: Secondary | ICD-10-CM

## 2019-04-01 MED ORDER — BENZONATATE 100 MG PO CAPS: 100.0000 mg | ORAL_CAPSULE | Freq: Three times a day (TID) | ORAL | 0 refills | Status: AC | PRN

## 2019-04-01 NOTE — Progress Notes (Signed)
E-Visit for Corona Virus Screening   Your current symptoms could be consistent with the coronavirus.  Many health care providers can now test patients at their office but not all are.  [facility] has multiple testing sites. For information on our COVID testing locations and hours go to [email or web address]  Please quarantine yourself while awaiting your test results.  We are enrolling you in our [facility] for [facility] . Daily you will receive a questionnaire within the [facility] website. Our COVID 19 response team willl be monitoriing your responses daily.    COVID-19 is a respiratory illness with symptoms that are similar to the flu. Symptoms are typically mild to moderate, but there have been cases of severe illness and death due to the virus. The following symptoms may appear 2-14 days after exposure: . Fever . Cough . Shortness of breath or difficulty breathing . Chills . Repeated shaking with chills . Muscle pain . Headache . Sore throat . New loss of taste or smell . Fatigue . Congestion or runny nose . Nausea or vomiting . Diarrhea  It is vitally important that if you feel that you have an infection such as this virus or any other virus that you stay home and away from places where you may spread it to others.  You should self-quarantine for 14 days if you have symptoms that could potentially be coronavirus or have been in close contact a with a person diagnosed with COVID-19 within the last 2 weeks. You should avoid contact with people age 46 and older.   You should wear a mask or cloth face covering over your nose and mouth if you must be around other people or animals, including pets (even at home). Try to stay at least 6 feet away from other people. This will protect the people around you.  You can use medication such as A prescription cough medication called Tessalon Perles 100 mg. You may take 1-2 capsules every 8 hours as needed for  cough- prescription has been sent to pharmacy.  You may also take acetaminophen (Tylenol) as needed for fever.   Reduce your risk of any infection by using the same precautions used for avoiding the common cold or flu:  Marland Kitchen Wash your hands often with soap and warm water for at least 20 seconds.  If soap and water are not readily available, use an alcohol-based hand sanitizer with at least 60% alcohol.  . If coughing or sneezing, cover your mouth and nose by coughing or sneezing into the elbow areas of your shirt or coat, into a tissue or into your sleeve (not your hands). . Avoid shaking hands with others and consider head nods or verbal greetings only. . Avoid touching your eyes, nose, or mouth with unwashed hands.  . Avoid close contact with people who are sick. . Avoid places or events with large numbers of people in one location, like concerts or sporting events. . Carefully consider travel plans you have or are making. . If you are planning any travel outside or inside the [place], visit the CDC's Travelers' Health webpage for the latest health notices. . If you have some symptoms but not all symptoms, continue to monitor at home and seek medical attention if your symptoms worsen. . If you are having a medical emergency, call 911.  HOME CARE . Only take medications as instructed by your medical team. . Drink plenty of fluids and get plenty of rest. . A steam or ultrasonic humidifier can help  if you have congestion.   GET HELP RIGHT AWAY IF YOU HAVE EMERGENCY WARNING SIGNS** FOR COVID-19. If you or someone is showing any of these signs seek emergency medical care immediately. Call 911 or proceed to your closest emergency facility if: . You develop worsening high fever. . Trouble breathing . Bluish lips or face . Persistent pain or pressure in the chest . New confusion . Inability to wake or stay awake . You cough up blood. . Your symptoms become more severe  **This list is not all  possible symptoms. Contact your medical provider for any symptoms that are sever or concerning to you.   MAKE SURE YOU   Understand these instructions.  Will watch your condition.  Will get help right away if you are not doing well or get worse.  Your e-visit answers were reviewed by a board certified advanced clinical practitioner to complete your personal care plan.  Depending on the condition, your plan could have included both over the counter or prescription medications.  If there is a problem please reply once you have received a response from your provider.  Your safety is important to [place].  If you have drug allergies check your prescription carefully.    You can use MyChart to ask questions about today's visit, request a non-urgent call back, or ask for a work or school excuse for 24 hours related to this e-Visit. If it has been greater than 24 hours you will need to follow up with your provider, or enter a new e-Visit to address those concerns. You will get an e-mail in the next two days asking about your experience.  I hope that your e-visit has been valuable and will speed your recovery. Thank you for using e-visits.  5-10 minutes spent reviewing and documenting in chart.

## 2019-04-06 ENCOUNTER — Encounter: Payer: Self-pay | Admitting: Emergency Medicine

## 2019-04-06 ENCOUNTER — Other Ambulatory Visit: Payer: Self-pay

## 2019-04-06 ENCOUNTER — Telehealth (INDEPENDENT_AMBULATORY_CARE_PROVIDER_SITE_OTHER): Payer: BC Managed Care – PPO | Admitting: Emergency Medicine

## 2019-04-06 VITALS — Ht 67.0 in | Wt 168.0 lb

## 2019-04-06 DIAGNOSIS — J22 Unspecified acute lower respiratory infection: Secondary | ICD-10-CM

## 2019-04-06 DIAGNOSIS — R059 Cough, unspecified: Secondary | ICD-10-CM

## 2019-04-06 DIAGNOSIS — R05 Cough: Secondary | ICD-10-CM

## 2019-04-06 MED ORDER — HYDROCODONE-HOMATROPINE 5-1.5 MG/5ML PO SYRP: 5.0000 mL | ORAL_SOLUTION | Freq: Every evening | ORAL | 0 refills | Status: AC | PRN

## 2019-04-06 MED ORDER — AZITHROMYCIN 250 MG PO TABS: ORAL_TABLET | ORAL | 0 refills | Status: AC

## 2019-04-06 NOTE — Progress Notes (Signed)
Called patient to triage for appointment. Patient states on 03/30/2019, a Tuesday she had a scratchy throat, coughing (dry), running nose, body ache, no energy, fever 102.4 with chills. Patient was tested for COVID19, the results were negative. Patient went to the [facility] on 04/03/2019 and diagnosed with pneumonia in the right upper lobe, taking Doxycycline and given cough medication. Patient states she needs something else for the cough.

## 2019-04-06 NOTE — Progress Notes (Signed)
Telemedicine Encounter- SOAP NOTE Established Patient  This telephone encounter was conducted with the patient's (or proxy's) verbal consent via audio telecommunications: yes/no: Yes Patient was instructed to have this encounter in a suitably private space; and to only have persons present to whom they give permission to participate. In addition, patient identity was confirmed by use of name plus two identifiers (DOB and address).  I discussed the limitations, risks, security and privacy concerns of performing an evaluation and management service by telephone and the availability of in person appointments. I also discussed with the patient that there may be a patient responsible charge related to this service. The patient expressed understanding and agreed to proceed.  I spent a total of TIME; 0 MIN TO 60 MIN: 15 minutes talking with the patient or their proxy.  No chief complaint on file. Cough  Subjective   Taylor Knight is a 48 y.o. female established patient. Telephone visit today complaining of 1 week history of fever, chills, body aches, congestion and productive cough.  Tested negative for Covid.  Was diagnosed with pneumonia on 04/03/2019 and started on doxycycline 100 mg twice a day for 7 days and Tessalon p.o.  Today still complaining of cough and symptoms not better.  Denies difficulty breathing or chest pain.  Still running intermittent fevers.  Denies nausea or vomiting.  Denies diarrhea.  No other significant symptoms.  HPI   Patient Active Problem List   Diagnosis Date Noted  . Hypertension 11/18/2018  . Vasculitis ([facility]) 08/11/2017  . Seizure ([facility]) 04/30/2012  . Varicose veins of lower extremities with other complications [id number]    Past Medical History:  Diagnosis Date  . Anxiety   . Depression   . H. pylori infection   . History of concussion    2013  . History of Mallory-Weiss syndrome   . History of panic attacks   . History of seizure    05-01-2012--   idiology, xanax withdrawal seizure  . Severe dysmenorrhea   . Varicose veins    bilateral legs    Current Outpatient Medications  Medication Sig Dispense Refill  . benzonatate (TESSALON PERLES) 100 MG capsule Take 1 capsule (100 mg total) by mouth 3 (three) times daily as needed. 20 capsule 0  . doxycycline (DORYX) 100 MG EC tablet Take 100 mg by mouth 2 (two) times daily.    Marland Kitchen lisinopril (ZESTRIL) 10 MG tablet Take 1 tablet (10 mg total) by mouth daily. 90 tablet 3  . azithromycin (ZITHROMAX) 250 MG tablet Sig as indicated 6 tablet 0  . HYDROcodone-homatropine (HYCODAN) 5-1.5 MG/5ML syrup Take 5 mLs by mouth at bedtime as needed for cough. 120 mL 0  . linaclotide (LINZESS) 290 MCG CAPS capsule TAKE ONE CAPSULE BY MOUTH DAILY BEFORE BREAKFAST (Patient not taking: Reported on 04/06/2019) 30 capsule 4   No current facility-administered medications for this visit.     Allergies  Allergen Reactions  . Penicillins Hives    Has patient had a PCN reaction causing immediate rash, facial/tongue/throat swelling, SOB or lightheadedness with hypotension: Yes Has patient had a PCN reaction causing severe rash involving mucus membranes or skin necrosis: Yes Has patient had a PCN reaction that required hospitalization No Has patient had a PCN reaction occurring within the last 10 years: No  If all of the above answers are "NO", then may proceed with Cephalosporin use.     Social History   Socioeconomic History  . Marital status: Married    Spouse name:  Not on file  . Number of children: 1  . Years of education: Not on file  . Highest education level: Not on file  Occupational History  . Not on file  Social Needs  . Financial resource strain: Not on file  . Food insecurity    Worry: Not on file    Inability: Not on file  . Transportation needs    Medical: Not on file    Non-medical: Not on file  Tobacco Use  . Smoking status: Current Some Day Smoker    Packs/day: 1.00    Years: 10.00     Pack years: 10.00    Types: Cigarettes  . Smokeless tobacco: Never Used  . Tobacco comment: 1pack every three days  Substance and Sexual Activity  . Alcohol use: Yes    Comment: occasional  . Drug use: No  . Sexual activity: Yes    Birth control/protection: None  Lifestyle  . Physical activity    Days per week: Not on file    Minutes per session: Not on file  . Stress: Not on file  Relationships  . Social Herbalist on phone: Not on file    Gets together: Not on file    Attends religious service: Not on file    Active member of club or organization: Not on file    Attends meetings of clubs or organizations: Not on file    Relationship status: Not on file  . Intimate partner violence    Fear of current or ex partner: Not on file    Emotionally abused: Not on file    Physically abused: Not on file    Forced sexual activity: Not on file  Other Topics Concern  . Not on file  Social History Narrative  . Not on file    Review of Systems  Constitutional: Positive for malaise/fatigue. Negative for chills and fever.  HENT: Positive for congestion. Negative for sore throat.   Respiratory: Positive for cough and sputum production. Negative for shortness of breath and wheezing.   Cardiovascular: Negative.  Negative for chest pain and palpitations.  Gastrointestinal: Negative for abdominal pain, diarrhea, nausea and vomiting.  Genitourinary: Negative.  Negative for dysuria and hematuria.  Musculoskeletal: Positive for myalgias.  Skin: Negative.  Negative for rash.  Neurological: Positive for headaches. Negative for dizziness.  All other systems reviewed and are negative.   Objective  Alert and oriented x3 in no apparent respiratory distress. Vitals as reported by the patient: Today's Vitals   04/06/19 1208  Weight: 168 lb (76.2 kg)  Height: 5\' 7"  (1.702 m)    Diagnoses and all orders for this visit:  Cough -     HYDROcodone-homatropine (HYCODAN) 5-1.5  MG/5ML syrup; Take 5 mLs by mouth at bedtime as needed for cough.  Lower respiratory infection -     azithromycin (ZITHROMAX) 250 MG tablet; Sig as indicated  Clinically stable.  No red flag signs or symptoms.  ED precautions given. Covid precautions given.  Advised to contact the office if no better in the next 48 to 72 hours.   I discussed the assessment and treatment plan with the patient. The patient was provided an opportunity to ask questions and all were answered. The patient agreed with the plan and demonstrated an understanding of the instructions.   The patient was advised to call back or seek an in-person evaluation if the symptoms worsen or if the condition fails to improve as anticipated.  I provided  15 minutes of non-face-to-face time during this encounter.  Horald Pollen, MD  Primary Care at [facility]

## 2019-04-08 ENCOUNTER — Ambulatory Visit: Payer: BC Managed Care – PPO | Admitting: Emergency Medicine

## 2019-10-31 ENCOUNTER — Other Ambulatory Visit: Payer: Self-pay | Admitting: Family Medicine

## 2021-06-15 ENCOUNTER — Other Ambulatory Visit: Payer: Self-pay

## 2021-06-15 ENCOUNTER — Emergency Department (HOSPITAL_BASED_OUTPATIENT_CLINIC_OR_DEPARTMENT_OTHER)
Admission: EM | Admit: 2021-06-15 | Discharge: 2021-06-15 | Disposition: A | Discharge status: Home / Self Care | Payer: BC Managed Care – PPO | Attending: Emergency Medicine | Admitting: Emergency Medicine

## 2021-06-15 ENCOUNTER — Encounter (HOSPITAL_BASED_OUTPATIENT_CLINIC_OR_DEPARTMENT_OTHER): Payer: Self-pay

## 2021-06-15 DIAGNOSIS — M6289 Other specified disorders of muscle: Secondary | ICD-10-CM | POA: Diagnosis not present

## 2021-06-15 DIAGNOSIS — Z20822 Contact with and (suspected) exposure to covid-19: Secondary | ICD-10-CM | POA: Diagnosis not present

## 2021-06-15 DIAGNOSIS — R599 Enlarged lymph nodes, unspecified: Secondary | ICD-10-CM | POA: Diagnosis present

## 2021-06-15 DIAGNOSIS — M7912 Myalgia of auxiliary muscles, head and neck: Secondary | ICD-10-CM

## 2021-06-15 LAB — RESP PANEL BY RT-PCR (FLU A&B, COVID) ARPGX2
Influenza A by PCR: NEGATIVE
Influenza B by PCR: NEGATIVE
SARS Coronavirus 2 by RT PCR: NEGATIVE

## 2021-06-15 MED ORDER — METHOCARBAMOL 500 MG PO TABS: 500.0000 mg | ORAL_TABLET | Freq: Two times a day (BID) | ORAL | 0 refills | Status: DC

## 2021-06-15 MED ORDER — LIDOCAINE 5 % EX PTCH: 1.0000 | MEDICATED_PATCH | CUTANEOUS | 0 refills | Status: AC

## 2021-06-15 MED ORDER — IBUPROFEN 800 MG PO TABS: 800.0000 mg | ORAL_TABLET | Freq: Three times a day (TID) | ORAL | 0 refills | Status: AC

## 2021-06-15 NOTE — ED Notes (Signed)
Dec 22nd began having nasal congestion and jaw pain, rec medications from her MD . Got relief, this past Monday began having the left sided jaw pain and took Ibuprofen. Not having any relief. Has some pain when attempting to open mouth widely, has some difficulty swallowing. Pain is primarily located at posterior area of left ear lobe. Speech is normal, has clear tracheal sounds as well, airway intact

## 2021-06-15 NOTE — ED Provider Notes (Signed)
[place] EMERGENCY DEPT Provider Note   CSN: [id number] Arrival date & time: 06/15/21  1124     History  Chief Complaint  Patient presents with   Lymphadenopathy    Taylor Knight is a 51 y.o. female presenting with pain to the left side of her neck.  Reports that in December she had some pain in the setting of sore throat, cough and URI symptoms.  She was treated with some ibuprofen and that helped her pain.  She has been using ibuprofen at this time, 800 mg, and reports that it seems like it wears off sooner.  Her pain is worse when she is chewing or swallowing.  No fevers or chills.  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  Does not like to take Tylenol because it upsets her stomach.  Recently saw a dentist, no concern for cavities or abscess.   HPI     Home Medications Prior to Admission medications   Medication Sig Start Date End Date Taking? Authorizing Provider  ibuprofen (ADVIL) 800 MG tablet Take 1 tablet (800 mg total) by mouth 3 (three) times daily. 06/15/21  Yes Everlina Gotts A, PA-C  lidocaine (LIDODERM) 5 % Place 1 patch onto the skin daily. Remove & Discard patch within 12 hours or as directed by MD 06/15/21  Yes Aadil Sur A, PA-C  methocarbamol (ROBAXIN) 500 MG tablet Take 1 tablet (500 mg total) by mouth 2 (two) times daily. 06/15/21  Yes Brandyn Thien A, PA-C  azithromycin (ZITHROMAX) 250 MG tablet Sig as indicated 04/06/19   Horald Pollen, MD  benzonatate (TESSALON PERLES) 100 MG capsule Take 1 capsule (100 mg total) by mouth 3 (three) times daily as needed. 04/01/19   Hassell Done, Mary-Margaret, FNP  doxycycline (DORYX) 100 MG EC tablet Take 100 mg by mouth 2 (two) times daily.    [provider]  HYDROcodone-homatropine (HYCODAN) 5-1.5 MG/5ML syrup Take 5 mLs by mouth at bedtime as needed for cough. 04/06/19   Horald Pollen, MD  linaclotide [facility] [place]  capsule TAKE ONE CAPSULE BY MOUTH DAILY BEFORE BREAKFAST Patient not taking: Reported on 04/06/2019 05/20/17   Doran Stabler, MD  lisinopril (ZESTRIL) 10 MG tablet Take 1 tablet (10 mg total) by mouth daily. 11/18/18   Maryruth Hancock, MD      Allergies    Penicillins    Review of Systems   Review of Systems  Physical Exam Updated Vital Signs BP 134/88 (BP Location: Right Arm)    Pulse 65    Temp 97.8 F (36.6 C)    Resp 16    Ht 5' 6.5" (1.689 m)    Wt 78.9 kg    LMP 06/09/2015 (Exact Date)    SpO2 99%    BMI 27.66 kg/m  Physical Exam Vitals and nursing note reviewed.  Constitutional:      Appearance: Normal appearance.  HENT:     Head: Normocephalic and atraumatic.     Mouth/Throat:     Mouth: Mucous membranes are moist.     Pharynx: Oropharynx is clear. No posterior oropharyngeal erythema.     Comments: Teeth good dentition, no notable caries or abscess.  Airway clear, tolerating secretions. Eyes:     General: No scleral icterus.    Conjunctiva/sclera: Conjunctivae normal.  Neck:     Comments: Tenderness to the SCM of the left side of the neck.  No lymphadenopathy.  No signs of ear infection. Pulmonary:     Effort: Pulmonary effort is normal. No respiratory distress.  Musculoskeletal:     Cervical back: Tenderness present.  Skin:    Findings: No rash.  Neurological:     Mental Status: She is alert.  Psychiatric:        Mood and Affect: Mood normal.    ED Results / Procedures / Treatments   Labs (all labs ordered are listed, but only abnormal results are displayed) Labs Reviewed  RESP PANEL BY RT-PCR (FLU A&B, COVID) [id number]    EKG None  Radiology No results found.  Procedures Procedures    Medications Ordered in ED Medications - No data to display  ED Course/ Medical Decision Making/ A&P                           Medical Decision Making  51 year old female presenting today with a complaint of left-sided neck pain.  No inflamed lymph nodes.  In  December she thought that this was due to a URI and resolved with ibuprofen however now her ibuprofen is no longer lasting 8 hours, but only 4.  On physical exam, pain is located to the clavicular head of the SCM.  No lymphadenopathy.  Airway clear, tolerating secretions.  I believe patient's discomfort to be musculoskeletal,.  Initially in the setting of poor posture during work.  She has a Restaurant manager, fast food that she likes to see however has not followed up with.  Also does not have a primary care provider so she has been given information about how to establish continuity of care instead of presenting to the emergency department.  Patient will be giving muscle relaxants, lidocaine patches and a refill of her ibuprofen.  She is agreeable to this plan and stable for discharge at this time.          Final Clinical Impression(s) / ED Diagnoses Final diagnoses:  Sternocleidomastoid muscle tenderness    Rx / DC Orders ED Discharge Orders          Ordered    methocarbamol (ROBAXIN) 500 MG tablet  2 times daily        06/15/21 1446    lidocaine (LIDODERM) 5 %  Every 24 hours        06/15/21 1446    ibuprofen (ADVIL) 800 MG tablet  3 times daily        06/15/21 1446              Lizabeth Fellner, Fortuna A, PA-C 06/15/21 1521    Lorelle Gibbs, DO 06/15/21 1539

## 2021-06-15 NOTE — Discharge Instructions (Addendum)
I have sent the muscle relaxants, refill of your ibuprofen and lidocaine patches to your pharmacy.  You may put the lidocaine patch on the area and see if it helps with your discomfort.  You may only use these for 12 hours at a time, make sure you have 12 hours patch free each day.  Please follow-up with the primary care clinic attached to these discharge papers for you to get established for a continuation of care.

## 2021-06-15 NOTE — ED Triage Notes (Signed)
Patient here POV from Home for Lymphadenopathy.   Patient visited UC in December for Lymphadenopathy. Symptoms subsided with Antibiotic and Ibuprofen but presented again on Monday. Patient called UC and was referred to ED for Evaluation.   NAD Noted during Triage. A&Ox4. GCS 15. Ambulatory. No Oral or Airway Obstruction.

## 2021-08-21 ENCOUNTER — Encounter: Payer: Self-pay | Admitting: Gastroenterology

## 2024-01-04 ENCOUNTER — Emergency Department (HOSPITAL_COMMUNITY)
Admission: EM | Admit: 2024-01-04 | Discharge: 2024-01-04 | Disposition: A | Discharge status: Home / Self Care | Attending: Emergency Medicine | Admitting: Emergency Medicine

## 2024-01-04 ENCOUNTER — Encounter (HOSPITAL_COMMUNITY): Payer: Self-pay

## 2024-01-04 ENCOUNTER — Emergency Department (HOSPITAL_COMMUNITY)

## 2024-01-04 ENCOUNTER — Other Ambulatory Visit: Payer: Self-pay

## 2024-01-04 DIAGNOSIS — M549 Dorsalgia, unspecified: Secondary | ICD-10-CM

## 2024-01-04 DIAGNOSIS — M545 Low back pain, unspecified: Secondary | ICD-10-CM | POA: Insufficient documentation

## 2024-01-04 DIAGNOSIS — R071 Chest pain on breathing: Secondary | ICD-10-CM | POA: Diagnosis not present

## 2024-01-04 DIAGNOSIS — R0781 Pleurodynia: Secondary | ICD-10-CM

## 2024-01-04 DIAGNOSIS — G8918 Other acute postprocedural pain: Secondary | ICD-10-CM | POA: Diagnosis not present

## 2024-01-04 LAB — CBC
HCT: 44.9 % (ref 36.0–46.0)
Hemoglobin: 15.6 g/dL — ABNORMAL HIGH (ref 12.0–15.0)
MCH: 31.1 pg (ref 26.0–34.0)
MCHC: 34.7 g/dL (ref 30.0–36.0)
MCV: 89.6 fL (ref 80.0–100.0)
Platelets: 315 K/uL (ref 150–400)
RBC: 5.01 MIL/uL (ref 3.87–5.11)
RDW: 12.2 % (ref 11.5–15.5)
WBC: 10.8 K/uL — ABNORMAL HIGH (ref 4.0–10.5)
nRBC: 0 % (ref 0.0–0.2)

## 2024-01-04 LAB — BASIC METABOLIC PANEL WITH GFR
Anion gap: 8 (ref 5–15)
BUN: 12 mg/dL (ref 6–20)
CO2: 25 mmol/L (ref 22–32)
Calcium: 9.7 mg/dL (ref 8.9–10.3)
Chloride: 106 mmol/L (ref 98–111)
Creatinine, Ser: 0.68 mg/dL (ref 0.44–1.00)
GFR, Estimated: >60 mL/min (ref >60)
Glucose, Bld: 90 mg/dL (ref 70–99)
Potassium: 3.4 mmol/L — ABNORMAL LOW (ref 3.5–5.1)
Sodium: 139 mmol/L (ref 135–145)

## 2024-01-04 LAB — TROPONIN I (HIGH SENSITIVITY): Troponin I (High Sensitivity): <2 ng/L (ref <18)

## 2024-01-04 LAB — D-DIMER, QUANTITATIVE: D-Dimer, Quant: 0.52 ug{FEU}/mL — ABNORMAL HIGH (ref 0.00–0.50)

## 2024-01-04 MED ORDER — METHOCARBAMOL 500 MG PO TABS
500.0000 mg | ORAL_TABLET | Freq: Once | ORAL | Status: AC
  Administered 2024-01-04: 500 mg via ORAL
  Filled 2024-01-04: qty 1

## 2024-01-04 MED ORDER — METHOCARBAMOL 500 MG PO TABS
500.0000 mg | ORAL_TABLET | Freq: Two times a day (BID) | ORAL | 0 refills | Status: AC
Start: 2024-01-04 — End: ?

## 2024-01-04 MED ORDER — OXYCODONE HCL 5 MG PO TABS
5.0000 mg | ORAL_TABLET | Freq: Once | ORAL | Status: AC
  Administered 2024-01-04: 5 mg via ORAL
  Filled 2024-01-04: qty 1

## 2024-01-04 MED ORDER — ACETAMINOPHEN 500 MG PO TABS
1000.0000 mg | ORAL_TABLET | Freq: Once | ORAL | Status: AC
  Administered 2024-01-04: 1000 mg via ORAL
  Filled 2024-01-04: qty 2

## 2024-01-04 MED ORDER — KETOROLAC TROMETHAMINE 15 MG/ML IJ SOLN
15.0000 mg | Freq: Once | INTRAMUSCULAR | Status: DC
  Filled 2024-01-04: qty 1

## 2024-01-04 NOTE — Discharge Instructions (Signed)
 Thank you for coming to [facility] Emergency Department. You were seen for back pain. We did an exam, labs, and imaging, and these showed no acute findings. We have prescribed robaxin  ( a muscle relaxer ) to take 500 mg twice per day as needed for pain. Heat may also help. You can take tylenol  1,000 mg every 8 hours as well. Please follow up with your surgeon on Monday.   Return to the ED if you develop any of the following: - Fever (100.4 F or 38 C) or chills at home that do not respond to over the counter medications - Weakness, numbness, or tingling in your extremities - Difficulty emptying bladder / urinary incontinence - Fecal incontinence - Uncontrolled nausea/vomiting with inability to keep down liquids - Feeling as though you are going to pass out or passing out - Anything else that concerns you

## 2024-01-04 NOTE — ED Notes (Signed)
 PT REFUSED TO PROVIDE A URINE SAMPLE SHE STATES THAT SHE IS TIRED AND WANTS TO GO HOME

## 2024-01-04 NOTE — ED Notes (Signed)
 Discharge instructions reviewed with patient. Patient questions answered and opportunity for education reviewed. Patient voices understanding of discharge instructions with no further questions. Patient ambulatory with steady gait to lobby.

## 2024-01-04 NOTE — ED Triage Notes (Signed)
 Pt reports low back pain radiating to abdomen. Pt reports recent back surgery. Pt also endorses CP with inspiration.

## 2024-01-04 NOTE — ED Provider Notes (Signed)
 [place] EMERGENCY DEPARTMENT AT [facility] Provider Note   CSN: [id number] Arrival date & time: 01/04/24  1539     History  Chief Complaint  Patient presents with   Chest Pain    Taylor Knight is a 53 y.o. female with PMH as listed below who presents with lower back pain.   Patient has a history of chronic diffuse lower back pain who on 717 received a ReActiv8 implant at [facility] outpatient surgical center with her pain management physician.  She had postop follow-up on 12/30/2023 where she noted moderate postoperative pain especially in the left lower back and buttock region which was nonradiating.  Her indication was dysfunction of the multifidus muscle of the lumbar region.  Physician mention that it would likely take several months before she felt better as the multifidus muscles strengthen, but she is not going to follow-up until 01/06/2024 for activation of the device.  So the device is currently implanted but not yet activated.  Patient states that earlier today she began to have worsening and severe pain in the left lower back, especially with deep breaths.  To clarify from the triage note, she has no chest pain with deep breaths, rather lower back pain that occurs primarily with deep breathing as well as sitting and twisting. The pain goes up into her bilateral flanks. She states the pain does go down her left buttock which is not necessarily new for her.  She denies any urinary symptoms, vaginal symptoms, abdominal pain, nausea vomiting diarrhea constipation, chest pain, shortness of breath, cough, hemoptysis, fever/chills.  She states that the surgical site looks the same as it has and she has not noticed any increased redness, swelling, or pus drainage.  She denies any urinary or bowel incontinence, saddle anesthesia, history of IVDU or cancer. She had been taking hte post-operative oxycodone  but has run out.    Past Medical History:  Diagnosis Date   Anxiety     Depression    H. pylori infection    History of concussion    2013   History of Mallory-Weiss syndrome    History of panic attacks    History of seizure    05-01-2012--  idiology, xanax  withdrawal seizure   Severe dysmenorrhea    Varicose veins    bilateral legs       Home Medications Prior to Admission medications   Medication Sig Start Date End Date Taking? Authorizing Provider  azithromycin  (ZITHROMAX ) 250 MG tablet Sig as indicated 04/06/19   Purcell Emil Schanz, MD  benzonatate  (TESSALON  PERLES) 100 MG capsule Take 1 capsule (100 mg total) by mouth 3 (three) times daily as needed. 04/01/19   Gladis, Mary-Margaret, FNP  doxycycline (DORYX) 100 MG EC tablet Take 100 mg by mouth 2 (two) times daily.    [provider]  HYDROcodone -homatropine (HYCODAN) 5-1.5 MG/5ML syrup Take 5 mLs by mouth at bedtime as needed for cough. 04/06/19   Purcell Emil Schanz, MD  ibuprofen  (ADVIL ) 800 MG tablet Take 1 tablet (800 mg total) by mouth 3 (three) times daily. 06/15/21   Redwine, Madison A, PA-C  lidocaine  (LIDODERM ) 5 % Place 1 patch onto the skin daily. Remove & Discard patch within 12 hours or as directed by MD 06/15/21   Redwine, Madison A, PA-C  linaclotide  (LINZESS ) 290 MCG CAPS capsule TAKE ONE CAPSULE BY MOUTH DAILY BEFORE BREAKFAST Patient not taking: Reported on 04/06/2019 05/20/17   Legrand Victory LITTIE DOUGLAS, MD  lisinopril  (ZESTRIL ) 10 MG tablet Take  1 tablet (10 mg total) by mouth daily. 11/18/18   Corum, Olam CROME, MD  methocarbamol  (ROBAXIN ) 500 MG tablet Take 1 tablet (500 mg total) by mouth 2 (two) times daily. 06/15/21   Redwine, Madison A, PA-C      Allergies    Penicillins    Review of Systems   Review of Systems A 10 point review of systems was performed and is negative unless otherwise reported in HPI.  Physical Exam Updated Vital Signs BP 131/84 (BP Location: Right Arm)   Pulse 74   Temp 97.8 F (36.6 C) (Oral)   Resp 18   Ht 5' 6 (1.676 m)   Wt 81.6 kg   LMP  06/09/2015 (Exact Date)   SpO2 99%   BMI 29.05 kg/m  Physical Exam General: Normal appearing female, standing by the bed.  HEENT: PERRLA, Sclera anicteric, MMM, trachea midline.  Cardiology: RRR, no murmurs/rubs/gallops.  Resp: Normal respiratory rate and effort. CTAB, no wheezes, rhonchi, crackles.  Abd: Soft, non-tender, non-distended. No rebound tenderness or guarding.  GU: Deferred. MSK: No peripheral edema or signs of trauma. Extremities without deformity or TTP.  Skin: warm, dry.  Back: No CVA tenderness. 5 cm well-healing surgical incision in the midline lumbar region with no surrounding erythema, induration, fluctuance, or purulent drainage. Mild TTP over surgical site. Moderate paraspinal lumbar muscle tightness and TTP.  Neuro: A&Ox4, CNs II-XII grossly intact. MAEs. Sensation grossly intact. Normal gait.   Psych: Normal mood and affect.   ED Results / Procedures / Treatments   Labs (all labs ordered are listed, but only abnormal results are displayed) Labs Reviewed  BASIC METABOLIC PANEL WITH GFR - Abnormal; Notable for the following components:      Result Value   Potassium 3.4 (*)    All other components within normal limits  CBC - Abnormal; Notable for the following components:   WBC 10.8 (*)    Hemoglobin 15.6 (*)    All other components within normal limits  TROPONIN I (HIGH SENSITIVITY)  TROPONIN I (HIGH SENSITIVITY)    EKG EKG Interpretation Date/Time:  Saturday January 04 2024 16:03:35 EDT Ventricular Rate:  73 PR Interval:  136 QRS Duration:  90 QT Interval:  388 QTC Calculation: 428 R Axis:   66  Text Interpretation: Sinus rhythm Confirmed by Franklyn Gills [phone]) on 01/04/2024 5:38:12 PM  Radiology DG Chest 2 View Result Date: 01/04/2024 CLINICAL DATA:  Low back pain radiating to abdomen, chest pain with inspiration EXAM: CHEST - 2 VIEW COMPARISON:  12/10/2017 FINDINGS: Frontal and lateral views of the chest demonstrate an unremarkable cardiac  silhouette. No acute airspace disease, effusion, or pneumothorax. No acute bony abnormalities. IMPRESSION: 1. No acute intrathoracic process. Electronically Signed   By: Ozell Daring M.D.   On: 01/04/2024 16:34    Procedures Procedures    Medications Ordered in ED Medications  oxyCODONE  (Oxy IR/ROXICODONE ) immediate release tablet 5 mg (5 mg Oral Given 01/04/24 1939)  methocarbamol  (ROBAXIN ) tablet 500 mg (500 mg Oral Given 01/04/24 1939)  acetaminophen  (TYLENOL ) tablet 1,000 mg (1,000 mg Oral Given 01/04/24 1939)    ED Course/ Medical Decision Making/ A&P                          Medical Decision Making Amount and/or Complexity of Data Reviewed Labs: ordered. Decision-making details documented in ED Course. Radiology: ordered. Decision-making details documented in ED Course.  Risk OTC drugs. Prescription drug management.  This patient presents to the ED for concern of lower back pain, this involves an extensive number of treatment options, and is a complaint that carries with it a high risk of complications and morbidity.  I considered the following differential and admission for this acute, potentially life threatening condition. Patient is overall well-appearing and HDS.   MDM:    DDX for low back pain includes but is not limited to:   Consider MSK pain as presenting etiology and likely post-operative muscular tightness and pain with possible sciatica. She has no signs of surgical site infection. Considered internal infection or spinal epidural abscess, epidural hematoma, but patient has no FNDs. Presentation not consistent with  fracture (no trauma, no bony tenderness to palpation), cauda equina (no bowel or urinary incontinence/retention, no saddle anesthesia, no distal weakness), AAA, viscus perforation, renal colic, pyelonephritis (afebrile, no CVAT, no urinary symptoms). Given the clinical picture, no indication for imaging at this time. With patient's pleuritic flank pain and  recent surgery, must consider PE. Reassuringly she has no CP/SOB/cough/hypoxia, and her d-dimer is negative. A troponin ordered from triage is negative and CXR is negative. Gave patient tyelnol, oxycodone , robaxin , with some improvement in her sxs.    Clinical Course as of 01/06/24 2226  Sat Jan 04, 2024  1738 DG Chest 2 View No acute intrathoracic process. [HN]  1738 Troponin I (High Sensitivity): <2 neg [HN]  1945 D-Dimer, Quant(!): 0.52 Negative by years criteria [HN]  1947 Patient is refusing a urine sample [HN]  2003 Patients tates she is ready to be discharged. She just took medicationa bout 10 minutes ago but would like to go home. She states she will see her surgeon on Monday and hopefully have the implant activated for pain relief. She understands that we cannot rule out a UTI without a urine sample and understands the risks of not having a urine sample. Recommended tylenol , heat, and robaxin , and provided an rx for robaxin . Advised to call surgeon on Monday morning. DC w/ discharge instructions/return precautions. All questions answered to patient's satisfaction.   [HN]    Clinical Course User Index [HN] Franklyn Sid SAILOR, MD    Labs: I Ordered, and personally interpreted labs.  The pertinent results include:  those listed above  Imaging Studies ordered: CXR ordered from triage I independently visualized and interpreted imaging. I agree with the radiologist interpretation  Additional history obtained from chart review, husband at bedside.  External records from outside source obtained and reviewed including [facility] [facility] anesthesiology  Reevaluation: After the interventions noted above, I reevaluated the patient and found that they have :improved  Social Determinants of Health: Lives independently  Disposition:  DC w/ discharge instructions/return precautions. All questions answered to patient's satisfaction.    Co morbidities that complicate the patient  evaluation  Past Medical History:  Diagnosis Date   Anxiety    Depression    H. pylori infection    History of concussion    2013   History of Mallory-Weiss syndrome    History of panic attacks    History of seizure    05-01-2012--  idiology, xanax  withdrawal seizure   Severe dysmenorrhea    Varicose veins    bilateral legs     Medicines No orders of the defined types were placed in this encounter.   I have reviewed the patients home medicines and have made adjustments as needed  Problem List / ED Course: Problem List Items Addressed This Visit   None Visit Diagnoses  Postoperative back pain    -  Primary   Relevant Medications   oxyCODONE  (Oxy IR/ROXICODONE ) immediate release tablet 5 mg (Completed)   methocarbamol  (ROBAXIN ) tablet 500 mg (Completed)   acetaminophen  (TYLENOL ) tablet 1,000 mg (Completed)   methocarbamol  (ROBAXIN ) 500 MG tablet     Pleuritic pain                       This note was created using dictation software, which may contain spelling or grammatical errors.    Franklyn Sid SAILOR, MD 01/11/24 1630

## 2024-01-04 NOTE — ED Notes (Signed)
 Pt requesting to speak with MD before getting an IV.

## 2024-05-06 ENCOUNTER — Encounter: Payer: Self-pay | Admitting: Neurology

## 2024-05-07 ENCOUNTER — Other Ambulatory Visit: Payer: Self-pay

## 2024-05-07 DIAGNOSIS — R202 Paresthesia of skin: Secondary | ICD-10-CM

## 2024-06-25 ENCOUNTER — Ambulatory Visit: Admitting: Neurology

## 2024-06-25 DIAGNOSIS — R202 Paresthesia of skin: Secondary | ICD-10-CM

## 2024-06-25 NOTE — Procedures (Signed)
 " [facility]  [address] [place], [address]  [place], [place] [address] Tel: [phone] Fax: [phone] Test Date:  06/25/2024  Patient: Taylor Knight DOB: Aug 18, 1970 Physician: Tonita Blanch, DO  Sex: Female Height: 5' 6 Ref Phys: Gerard Beck, NP  ID#: [id number]   Technician:    History: This is a 54 year old female with history of cervical surgery referred for evaluation of bilateral upper extremity paresthesias.  NCV & EMG Findings: Extensive electrodiagnostic testing of the right upper extremity and additional studies of the left shows: Bilateral median, ulnar, and mixed palmar sensory responses are within normal limits. Bilateral median and ulnar motor responses are within normal limits. There is no evidence of active or chronic motor axonal loss changes affecting any of the tested muscles.  Motor unit configuration and recruitment pattern is within normal limits.  Impression: This is a normal study of the upper extremities.  In particular, there is no evidence of carpal tunnel syndrome or a cervical radiculopathy.   ___________________________ Tonita Blanch, DO    Nerve Conduction Studies   Stim Site NR Peak (ms) Norm Peak (ms) O-P Amp (V) Norm O-P Amp  Left Median Anti Sensory (2nd Digit)  32 C  Wrist    3.0 <3.6 57.9 >15  Right Median Anti Sensory (2nd Digit)  32 C  Wrist    2.8 <3.6 54.1 >15  Left Ulnar Anti Sensory (5th Digit)  32 C  Wrist    2.9 <3.1 42.8 >10  Right Ulnar Anti Sensory (5th Digit)  32 C  Wrist    2.8 <3.1 49.2 >10     Stim Site NR Onset (ms) Norm Onset (ms) O-P Amp (mV) Norm O-P Amp Site1 Site2 Delta-0 (ms) Dist (cm) Vel (m/s) Norm Vel (m/s)  Left Median Motor (Abd Poll Brev)  32 C  Wrist    3.0 <4.0 11.0 >6 Elbow Wrist 5.2 29.0 56 >50  Elbow    8.2  10.8         Right Median Motor (Abd Poll Brev)  32 C  Wrist    2.8 <4.0 9.1 >6 Elbow Wrist 4.6 29.0 63 >50  Elbow    7.4  8.3         Left Ulnar Motor (Abd Dig Minimi)  32 C   Wrist    2.7 <3.1 12.5 >7 B Elbow Wrist 3.3 19.0 58 >50  B Elbow    6.0  11.9  A Elbow B Elbow 1.7 10.0 59 >50  A Elbow    7.7  11.2         Right Ulnar Motor (Abd Dig Minimi)  32 C  Wrist    2.3 <3.1 12.5 >7 B Elbow Wrist 3.2 19.0 59 >50  B Elbow    5.5  11.4  A Elbow B Elbow 1.6 10.0 63 >50  A Elbow    7.1  11.3            Stim Site NR Peak (ms) Norm Peak (ms) P-T Amp (V) Site1 Site2 Delta-P (ms) Norm Delta (ms)  Left Median/Ulnar Palm Comparison (Wrist - 8cm)  32 C  Median Palm    1.4 <2.2 141.4   0.2   Ulnar Palm    1.6  39.3      Right Median/Ulnar Palm Comparison (Wrist - 8cm)  32 C  Median Palm    1.4 <2.2 111.8 Median Palm Ulnar Palm 0.1   Ulnar Palm    1.5 <2.2 18.9  Electromyography   Side Muscle Ins.Act Fibs Fasc Recrt Amp Dur Poly Activation Comment  Right 1stDorInt Nml Nml Nml Nml Nml Nml Nml Nml N/A  Right PronatorTeres Nml Nml Nml Nml Nml Nml Nml Nml N/A  Right Biceps Nml Nml Nml Nml Nml Nml Nml Nml N/A  Right Triceps Nml Nml Nml Nml Nml Nml Nml Nml N/A  Right Deltoid Nml Nml Nml Nml Nml Nml Nml Nml N/A  Left 1stDorInt Nml Nml Nml Nml Nml Nml Nml Nml N/A  Left PronatorTeres Nml Nml Nml Nml Nml Nml Nml Nml N/A  Left Biceps Nml Nml Nml Nml Nml Nml Nml Nml N/A  Left Triceps Nml Nml Nml Nml Nml Nml Nml Nml N/A  Left Deltoid Nml Nml Nml Nml Nml Nml Nml Nml N/A      Waveforms:                         "

## 2024-07-03 ENCOUNTER — Ambulatory Visit (INDEPENDENT_AMBULATORY_CARE_PROVIDER_SITE_OTHER): Admitting: Neurology

## 2024-07-03 DIAGNOSIS — R202 Paresthesia of skin: Secondary | ICD-10-CM

## 2024-07-03 NOTE — Procedures (Signed)
 " [facility]  [address] [place], [address]  [place], [place] [address] Tel: [phone] Fax: [phone] Test Date:  07/03/2024  Patient: Taylor Knight DOB: [birth year] Physician: Tonita Blanch, DO  Sex: Female Height: 5' 6 Ref Phys: Gerard Beck, NP  ID#: [id number]   Technician:    History: This is a 54 year old female referred for evaluation of bilateral lower extremity paresthesias.  NCV & EMG Findings: Electrodiagnostic testing of the right lower extremity and additional studies of the left shows: Bilateral sural and superficial peroneal sensory responses are within normal limits. Bilateral peroneal and tibial motor responses are within normal limits. Bilateral tibial H reflex studies are within normal limits. There is no evidence of active or chronic motor axonal changes affecting any of the tested muscles.  Motor unit configuration and recruitment pattern is within normal limits.  Impression: This is a normal study of the lower extremities.  In particular, there is no evidence of a large fiber sensorimotor polyneuropathy or lumbosacral radiculopathy.   ___________________________ Tonita Blanch, DO    Nerve Conduction Studies   Stim Site NR Peak (ms) Norm Peak (ms) O-P Amp (V) Norm O-P Amp  Left Sup Peroneal Anti Sensory (Ant Lat Mall)  32 C  12 cm    2.4 <4.6 11.3 >4  Right Sup Peroneal Anti Sensory (Ant Lat Mall)  32 C  12 cm    3.0 <4.6 12.3 >4  Left Sural Anti Sensory (Lat Mall)  32 C  Calf    2.8 <4.6 32.3 >4  Right Sural Anti Sensory (Lat Mall)  32 C  Calf    3.0 <4.6 33.8 >4     Stim Site NR Onset (ms) Norm Onset (ms) O-P Amp (mV) Norm O-P Amp Site1 Site2 Delta-0 (ms) Dist (cm) Vel (m/s) Norm Vel (m/s)  Left Peroneal Motor (Ext Dig Brev)  32 C  Ankle    3.7 <6.0 4.2 >2.5 B Fib Ankle 7.2 35.0 49 >40  B Fib    10.9  4.1  Poplt B Fib 1.2 7.0 58 >40  Poplt    12.1  4.0         Right Peroneal Motor (Ext Dig Brev)  32 C  Ankle    3.5 <6.0 4.6  >2.5 B Fib Ankle 7.6 35.0 46 >40  B Fib    11.1  3.7  Poplt B Fib 1.3 7.0 54 >40  Poplt    12.4  3.6         Left Tibial Motor (Abd Hall Brev)  32 C  Ankle    4.8 <6.0 14.0 >4 Knee Ankle 9.1 40.0 44 >40  Knee    13.9  8.0         Right Tibial Motor (Abd Hall Brev)  32 C  Ankle    4.3 <6.0 13.4 >4 Knee Ankle 9.6 41.0 43 >40  Knee    13.9  9.6          Electromyography   Side Muscle Ins.Act Fibs Fasc Recrt Amp Dur Poly Activation Comment  Right AntTibialis Nml Nml Nml Nml Nml Nml Nml Nml N/A  Right Gastroc Nml Nml Nml Nml Nml Nml Nml Nml N/A  Right Flex Dig Long Nml Nml Nml Nml Nml Nml Nml Nml N/A  Right RectFemoris Nml Nml Nml Nml Nml Nml Nml Nml N/A  Right GluteusMed Nml Nml Nml Nml Nml Nml Nml Nml N/A  Left AntTibialis Nml Nml Nml Nml Nml Nml Nml Nml N/A  Left  Gastroc Nml Nml Nml Nml Nml Nml Nml Nml N/A  Left Flex Dig Long Nml Nml Nml Nml Nml Nml Nml Nml N/A  Left RectFemoris Nml Nml Nml Nml Nml Nml Nml Nml N/A  Left GluteusMed Nml Nml Nml Nml Nml Nml Nml Nml N/A      Waveforms:                         "

## 2024-07-23 ENCOUNTER — Encounter: Admitting: Neurology

## 2024-07-30 ENCOUNTER — Encounter: Admitting: Neurology
